# Patient Record
Sex: Female | Born: 1955 | Race: White | Hispanic: No | Marital: Married | Smoking: Never smoker
Health system: Southern US, Community
[De-identification: ages and names within clinical notes are randomized; demographics above are authoritative.]

## PROBLEM LIST (undated history)

## (undated) DIAGNOSIS — E785 Hyperlipidemia, unspecified: Secondary | ICD-10-CM

## (undated) DIAGNOSIS — Z87448 Personal history of other diseases of urinary system: Secondary | ICD-10-CM

## (undated) DIAGNOSIS — M810 Age-related osteoporosis without current pathological fracture: Secondary | ICD-10-CM

## (undated) DIAGNOSIS — R0789 Other chest pain: Secondary | ICD-10-CM

## (undated) DIAGNOSIS — I499 Cardiac arrhythmia, unspecified: Secondary | ICD-10-CM

## (undated) DIAGNOSIS — I2510 Atherosclerotic heart disease of native coronary artery without angina pectoris: Secondary | ICD-10-CM

## (undated) DIAGNOSIS — Z8489 Family history of other specified conditions: Secondary | ICD-10-CM

## (undated) DIAGNOSIS — C50919 Malignant neoplasm of unspecified site of unspecified female breast: Secondary | ICD-10-CM

## (undated) DIAGNOSIS — E041 Nontoxic single thyroid nodule: Secondary | ICD-10-CM

## (undated) DIAGNOSIS — D649 Anemia, unspecified: Secondary | ICD-10-CM

## (undated) DIAGNOSIS — I5189 Other ill-defined heart diseases: Secondary | ICD-10-CM

## (undated) DIAGNOSIS — N189 Chronic kidney disease, unspecified: Secondary | ICD-10-CM

## (undated) DIAGNOSIS — T883XXA Malignant hyperthermia due to anesthesia, initial encounter: Secondary | ICD-10-CM

## (undated) DIAGNOSIS — C801 Malignant (primary) neoplasm, unspecified: Secondary | ICD-10-CM

## (undated) DIAGNOSIS — R918 Other nonspecific abnormal finding of lung field: Secondary | ICD-10-CM

## (undated) DIAGNOSIS — Z85828 Personal history of other malignant neoplasm of skin: Secondary | ICD-10-CM

## (undated) DIAGNOSIS — Z87442 Personal history of urinary calculi: Secondary | ICD-10-CM

## (undated) DIAGNOSIS — M1990 Unspecified osteoarthritis, unspecified site: Secondary | ICD-10-CM

## (undated) DIAGNOSIS — E119 Type 2 diabetes mellitus without complications: Secondary | ICD-10-CM

## (undated) HISTORY — PX: COLONOSCOPY: SHX174

## (undated) HISTORY — DX: Personal history of other diseases of urinary system: Z87.448

## (undated) HISTORY — DX: Malignant neoplasm of unspecified site of unspecified female breast: C50.919

## (undated) HISTORY — DX: Personal history of other malignant neoplasm of skin: Z85.828

## (undated) HISTORY — DX: Other chest pain: R07.89

## (undated) HISTORY — DX: Other nonspecific abnormal finding of lung field: R91.8

## (undated) HISTORY — PX: TONSILLECTOMY: SUR1361

## (undated) HISTORY — DX: Other ill-defined heart diseases: I51.89

## (undated) HISTORY — DX: Atherosclerotic heart disease of native coronary artery without angina pectoris: I25.10

---

## 1998-04-04 ENCOUNTER — Ambulatory Visit (HOSPITAL_BASED_OUTPATIENT_CLINIC_OR_DEPARTMENT_OTHER): Admission: RE | Admit: 1998-04-04 | Discharge: 1998-04-04 | Payer: Self-pay | Admitting: Orthopedic Surgery

## 1999-03-07 ENCOUNTER — Emergency Department (HOSPITAL_COMMUNITY): Admission: EM | Admit: 1999-03-07 | Discharge: 1999-03-07 | Payer: Self-pay | Admitting: Emergency Medicine

## 1999-03-07 ENCOUNTER — Encounter: Payer: Self-pay | Admitting: Emergency Medicine

## 1999-04-30 ENCOUNTER — Encounter (INDEPENDENT_AMBULATORY_CARE_PROVIDER_SITE_OTHER): Payer: Self-pay | Admitting: Specialist

## 1999-04-30 ENCOUNTER — Ambulatory Visit (HOSPITAL_BASED_OUTPATIENT_CLINIC_OR_DEPARTMENT_OTHER): Admission: RE | Admit: 1999-04-30 | Discharge: 1999-04-30 | Payer: Self-pay | Admitting: Orthopedic Surgery

## 2001-02-08 ENCOUNTER — Other Ambulatory Visit: Admission: RE | Admit: 2001-02-08 | Discharge: 2001-02-08 | Payer: Self-pay | Admitting: Obstetrics & Gynecology

## 2001-11-14 ENCOUNTER — Encounter: Admission: RE | Admit: 2001-11-14 | Discharge: 2002-02-12 | Payer: Self-pay | Admitting: Internal Medicine

## 2002-04-04 ENCOUNTER — Other Ambulatory Visit: Admission: RE | Admit: 2002-04-04 | Discharge: 2002-04-04 | Payer: Self-pay | Admitting: Obstetrics & Gynecology

## 2002-04-09 ENCOUNTER — Encounter: Admission: RE | Admit: 2002-04-09 | Discharge: 2002-04-09 | Payer: Self-pay | Admitting: Gastroenterology

## 2002-04-09 ENCOUNTER — Encounter: Payer: Self-pay | Admitting: Obstetrics & Gynecology

## 2002-09-20 ENCOUNTER — Encounter: Payer: Self-pay | Admitting: Obstetrics & Gynecology

## 2002-09-20 ENCOUNTER — Encounter: Admission: RE | Admit: 2002-09-20 | Discharge: 2002-09-20 | Payer: Self-pay | Admitting: Obstetrics & Gynecology

## 2003-04-08 ENCOUNTER — Other Ambulatory Visit: Admission: RE | Admit: 2003-04-08 | Discharge: 2003-04-08 | Payer: Self-pay | Admitting: Obstetrics & Gynecology

## 2003-06-11 ENCOUNTER — Encounter: Admission: RE | Admit: 2003-06-11 | Discharge: 2003-06-11 | Payer: Self-pay | Admitting: Obstetrics & Gynecology

## 2003-06-17 ENCOUNTER — Encounter: Admission: RE | Admit: 2003-06-17 | Discharge: 2003-06-17 | Payer: Self-pay | Admitting: Obstetrics & Gynecology

## 2004-03-29 HISTORY — PX: CHOLECYSTECTOMY: SHX55

## 2004-03-29 HISTORY — PX: UMBILICAL HERNIA REPAIR: SHX2598

## 2004-05-18 ENCOUNTER — Other Ambulatory Visit: Admission: RE | Admit: 2004-05-18 | Discharge: 2004-05-18 | Payer: Self-pay | Admitting: Obstetrics & Gynecology

## 2004-06-11 ENCOUNTER — Encounter: Admission: RE | Admit: 2004-06-11 | Discharge: 2004-06-11 | Payer: Self-pay | Admitting: Obstetrics & Gynecology

## 2004-06-19 ENCOUNTER — Ambulatory Visit (HOSPITAL_COMMUNITY): Admission: RE | Admit: 2004-06-19 | Discharge: 2004-06-20 | Payer: Self-pay | Admitting: General Surgery

## 2004-06-19 ENCOUNTER — Encounter (INDEPENDENT_AMBULATORY_CARE_PROVIDER_SITE_OTHER): Payer: Self-pay | Admitting: *Deleted

## 2004-07-03 ENCOUNTER — Encounter: Admission: RE | Admit: 2004-07-03 | Discharge: 2004-07-03 | Payer: Self-pay | Admitting: Obstetrics & Gynecology

## 2004-07-27 ENCOUNTER — Encounter: Admission: RE | Admit: 2004-07-27 | Discharge: 2004-07-27 | Payer: Self-pay | Admitting: Family Medicine

## 2004-08-05 ENCOUNTER — Encounter: Admission: RE | Admit: 2004-08-05 | Discharge: 2004-08-05 | Payer: Self-pay | Admitting: Obstetrics & Gynecology

## 2005-02-03 ENCOUNTER — Encounter: Admission: RE | Admit: 2005-02-03 | Discharge: 2005-02-03 | Payer: Self-pay | Admitting: Obstetrics & Gynecology

## 2005-05-27 ENCOUNTER — Other Ambulatory Visit: Admission: RE | Admit: 2005-05-27 | Discharge: 2005-05-27 | Payer: Self-pay | Admitting: Obstetrics & Gynecology

## 2005-06-14 ENCOUNTER — Encounter: Admission: RE | Admit: 2005-06-14 | Discharge: 2005-06-14 | Payer: Self-pay | Admitting: Obstetrics & Gynecology

## 2006-06-20 ENCOUNTER — Encounter: Admission: RE | Admit: 2006-06-20 | Discharge: 2006-06-20 | Payer: Self-pay | Admitting: Obstetrics & Gynecology

## 2007-06-21 ENCOUNTER — Encounter: Admission: RE | Admit: 2007-06-21 | Discharge: 2007-06-21 | Payer: Self-pay | Admitting: Obstetrics & Gynecology

## 2008-06-21 ENCOUNTER — Encounter: Admission: RE | Admit: 2008-06-21 | Discharge: 2008-06-21 | Payer: Self-pay | Admitting: Obstetrics & Gynecology

## 2009-04-02 ENCOUNTER — Telehealth (INDEPENDENT_AMBULATORY_CARE_PROVIDER_SITE_OTHER): Payer: Self-pay | Admitting: *Deleted

## 2009-04-03 ENCOUNTER — Ambulatory Visit: Payer: Self-pay | Admitting: Internal Medicine

## 2009-04-03 ENCOUNTER — Encounter (HOSPITAL_COMMUNITY): Admission: RE | Admit: 2009-04-03 | Discharge: 2009-06-02 | Payer: Self-pay | Admitting: Internal Medicine

## 2009-04-03 ENCOUNTER — Ambulatory Visit: Payer: Self-pay

## 2009-04-03 ENCOUNTER — Encounter: Payer: Self-pay | Admitting: Cardiovascular Disease

## 2009-06-23 ENCOUNTER — Encounter: Admission: RE | Admit: 2009-06-23 | Discharge: 2009-06-23 | Payer: Self-pay | Admitting: Obstetrics & Gynecology

## 2010-03-29 HISTORY — PX: TRIGGER FINGER RELEASE: SHX641

## 2010-04-28 NOTE — Assessment & Plan Note (Signed)
Summary: Cardiology Nuclear Study  Nuclear Med Background Indications for Stress Test: Evaluation for Ischemia    History Comments: NO DOCUMENTED CAD  Symptoms: Chest Pain, Diaphoresis, Fatigue, Near Syncope, Palpitations, SOB  Symptoms Comments: CP>neck,(L) arm/shoulder and back; c/o near syncopal episode with palpitations.  Last episode of ZO:XWRU am, now, 4/10.   Nuclear Pre-Procedure Cardiac Risk Factors: Family History - CAD, Hypertension, IDDM Type 2, Lipids Caffeine/Decaff Intake: None NPO After: 8:00 PM Lungs: Clear IV 0.9% NS with Angio Cath: 22g     IV Site: (R) Hand IV Started by: Irean Hong RN Chest Size (in) 36     Cup Size D     Height (in): 66 Weight (lb): 177 BMI: 28.67 Tech Comments: No BP meds. taken today.  Full dose Levemir insulin last night, FBS today at 7:00 am was 244. (She ate a piece of pound cake before bed).  Nuclear Med Study 1 or 2 day study:  1 day     Stress Test Type:  Stress Reading MD:  Dietrich Pates, MD     Referring MD:  Guerry Bruin, MD Resting Radionuclide:  Technetium 56m Tetrofosmin     Resting Radionuclide Dose:  11.0 mCi  Stress Radionuclide:  Technetium 13m Tetrofosmin     Stress Radionuclide Dose:  33.0 mCi   Stress Protocol Exercise Time (min):  7:00 min     Max HR:  169 bpm     Predicted Max HR:  166 bpm  Max Systolic BP: 172 mm Hg     Percent Max HR:  101.81 %     METS: 8.5 Rate Pressure Product:  04540    Stress Test Technologist:  Rea [employer] CMA-N     Nuclear Technologist:  Burna Mortimer Deal RT-N  Rest Procedure  Myocardial perfusion imaging was performed at rest 45 minutes following the intravenous administration of Myoview Technetium 54m Tetrofosmin.  Stress Procedure  The patient exercised for seven minutes.  The patient stopped due to fatigue.  She c/o chest pain, "ache", at baseline, 4/10.  With exercise this increased to 7/10.  There were no diagnostic ST-T wave changes, only occasional PVC's with rare couplets.  She  did have a mild hypertenisve response to exercise, 168/100.   Myoview was injected at peak exercise and myocardial perfusion imaging was performed after a brief delay.  QPS Raw Data Images:  Soft tissue (diaphragm, breast) surround heart.  Stress images were motion corrected. Stress Images:  There is normal uptake in all areas. Rest Images:  Normal homogeneous uptake in all areas of the myocardium. Subtraction (SDS):  No evidence of ischemia. Transient Ischemic Dilatation:  .91  (Normal <1.22)  Lung/Heart Ratio:  .38  (Normal <0.45)  Quantitative Gated Spect Images QGS EDV:  64 ml QGS ESV:  18 ml QGS EF:  72 %   Overall Impression  Exercise Capacity: Good exercise capacity. BP Response: Normal blood pressure response. Clinical Symptoms: Chest pain, easing in recovery but not gone. ECG Impression: 1 to 2 mm flat ST depression in V3, V4 at near peak excercise.Normalizing by 17 sec recovery  Electrically positive but specificity limited. Overall Impression: Normal perfusion.

## 2010-04-28 NOTE — Progress Notes (Signed)
Summary: Nuclear Pre-Procedure  Phone Note Outgoing Call   Call placed by: Milana Na, EMT-P,  April 02, 2009 1:50 PM Summary of Call: Reviewed information on Myoview Information Sheet (see scanned document for further details).  Spoke with patient's son.     Nuclear Med Background Indications for Stress Test: Evaluation for Ischemia     Symptoms: Chest Pain  Symptoms Comments: Radiation to his neck and shoulders   Nuclear Pre-Procedure Cardiac Risk Factors: Family History - CAD, Hypertension, IDDM Type 2, Lipids  Nuclear Med Study Referring MD:  Guerry Bruin

## 2010-06-18 ENCOUNTER — Other Ambulatory Visit: Payer: Self-pay | Admitting: Obstetrics & Gynecology

## 2010-06-18 DIAGNOSIS — Z1231 Encounter for screening mammogram for malignant neoplasm of breast: Secondary | ICD-10-CM

## 2010-07-07 ENCOUNTER — Ambulatory Visit
Admission: RE | Admit: 2010-07-07 | Discharge: 2010-07-07 | Disposition: A | Discharge status: Home / Self Care | Payer: 59 | Source: Ambulatory Visit | Attending: Obstetrics & Gynecology | Admitting: Obstetrics & Gynecology

## 2010-07-07 DIAGNOSIS — Z1231 Encounter for screening mammogram for malignant neoplasm of breast: Secondary | ICD-10-CM

## 2010-08-14 NOTE — Op Note (Signed)
[facility]  Patient:    [name]                       MRN: [id number] Proc. Date: 04/30/99 Adm. Date:  [id number] Attending:  Ronne Binning Dictator:   Nicki Reaper, M.D. CC:         Two copies to the office                           Operative Report  PREOPERATIVE DIAGNOSIS:  Stenosing tenosynovitis, left middle, left little finger, with cyst left middle finger.  POSTOPERATIVE DIAGNOSIS:  Stenosing tenosynovitis, left middle, left little finger, with cyst left middle finger.  PROCEDURE:  Release A1 pulley, left middle and little fingers.  Excision cyst, eft middle finger.  SURGEON:  Dr. Merlyn Lot.  ASSISTANT:  RN.  ANESTHESIA:  Forearm based IV regional.  ANESTHESIOLOGIST:  Dr. Krista Blue.  HISTORY OF PRESENT ILLNESS:  The patient is a 55 year old female with a history of a stenosing tenosynovitis and cyst on her middle finger, stenosing tenosynovitis of little finger, which has not responded to conservative treatment.  DESCRIPTION OF PROCEDURE:  The patient was brought to the operating room where  forearm based IV regional anesthetic was carried out without difficulty.  She was prepped and draped using Betadine scrubbing solution with the left arm free. An oblique incision was made over the A1 pulley of the middle finger, carried down  through subcutaneous tissues, cyst was encountered, this was excised.  The A1 pulley was released.  The dissection carried distally.  A second cyst was present at the junction of the A1-A2 pulley.  This was excised and sent to pathology. he wound was irrigated.  Skin closed with interrupted 5-0 Nylon sutures.  A separate incision was then made over the little finger A1 pulley area, carried down through the subcutaneous tissues.  Bleeders again electrocauterized.  The dissection carried down, protecting neurovascular structures, as was done on the middle finger.  The A1 pulley was  identified.  This was released on the radial aspect. No further lesions were identified.  The finger was placed through a full range of  motion, no triggering was encountered, as was also the case on the middle finger. The wound was irrigated.  Skin closed with interrupted 5-0 Nylon sutures.  A sterile compressive dressing and splint was applied.  The patient tolerated the  procedure well, and was taken to the recovery room for observation in satisfactory condition.  FOLLOWUP:  She is discharged home to return to the [facility] [place] in one week.  DISCHARGE MEDICATIONS: 1. Vicodin. 2. Keflex. DD:  04/30/99 TD:  04/30/99 Job: [id number]

## 2010-08-14 NOTE — Op Note (Signed)
NAMESUSETTE, SEMINARA               ACCOUNT NO.:  [id number]   MEDICAL RECORD NO.:  [id number]          PATIENT TYPE:  OIB   LOCATION:  2899                         FACILITY:  [facility]   PHYSICIAN:  Gabrielle Dare. Janee Morn, M.D.DATE OF BIRTH:  [birth year]   DATE OF PROCEDURE:  06/19/2004  DATE OF DISCHARGE:                                 OPERATIVE REPORT   PREOPERATIVE DIAGNOSES:  1.  Symptomatic cholelithiasis.  2.  Umbilical hernia.   POSTOPERATIVE DIAGNOSES:  1.  Symptomatic cholelithiasis.  2.  Umbilical hernia.   PROCEDURES:  1.  Laparoscopic cholecystectomy.  2.  Repair of umbilical hernia.   SURGEON:  Gabrielle Dare. Janee Morn, M.D.   ASSISTANT:  Leonie Man, M.D.   ANESTHESIA:  General.   HISTORY OF PRESENT ILLNESS:  Patient is a 55 year old white female who I  evaluated in the office for biliary colic.  She was noted on ultrasound to  have small gallstones and cholesterolosis of the gallbladder wall.  She also  has symptomatic umbilical hernia which we plan to repair without mesh at the  same time due to the possible contamination of the mesh.  She understands  that there is a slightly high recurrence rate when umbilical hernias are  repaired without mesh and she presents for elective surgery.   DESCRIPTION OF PROCEDURE:  Informed consent was obtained.  The patient was  identified.  She received intravenous antibiotics.  She was brought to the  operating room.  General anesthesia was administered.  Her abdomen was  prepped and draped in a sterile fashion.  An infraumbilical incision was  made.  Subcutaneous tissues were dissected down revealing her umbilical  hernia.  The umbilical hernia sac was circumferentially dissected and then  carefully removed from the umbilical skin with sharp dissection.  The hernia  sac had some preperitoneal fat but no significant omental adhesions.  Hernia  sac was excised.  A 0 Vicryl pursestring suture was placed around the  opening and the  Hasson trocar was inserted into the abdomen.  The abdomen  was insufflated with carbon dioxide in standard fashion.  Under direct  vision, an 11 mm epigastric and two 5 mm lateral ports were placed.  Marcaine 0.25% with epinephrine was used at all port sites.  The subsequent  laparoscopic exploration revealed some filmy adhesions up to the anterior  abdominal wall and the right upper quadrant.  These were taken down  carefully with Bovie cautery and scissors easily revealing the gallbladder.  The dome of the gallbladder was retracted superomedially.  The infundibulum  was retracted inferolaterally.  There was a good bit of mild inflammation  and fibrosis of the peritoneum.  Dissection was begun laterally and  progressed medially freeing this thickened peritoneum and easily identifying  the cystic duct and anterior and posterior branch of the cystic artery were  also nicely identified.  The common bile duct was easily seen and the  dissection was kept away from that.  At this time, a large window was  created between the infundibulum of the gallbladder and cystic duct and the  liver.  Once  this was created with excellent visualization, further  dissection was accomplished on the anterior branch of the cystic artery.  The anterior branch of the cystic artery was clipped twice proximally and  once distally and subsequently a clip was placed on the infundibular cystic  duct junction.  Weston Brass was made in the cystic duct at that point, though it  was quite small and fibrotic.  A Reddick cholangiogram catheter was inserted  into the abdomen, but despite multiple attempts, we were not able to fit the  catheter into the very stenotic and fibrosed cystic duct.  There was  excellent anatomical visualization.  The cholangiogram was abandoned.  The  three clips were placed proximally on the cystic duct and it was divided.  The previously clipped anterior branch of the cystic artery was divided.  Further  dissection easily delineated the posterior branch of the cystic  artery which was clipped twice proximally, once distally and divided the  gallbladder which was taken off the liver bed with the Bovie cautery.  Several areas of the liver were cauterized.  Good excellent hemostasis.  The  gallbladder was placed in the EndoCatch bag and removed from the abdomen via  the infraumbilical port site.  The abdomen was copiously irrigated with two  liters of saline.  Liver bed was rechecked and hemostasis was assured.  Subsequently the ports were removed under direct vision.  Hasson trocar was  removed from the abdomen.  The umbilical hernia was subsequently repaired  with series of interrupted 0 Ethibond sutures achieving excellent closure of  the fascia.  The patient's umbilical skin was tacked back down to the  underlying fascia with 3-0 Vicryl suture in interrupted fashion and all four  wounds were copiously irrigated.  Some additional local anesthetic was  injected and the skin of each was closed with a running 4-0 Vicryl  subcuticular stitch.  Sponge, needle and instrument counts were correct.  Benzoin and Steri-Strips were applied to all wounds.  A small cotton ball  was also placed in the umbilicus.  Sterile dressings were applied to all and  the patient was taken to the recovery room in stable condition after  tolerating the procedure without any apparent complications.      BET/MEDQ  D:  06/19/2004  T:  06/19/2004  Job:  [id number]

## 2010-08-14 NOTE — Discharge Summary (Signed)
[name], [name]               ACCOUNT NO.:  [id number]   MEDICAL RECORD NO.:  [id number]          PATIENT TYPE:  OIB   LOCATION:  5735                         FACILITY:  [facility]   PHYSICIAN:  Gabrielle Dare. Janee Morn, M.D.DATE OF BIRTH:  09/03/1955   DATE OF ADMISSION:  06/19/2004  DATE OF DISCHARGE:  06/20/2004                                 DISCHARGE SUMMARY   DISCHARGE DIAGNOSES:  1.  Symptomatic cholelithiasis.  2.  Umbilical hernia.  3.  Status post laparoscopic cholecystectomy and repair of umbilical hernia.   HISTORY OF PRESENT ILLNESS:  The patient is a 55 year old female who I  evaluated in the office for biliary colic and a symptomatic umbilical  hernia.  She presented for elective cholecystectomy and repair of her  umbilical hernia.   HOSPITAL COURSE:  The patient underwent an uncomplicated laparoscopic  cholecystectomy and umbilical hernia repair.  Postoperatively, she remained  afebrile and hemodynamically stable.  She had one episode of emesis in the  evening postoperatively, but her nausea has resolved today, and she is  discharged home in stable condition on postoperative day #1.   DISCHARGE DIET:  Low-fat.   DISCHARGE ACTIVITY:  No lifting.   DISCHARGE MEDICATIONS:  1.  Percocet 5/325 1-2 every six hours as needed for pain.  2.  The patient is also to resume her usual home medications which include:      1.  Actos.      2.  Altace.      3.  Lantus insulin.      4.  Sliding scale with Novolog.   FOLLOW UP:  With myself in three weeks.      BET/MEDQ  D:  06/20/2004  T:  06/21/2004  Job:  [id number]

## 2011-06-01 ENCOUNTER — Other Ambulatory Visit: Payer: Self-pay | Admitting: Obstetrics & Gynecology

## 2011-06-01 DIAGNOSIS — Z1231 Encounter for screening mammogram for malignant neoplasm of breast: Secondary | ICD-10-CM

## 2011-07-09 ENCOUNTER — Ambulatory Visit
Admission: RE | Admit: 2011-07-09 | Discharge: 2011-07-09 | Disposition: A | Discharge status: Home / Self Care | Payer: 59 | Source: Ambulatory Visit | Attending: Obstetrics & Gynecology | Admitting: Obstetrics & Gynecology

## 2011-07-09 DIAGNOSIS — Z1231 Encounter for screening mammogram for malignant neoplasm of breast: Secondary | ICD-10-CM

## 2012-06-01 ENCOUNTER — Other Ambulatory Visit: Payer: Self-pay

## 2012-06-01 DIAGNOSIS — Z803 Family history of malignant neoplasm of breast: Secondary | ICD-10-CM

## 2012-06-01 DIAGNOSIS — Z1231 Encounter for screening mammogram for malignant neoplasm of breast: Secondary | ICD-10-CM

## 2012-07-10 ENCOUNTER — Ambulatory Visit
Admission: RE | Admit: 2012-07-10 | Discharge: 2012-07-10 | Disposition: A | Discharge status: Home / Self Care | Payer: 59 | Source: Ambulatory Visit

## 2012-07-10 DIAGNOSIS — Z1231 Encounter for screening mammogram for malignant neoplasm of breast: Secondary | ICD-10-CM

## 2012-07-10 DIAGNOSIS — Z803 Family history of malignant neoplasm of breast: Secondary | ICD-10-CM

## 2013-06-18 ENCOUNTER — Other Ambulatory Visit: Payer: Self-pay

## 2013-06-18 DIAGNOSIS — Z803 Family history of malignant neoplasm of breast: Secondary | ICD-10-CM

## 2013-06-18 DIAGNOSIS — Z1231 Encounter for screening mammogram for malignant neoplasm of breast: Secondary | ICD-10-CM

## 2013-07-11 ENCOUNTER — Ambulatory Visit
Admission: RE | Admit: 2013-07-11 | Discharge: 2013-07-11 | Disposition: A | Discharge status: Home / Self Care | Payer: 59 | Source: Ambulatory Visit

## 2013-07-11 DIAGNOSIS — Z803 Family history of malignant neoplasm of breast: Secondary | ICD-10-CM

## 2013-07-11 DIAGNOSIS — Z1231 Encounter for screening mammogram for malignant neoplasm of breast: Secondary | ICD-10-CM

## 2013-07-12 ENCOUNTER — Other Ambulatory Visit: Payer: Self-pay | Admitting: Obstetrics & Gynecology

## 2013-07-12 DIAGNOSIS — R928 Other abnormal and inconclusive findings on diagnostic imaging of breast: Secondary | ICD-10-CM

## 2013-07-25 ENCOUNTER — Ambulatory Visit
Admission: RE | Admit: 2013-07-25 | Discharge: 2013-07-25 | Disposition: A | Discharge status: Home / Self Care | Payer: 59 | Source: Ambulatory Visit | Attending: Obstetrics & Gynecology | Admitting: Obstetrics & Gynecology

## 2013-07-25 DIAGNOSIS — R928 Other abnormal and inconclusive findings on diagnostic imaging of breast: Secondary | ICD-10-CM

## 2014-02-06 ENCOUNTER — Ambulatory Visit (INDEPENDENT_AMBULATORY_CARE_PROVIDER_SITE_OTHER): Payer: Self-pay | Admitting: General Surgery

## 2014-03-11 ENCOUNTER — Encounter (HOSPITAL_BASED_OUTPATIENT_CLINIC_OR_DEPARTMENT_OTHER): Payer: Self-pay | Admitting: *Deleted

## 2014-03-11 NOTE — Progress Notes (Signed)
To come in fr ekg-bmet Pt sister had malig, hyperthermia once-she has never had it-never tested

## 2014-03-13 ENCOUNTER — Encounter (HOSPITAL_BASED_OUTPATIENT_CLINIC_OR_DEPARTMENT_OTHER)
Admission: RE | Admit: 2014-03-13 | Discharge: 2014-03-13 | Disposition: A | Discharge status: Home / Self Care | Payer: 59 | Source: Ambulatory Visit | Attending: General Surgery | Admitting: General Surgery

## 2014-03-13 DIAGNOSIS — Z833 Family history of diabetes mellitus: Secondary | ICD-10-CM | POA: Diagnosis not present

## 2014-03-13 DIAGNOSIS — M6208 Separation of muscle (nontraumatic), other site: Secondary | ICD-10-CM | POA: Diagnosis not present

## 2014-03-13 DIAGNOSIS — Z794 Long term (current) use of insulin: Secondary | ICD-10-CM | POA: Diagnosis not present

## 2014-03-13 DIAGNOSIS — Z86010 Personal history of colonic polyps: Secondary | ICD-10-CM | POA: Diagnosis not present

## 2014-03-13 DIAGNOSIS — Z9049 Acquired absence of other specified parts of digestive tract: Secondary | ICD-10-CM | POA: Diagnosis not present

## 2014-03-13 DIAGNOSIS — I493 Ventricular premature depolarization: Secondary | ICD-10-CM | POA: Diagnosis not present

## 2014-03-13 DIAGNOSIS — D171 Benign lipomatous neoplasm of skin and subcutaneous tissue of trunk: Secondary | ICD-10-CM | POA: Diagnosis present

## 2014-03-13 DIAGNOSIS — E119 Type 2 diabetes mellitus without complications: Secondary | ICD-10-CM | POA: Diagnosis not present

## 2014-03-13 DIAGNOSIS — Z8249 Family history of ischemic heart disease and other diseases of the circulatory system: Secondary | ICD-10-CM | POA: Diagnosis not present

## 2014-03-13 DIAGNOSIS — Z809 Family history of malignant neoplasm, unspecified: Secondary | ICD-10-CM | POA: Diagnosis not present

## 2014-03-13 DIAGNOSIS — Z8349 Family history of other endocrine, nutritional and metabolic diseases: Secondary | ICD-10-CM | POA: Diagnosis not present

## 2014-03-13 LAB — BASIC METABOLIC PANEL
ANION GAP: 14 (ref 5–15)
BUN: 9 mg/dL (ref 6–23)
CALCIUM: 10.2 mg/dL (ref 8.4–10.5)
CO2: 27 mEq/L (ref 19–32)
CREATININE: 0.41 mg/dL — AB (ref 0.50–1.10)
Chloride: 93 mEq/L — ABNORMAL LOW (ref 96–112)
GFR calc Af Amer: >90 mL/min (ref >90)
GFR calc non Af Amer: >90 mL/min (ref >90)
Glucose, Bld: 172 mg/dL — ABNORMAL HIGH (ref 70–99)
Potassium: 4.2 mEq/L (ref 3.7–5.3)
Sodium: 134 mEq/L — ABNORMAL LOW (ref 137–147)

## 2014-03-14 NOTE — H&P (Signed)
History of Present Illness [name] E. Grandville Silos MD; 02/06/2014 11:26 AM) Patient words: left abd wall mass.  The patient is a 58 year old female who presents with a soft tissue mass. She has a several year history of left lower quadrant abdominal wall subcutaneous mass. She is well known to me status post previous cholecystectomy and umbilical hernia repair. This mass has been gradually enlarging. Dr. Osborne [name] asked me to see her in consultation to consider removal. She has not had any overlying skin changes. It is intermittently painful.   Other Problems Patricia Pierini, LPN; 48/18/5631 49:70 AM) Diabetes Mellitus Umbilical Hernia Repair  Past Surgical History Patricia Pierini, LPN; 26/37/8588 50:27 AM) Cesarean Section - 1 Colon Polyp Removal - Colonoscopy Gallbladder Surgery - Laparoscopic Tonsillectomy  Diagnostic Studies History Patricia Pierini, LPN; 74/02/8785 76:72 AM) Colonoscopy within last year Mammogram within last year Pap Smear 1-5 years ago  Allergies Patricia Pierini, LPN; 09/47/0962 83:66 AM) No Known Drug Allergies11/01/2014  Medication History Patricia Pierini, LPN; 29/47/6546 50:35 AM) Levemir FlexTouch (Subcutaneous) Specific dose unknown - Active. NovoLOG (Subcutaneous) Specific dose unknown - Active. MetFORMIN HCl (Oral) Specific dose unknown - Active. Simvastatin (Oral) Specific dose unknown - Active. Ramipril (Oral) Specific dose unknown - Active. Alendronate Sodium (Oral) Specific dose unknown - Active. OneTouch Ultra Blue (In Vitro) Specific dose unknown - Active. Vitamin D (400UNIT Capsule, Oral) Active. (600 mg daily)  Social History [name] [place], LPN; 46/56/8127 51:70 AM) Caffeine use Carbonated beverages, Coffee, Tea. No alcohol use No drug use Tobacco use Never smoker.  Family History Patricia Pierini, LPN; 01/74/9449 67:59 AM) Breast Cancer Sister. Cancer Mother. Diabetes Mellitus Father. Heart Disease Mother. Heart  disease in female family member before age 35 Migraine Headache Mother. Respiratory Condition Father. Thyroid problems Daughter, Mother.  Pregnancy / Birth History Patricia Pierini, LPN; 16/38/4665 99:35 AM) Age at menarche 17 years. Age of menopause 51-55 Contraceptive History Intrauterine device, Oral contraceptives. Gravida 2 Irregular periods Maternal age 22-30 Para 2  Review of Systems [name] Smithey LPN; 70/17/7939 03:00 AM) General Not Present- Appetite Loss, Chills, Fatigue, Fever, Night Sweats, Weight Gain and Weight Loss. Skin Not Present- Change in Wart/Mole, Dryness, Hives, Jaundice, New Lesions, Non-Healing Wounds, Rash and Ulcer. HEENT Present- Wears glasses/contact lenses. Not Present- Earache, Hearing Loss, Hoarseness, Nose Bleed, Oral Ulcers, Ringing in the Ears, Seasonal Allergies, Sinus Pain, Sore Throat, Visual Disturbances and Yellow Eyes. Respiratory Not Present- Bloody sputum, Chronic Cough, Difficulty Breathing, Snoring and Wheezing. Breast Not Present- Breast Mass, Breast Pain, Nipple Discharge and Skin Changes. Cardiovascular Not Present- Chest Pain, Difficulty Breathing Lying Down, Leg Cramps, Palpitations, Rapid Heart Rate, Shortness of Breath and Swelling of Extremities. Gastrointestinal Not Present- Abdominal Pain, Bloating, Bloody Stool, Change in Bowel Habits, Chronic diarrhea, Constipation, Difficulty Swallowing, Excessive gas, Gets full quickly at meals, Hemorrhoids, Indigestion, Nausea, Rectal Pain and Vomiting. Female Genitourinary Not Present- Frequency, Nocturia, Painful Urination, Pelvic Pain and Urgency. Musculoskeletal Not Present- Back Pain, Joint Pain, Joint Stiffness, Muscle Pain, Muscle Weakness and Swelling of Extremities. Neurological Not Present- Decreased Memory, Fainting, Headaches, Numbness, Seizures, Tingling, Tremor, Trouble walking and Weakness. Psychiatric Not Present- Anxiety, Bipolar, Change in Sleep Pattern, Depression,  Fearful and Frequent crying. Endocrine Not Present- Cold Intolerance, Excessive Hunger, Hair Changes, Heat Intolerance, Hot flashes and New Diabetes. Hematology Not Present- Easy Bruising, Excessive bleeding, Gland problems, HIV and Persistent Infections.   Vitals [name] Smithey LPN; 92/33/0076 22:63 AM) 02/06/2014 11:07 AM Weight: 174 lb Height: 66in Body Surface Area: 1.92 m Body Mass Index: 28.08  kg/m Temp.: 97.33F(Oral)  Pulse: 80 (Regular)  Resp.: 18 (Unlabored)  BP: 124/82 (Sitting, Left Arm, Standard)    Physical Exam ([name] E. Grandville Silos MD; 02/06/2014 11:27 AM) General Mental Status-Alert. General Appearance-Consistent with stated age. Hydration-Well hydrated. Voice-Normal.  Head and Neck Head-normocephalic, atraumatic with no lesions or palpable masses.  Eye Eyeball - Bilateral-Extraocular movements intact. Sclera/Conjunctiva - Bilateral-No scleral icterus.  Chest and Lung Exam Chest and lung exam reveals -quiet, even and easy respiratory effort with no use of accessory muscles and on auscultation, normal breath sounds, no adventitious sounds and normal vocal resonance. Inspection Chest Wall - Normal. Back - normal.  Cardiovascular Cardiovascular examination reveals -on palpation PMI is normal in location and amplitude, no palpable S3 or S4. Normal cardiac borders., normal heart sounds, regular rate and rhythm with no murmurs, carotid auscultation reveals no bruits and normal pedal pulses bilaterally.  Abdomen Note: Soft, nontender, 5 x 3 cm soft tissue mass left lower quadrant without significant tenderness, rectus diastases is present   Musculoskeletal Normal Exam - Left-Upper Extremity Strength Normal and Lower Extremity Strength Normal. Normal Exam - Right-Upper Extremity Strength Normal, Lower Extremity Weakness.    Assessment & Plan [name] E. 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 Procedure, risks, benefits were discussed and she is agreeable. We will schedule in the near future.

## 2014-03-15 ENCOUNTER — Ambulatory Visit (HOSPITAL_BASED_OUTPATIENT_CLINIC_OR_DEPARTMENT_OTHER)
Admission: RE | Admit: 2014-03-15 | Discharge: 2014-03-15 | Disposition: A | Discharge status: Home / Self Care | Payer: 59 | Source: Ambulatory Visit | Attending: General Surgery | Admitting: General Surgery

## 2014-03-15 ENCOUNTER — Encounter (HOSPITAL_BASED_OUTPATIENT_CLINIC_OR_DEPARTMENT_OTHER)
Admission: RE | Disposition: A | Discharge status: Home / Self Care | Payer: Self-pay | Source: Ambulatory Visit | Attending: General Surgery

## 2014-03-15 ENCOUNTER — Ambulatory Visit (HOSPITAL_BASED_OUTPATIENT_CLINIC_OR_DEPARTMENT_OTHER): Payer: 59 | Admitting: Anesthesiology

## 2014-03-15 ENCOUNTER — Encounter (HOSPITAL_BASED_OUTPATIENT_CLINIC_OR_DEPARTMENT_OTHER): Payer: Self-pay | Admitting: *Deleted

## 2014-03-15 DIAGNOSIS — Z8249 Family history of ischemic heart disease and other diseases of the circulatory system: Secondary | ICD-10-CM | POA: Insufficient documentation

## 2014-03-15 DIAGNOSIS — I493 Ventricular premature depolarization: Secondary | ICD-10-CM | POA: Insufficient documentation

## 2014-03-15 DIAGNOSIS — Z809 Family history of malignant neoplasm, unspecified: Secondary | ICD-10-CM | POA: Insufficient documentation

## 2014-03-15 DIAGNOSIS — Z86010 Personal history of colonic polyps: Secondary | ICD-10-CM | POA: Insufficient documentation

## 2014-03-15 DIAGNOSIS — Z794 Long term (current) use of insulin: Secondary | ICD-10-CM | POA: Insufficient documentation

## 2014-03-15 DIAGNOSIS — Z833 Family history of diabetes mellitus: Secondary | ICD-10-CM | POA: Insufficient documentation

## 2014-03-15 DIAGNOSIS — Z9049 Acquired absence of other specified parts of digestive tract: Secondary | ICD-10-CM | POA: Insufficient documentation

## 2014-03-15 DIAGNOSIS — D171 Benign lipomatous neoplasm of skin and subcutaneous tissue of trunk: Secondary | ICD-10-CM | POA: Insufficient documentation

## 2014-03-15 DIAGNOSIS — M6208 Separation of muscle (nontraumatic), other site: Secondary | ICD-10-CM | POA: Insufficient documentation

## 2014-03-15 DIAGNOSIS — Z8349 Family history of other endocrine, nutritional and metabolic diseases: Secondary | ICD-10-CM | POA: Insufficient documentation

## 2014-03-15 DIAGNOSIS — E119 Type 2 diabetes mellitus without complications: Secondary | ICD-10-CM | POA: Insufficient documentation

## 2014-03-15 HISTORY — DX: Age-related osteoporosis without current pathological fracture: M81.0

## 2014-03-15 HISTORY — DX: Type 2 diabetes mellitus without complications: E11.9

## 2014-03-15 HISTORY — DX: Hyperlipidemia, unspecified: E78.5

## 2014-03-15 HISTORY — PX: LIPOMA EXCISION: SHX5283

## 2014-03-15 HISTORY — DX: Chronic kidney disease, unspecified: N18.9

## 2014-03-15 HISTORY — DX: Unspecified osteoarthritis, unspecified site: M19.90

## 2014-03-15 HISTORY — DX: Anemia, unspecified: D64.9

## 2014-03-15 HISTORY — DX: Family history of other specified conditions: Z84.89

## 2014-03-15 LAB — POCT HEMOGLOBIN-HEMACUE: Hemoglobin: 12.5 g/dL (ref 12.0–15.0)

## 2014-03-15 LAB — GLUCOSE, CAPILLARY
GLUCOSE-CAPILLARY: 181 mg/dL — AB (ref 70–99)
Glucose-Capillary: 159 mg/dL — ABNORMAL HIGH (ref 70–99)

## 2014-03-15 SURGERY — EXCISION LIPOMA
Anesthesia: General | Site: Abdomen | Laterality: Left

## 2014-03-15 MED ORDER — CHLORHEXIDINE GLUCONATE 4 % EX LIQD: 1.0000 "application " | Freq: Once | CUTANEOUS | Status: DC

## 2014-03-15 MED ORDER — CEFAZOLIN SODIUM-DEXTROSE 2-3 GM-% IV SOLR
2.0000 g | INTRAVENOUS | Status: AC
  Administered 2014-03-15: 2 g via INTRAVENOUS

## 2014-03-15 MED ORDER — LIDOCAINE HCL (CARDIAC) 20 MG/ML IV SOLN
INTRAVENOUS | Status: DC | PRN
  Administered 2014-03-15: 60 mg via INTRAVENOUS

## 2014-03-15 MED ORDER — BUPIVACAINE HCL (PF) 0.25 % IJ SOLN
INTRAMUSCULAR | Status: AC
  Filled 2014-03-15: qty 30

## 2014-03-15 MED ORDER — BUPIVACAINE-EPINEPHRINE 0.5% -1:200000 IJ SOLN
INTRAMUSCULAR | Status: DC | PRN
  Administered 2014-03-15: 20 mL

## 2014-03-15 MED ORDER — PROPOFOL 10 MG/ML IV BOLUS
INTRAVENOUS | Status: AC
  Filled 2014-03-15: qty 20

## 2014-03-15 MED ORDER — LIDOCAINE-EPINEPHRINE (PF) 1 %-1:200000 IJ SOLN
INTRAMUSCULAR | Status: AC
  Filled 2014-03-15: qty 10

## 2014-03-15 MED ORDER — LACTATED RINGERS IV SOLN
INTRAVENOUS | Status: DC
  Administered 2014-03-15: 07:00:00 via INTRAVENOUS

## 2014-03-15 MED ORDER — DEXAMETHASONE SODIUM PHOSPHATE 4 MG/ML IJ SOLN
INTRAMUSCULAR | Status: DC | PRN
  Administered 2014-03-15: 4 mg via INTRAVENOUS

## 2014-03-15 MED ORDER — OXYCODONE HCL 5 MG PO TABS: 5.0000 mg | ORAL_TABLET | Freq: Four times a day (QID) | ORAL | Status: DC | PRN

## 2014-03-15 MED ORDER — FENTANYL CITRATE 0.05 MG/ML IJ SOLN
INTRAMUSCULAR | Status: AC
  Filled 2014-03-15: qty 4

## 2014-03-15 MED ORDER — ONDANSETRON HCL 4 MG/2ML IJ SOLN
INTRAMUSCULAR | Status: DC | PRN
  Administered 2014-03-15: 4 mg via INTRAVENOUS

## 2014-03-15 MED ORDER — MIDAZOLAM HCL 5 MG/5ML IJ SOLN
INTRAMUSCULAR | Status: DC | PRN
  Administered 2014-03-15: 2 mg via INTRAVENOUS

## 2014-03-15 MED ORDER — HYDROMORPHONE HCL 1 MG/ML IJ SOLN: 0.2500 mg | INTRAMUSCULAR | Status: DC | PRN

## 2014-03-15 MED ORDER — MIDAZOLAM HCL 2 MG/2ML IJ SOLN: 1.0000 mg | INTRAMUSCULAR | Status: DC | PRN

## 2014-03-15 MED ORDER — FENTANYL CITRATE 0.05 MG/ML IJ SOLN: 50.0000 ug | INTRAMUSCULAR | Status: DC | PRN

## 2014-03-15 MED ORDER — MIDAZOLAM HCL 2 MG/2ML IJ SOLN
INTRAMUSCULAR | Status: AC
  Filled 2014-03-15: qty 2

## 2014-03-15 MED ORDER — CEFAZOLIN SODIUM-DEXTROSE 2-3 GM-% IV SOLR
INTRAVENOUS | Status: AC
  Filled 2014-03-15: qty 50

## 2014-03-15 MED ORDER — PROMETHAZINE HCL 25 MG/ML IJ SOLN: 6.2500 mg | INTRAMUSCULAR | Status: DC | PRN

## 2014-03-15 MED ORDER — PROPOFOL INFUSION 10 MG/ML OPTIME
INTRAVENOUS | Status: DC | PRN
  Administered 2014-03-15: 150 ug/kg/min via INTRAVENOUS

## 2014-03-15 MED ORDER — PROPOFOL 10 MG/ML IV BOLUS
INTRAVENOUS | Status: DC | PRN
  Administered 2014-03-15: 150 mg via INTRAVENOUS

## 2014-03-15 SURGICAL SUPPLY — 58 items
APL SKNCLS STERI-STRIP NONHPOA (GAUZE/BANDAGES/DRESSINGS)
BENZOIN TINCTURE PRP APPL 2/3 (GAUZE/BANDAGES/DRESSINGS) IMPLANT
BLADE CLIPPER SURG (BLADE) IMPLANT
BLADE HEX COATED 2.75 (ELECTRODE) ×2 IMPLANT
BLADE SURG 15 STRL LF DISP TIS (BLADE) ×1 IMPLANT
BLADE SURG 15 STRL SS (BLADE) ×2
CANISTER SUCT 1200ML W/VALVE (MISCELLANEOUS) IMPLANT
CHLORAPREP W/TINT 26ML (MISCELLANEOUS) ×2 IMPLANT
COVER BACK TABLE 60X90IN (DRAPES) ×2 IMPLANT
COVER MAYO STAND STRL (DRAPES) ×2 IMPLANT
DECANTER SPIKE VIAL GLASS SM (MISCELLANEOUS) ×2 IMPLANT
DRAPE PED LAPAROTOMY (DRAPES) ×2 IMPLANT
DRAPE U-SHAPE 76X120 STRL (DRAPES) IMPLANT
DRAPE UTILITY XL STRL (DRAPES) ×2 IMPLANT
ELECT REM PT RETURN 9FT ADLT (ELECTROSURGICAL) ×2
ELECTRODE REM PT RTRN 9FT ADLT (ELECTROSURGICAL) ×1 IMPLANT
GAUZE SPONGE 4X4 12PLY STRL (GAUZE/BANDAGES/DRESSINGS) IMPLANT
GLOVE BIO SURGEON STRL SZ 6.5 (GLOVE) ×1 IMPLANT
GLOVE BIO SURGEON STRL SZ8 (GLOVE) ×2 IMPLANT
GLOVE BIOGEL PI IND STRL 7.0 (GLOVE) IMPLANT
GLOVE BIOGEL PI IND STRL 8 (GLOVE) ×1 IMPLANT
GLOVE BIOGEL PI INDICATOR 7.0 (GLOVE) ×1
GLOVE BIOGEL PI INDICATOR 8 (GLOVE) ×1
GOWN STRL REUS W/ TWL LRG LVL3 (GOWN DISPOSABLE) ×1 IMPLANT
GOWN STRL REUS W/ TWL XL LVL3 (GOWN DISPOSABLE) ×1 IMPLANT
GOWN STRL REUS W/TWL LRG LVL3 (GOWN DISPOSABLE) ×2
GOWN STRL REUS W/TWL XL LVL3 (GOWN DISPOSABLE) ×2
LIQUID BAND (GAUZE/BANDAGES/DRESSINGS) IMPLANT
NDL HYPO 25X1 1.5 SAFETY (NEEDLE) ×1 IMPLANT
NDL PRECISIONGLIDE 27X1.5 (NEEDLE) IMPLANT
NEEDLE HYPO 25X1 1.5 SAFETY (NEEDLE) ×2 IMPLANT
NEEDLE PRECISIONGLIDE 27X1.5 (NEEDLE) IMPLANT
NS IRRIG 1000ML POUR BTL (IV SOLUTION) IMPLANT
PACK BASIN DAY SURGERY FS (CUSTOM PROCEDURE TRAY) ×2 IMPLANT
PENCIL BUTTON HOLSTER BLD 10FT (ELECTRODE) ×2 IMPLANT
SHEET MEDIUM DRAPE 40X70 STRL (DRAPES) IMPLANT
SLEEVE SCD COMPRESS KNEE MED (MISCELLANEOUS) IMPLANT
SPONGE GAUZE 4X4 12PLY STER LF (GAUZE/BANDAGES/DRESSINGS) ×2 IMPLANT
SPONGE LAP 4X18 X RAY DECT (DISPOSABLE) IMPLANT
STRIP CLOSURE SKIN 1/2X4 (GAUZE/BANDAGES/DRESSINGS) IMPLANT
SUT ETHILON 3 0 PS 1 (SUTURE) IMPLANT
SUT ETHILON 5 0 PS 2 18 (SUTURE) IMPLANT
SUT MON AB 3-0 SH 27 (SUTURE)
SUT MON AB 3-0 SH27 (SUTURE) IMPLANT
SUT MON AB 4-0 PC3 18 (SUTURE) IMPLANT
SUT SILK 2 0 FS (SUTURE) IMPLANT
SUT VIC AB 3-0 PS1 18 (SUTURE)
SUT VIC AB 3-0 PS1 18XBRD (SUTURE) IMPLANT
SUT VIC AB 3-0 SH 27 (SUTURE)
SUT VIC AB 3-0 SH 27X BRD (SUTURE) IMPLANT
SUT VIC AB 4-0 SH 18 (SUTURE) IMPLANT
SUT VIC AB 5-0 PS2 18 (SUTURE) IMPLANT
SYR BULB 3OZ (MISCELLANEOUS) IMPLANT
SYR CONTROL 10ML LL (SYRINGE) ×2 IMPLANT
TOWEL OR 17X24 6PK STRL BLUE (TOWEL DISPOSABLE) ×4 IMPLANT
TOWEL OR NON WOVEN STRL DISP B (DISPOSABLE) ×2 IMPLANT
TUBE CONNECTING 20X1/4 (TUBING) IMPLANT
YANKAUER SUCT BULB TIP NO VENT (SUCTIONS) IMPLANT

## 2014-03-15 NOTE — Transfer of Care (Signed)
Immediate Anesthesia Transfer of Care Note  Patient: [name]  Procedure(s) Performed: Procedure(s): EXCISION LIPOMA LEFT LOWER QUARDRANT ABDOMINAL WALL (Left)  Patient Location: PACU  Anesthesia Type:General  Level of Consciousness: sedated  Airway & Oxygen Therapy: Patient Spontanous Breathing and Patient connected to face mask oxygen  Post-op Assessment: Report given to PACU RN and Post -op Vital signs reviewed and stable  Post vital signs: Reviewed and stable  Complications: No apparent anesthesia complications

## 2014-03-15 NOTE — Anesthesia Preprocedure Evaluation (Addendum)
Anesthesia Evaluation  Patient identified by MRN, date of birth, ID band Patient awake    Reviewed: Allergy & Precautions, H&P , NPO status , Patient's Chart, lab work & pertinent test results  History of Anesthesia Complications (+) Family history of anesthesia reactionNegative for: history of anesthetic complications  Airway Mallampati: I   Neck ROM: Full    Dental  (+) Teeth Intact   Pulmonary neg pulmonary ROS,  breath sounds clear to auscultation        Cardiovascular negative cardio ROS  Rhythm:Regular Rate:Normal     Neuro/Psych negative neurological ROS  negative psych ROS   GI/Hepatic   Endo/Other  diabetes  Renal/GU      Musculoskeletal   Abdominal   Peds  Hematology   Anesthesia Other Findings   Reproductive/Obstetrics                           Anesthesia Physical Anesthesia Plan  ASA: II  Anesthesia Plan: General   Post-op Pain Management:    Induction: Intravenous  Airway Management Planned: LMA  Additional Equipment:   Intra-op Plan:   Post-operative Plan: Extubation in OR  Informed Consent: I have reviewed the patients History and Physical, chart, labs and discussed the procedure including the risks, benefits and alternatives for the proposed anesthesia with the patient or authorized representative who has indicated his/her understanding and acceptance.   Dental advisory given  Plan Discussed with: CRNA and Surgeon  Anesthesia Plan Comments:         Anesthesia Quick Evaluation

## 2014-03-15 NOTE — Interval H&P Note (Signed)
History and Physical Interval Note:  03/15/2014 7:17 AM  [name]  has presented today for surgery, with the diagnosis of lipoma left lower quadrant abdominal wall  The various methods of treatment have been discussed with the patient and family. After consideration of risks, benefits and other options for treatment, the patient has consented to  Procedure(s): [place] (Left) as a surgical intervention .  The patient's history has been reviewed, patient re-examined, site marked,no change in status, stable for surgery.  I have reviewed the patient's chart and labs.  Questions were answered to the patient's satisfaction.     Johniya Durfee E

## 2014-03-15 NOTE — Discharge Instructions (Signed)
°  Post Anesthesia Home Care Instructions  Activity: Get plenty of rest for the remainder of the day. A responsible adult should stay with you for 24 hours following the procedure.  For the next 24 hours, DO NOT: -Drive a car -Paediatric nurse -Drink alcoholic beverages -Take any medication unless instructed by your physician -Make any legal decisions or sign important papers.  Meals: Start with liquid foods such as gelatin or soup. Progress to regular foods as tolerated. Avoid greasy, spicy, heavy foods. If nausea and/or vomiting occur, drink only clear liquids until the nausea and/or vomiting subsides. Call your physician if vomiting continues.  Special Instructions/Symptoms: Your throat may feel dry or sore from the anesthesia or the breathing tube placed in your throat during surgery. If this causes discomfort, gargle with warm salt water. The discomfort should disappear within 24 hours.   Call your surgeon if you experience:   1.  Fever over 101.0. 2.  Inability to urinate. 3.  Nausea and/or vomiting. 4.  Extreme swelling or bruising at the surgical site. 5.  Continued bleeding from the incision. 6.  Increased pain, redness or drainage from the incision. 7.  Problems related to your pain medication. 8. Any change in color, movement and/or sensation 9. Any problems and/or concerns

## 2014-03-15 NOTE — Op Note (Signed)
03/15/2014  8:03 AM  PATIENT:  [name]  58 y.o. female  PRE-OPERATIVE DIAGNOSIS:  lipoma left lower quadrant abdominal wall  POST-OPERATIVE DIAGNOSIS:  lipoma left lower quadrant abdominal wall  PROCEDURE:  Procedure(s): EXCISION LIPOMA LEFT LOWER QUARDRANT ABDOMINAL WALL 5CM WITH LAYERED CLOSURE  SURGEON:  Surgeon(s): Georganna Skeans, MD  ASSISTANTS: none   ANESTHESIA:   local and general  EBL:  Total I/O In: 800 [I.V.:800] Out: -   BLOOD ADMINISTERED:none  DRAINS: none   SPECIMEN:  Excision  DISPOSITION OF SPECIMEN:  PATHOLOGY  COUNTS:  YES  DICTATION: .Dragon DictationWanda presents for excision of lipoma left lower quadrant abdominal wall. She was identified in the preop holding area. Informed consent was obtained. She received intravenous antibiotics. She was brought to the operating room and general anesthesia was administered by the anesthesia staff. Her abdomen was prepped and draped in a sterile fashion. We did time out procedure. Incision was made over the palpable mass. Subcutaneous tissues were dissected down revealing a lobulated and partly encapsulated fatty mass. It appeared to be a lipoma. It was circumferentially dissected out using cautery for good hemostasis. It did extend down to her fascia which was intact without any hernias. Once the mass was excised, hemostasis was ensured. Wound was irrigated. Local anesthetic was injected. Incision was then closed in layers with deep tissues approximated with interrupted 3-0 Vicryls. Skin was closed with running 4-0 Monocryl subcuticular followed by Dermabond. All counts were correct. Patient tolerated the procedure well without apparent complication and was taken recovery in stable condition.  PATIENT DISPOSITION:  PACU - hemodynamically stable.   Delay start of Pharmacological VTE agent (>24hrs) due to surgical blood loss or risk of bleeding:  no  Georganna Skeans, MD, MPH, FACS Pager: [phone]   12/18/20158:03 AM

## 2014-03-15 NOTE — Anesthesia Procedure Notes (Signed)
Procedure Name: LMA Insertion Date/Time: 03/15/2014 7:32 AM Performed by: Maryella Shivers Pre-anesthesia Checklist: Patient identified, Emergency Drugs available, Suction available and Patient being monitored Patient Re-evaluated:Patient Re-evaluated prior to inductionOxygen Delivery Method: Circle System Utilized Preoxygenation: Pre-oxygenation with 100% oxygen Intubation Type: IV induction Ventilation: Mask ventilation without difficulty LMA: LMA inserted LMA Size: 4.0 Number of attempts: 1 Airway Equipment and Method: bite block Placement Confirmation: positive ETCO2 Tube secured with: Tape Dental Injury: Teeth and Oropharynx as per pre-operative assessment

## 2014-03-15 NOTE — Anesthesia Postprocedure Evaluation (Signed)
  Anesthesia Post-op Note  Patient: [name]  Procedure(s) Performed: Procedure(s): EXCISION LIPOMA LEFT LOWER QUARDRANT ABDOMINAL WALL (Left)  Patient Location: PACU  Anesthesia Type:General  Level of Consciousness: awake and alert   Airway and Oxygen Therapy: Patient Spontanous Breathing  Post-op Pain: mild  Post-op Assessment: Post-op Vital signs reviewed  Post-op Vital Signs: stable  Last Vitals:  Filed Vitals:   03/15/14 0830  BP: 119/64  Pulse: 80  Temp:   Resp: 20    Complications: No apparent anesthesia complications

## 2014-03-18 ENCOUNTER — Encounter (HOSPITAL_BASED_OUTPATIENT_CLINIC_OR_DEPARTMENT_OTHER): Payer: Self-pay | Admitting: General Surgery

## 2014-03-26 ENCOUNTER — Other Ambulatory Visit: Payer: Self-pay | Admitting: Hematology & Oncology

## 2014-06-28 ENCOUNTER — Other Ambulatory Visit: Payer: Self-pay

## 2014-06-28 DIAGNOSIS — Z1231 Encounter for screening mammogram for malignant neoplasm of breast: Secondary | ICD-10-CM

## 2014-07-17 ENCOUNTER — Ambulatory Visit: Payer: Self-pay

## 2014-07-23 ENCOUNTER — Ambulatory Visit
Admission: RE | Admit: 2014-07-23 | Discharge: 2014-07-23 | Disposition: A | Discharge status: Home / Self Care | Payer: 59 | Source: Ambulatory Visit

## 2014-07-23 DIAGNOSIS — Z1231 Encounter for screening mammogram for malignant neoplasm of breast: Secondary | ICD-10-CM

## 2015-06-18 ENCOUNTER — Other Ambulatory Visit: Payer: Self-pay

## 2015-06-18 DIAGNOSIS — Z1231 Encounter for screening mammogram for malignant neoplasm of breast: Secondary | ICD-10-CM

## 2015-07-25 ENCOUNTER — Ambulatory Visit
Admission: RE | Admit: 2015-07-25 | Discharge: 2015-07-25 | Disposition: A | Discharge status: Home / Self Care | Payer: Commercial Managed Care - HMO | Source: Ambulatory Visit

## 2015-07-25 DIAGNOSIS — Z1231 Encounter for screening mammogram for malignant neoplasm of breast: Secondary | ICD-10-CM

## 2015-09-09 ENCOUNTER — Ambulatory Visit (INDEPENDENT_AMBULATORY_CARE_PROVIDER_SITE_OTHER): Payer: Commercial Managed Care - HMO | Admitting: Podiatry

## 2015-09-09 ENCOUNTER — Encounter: Payer: Self-pay | Admitting: Podiatry

## 2015-09-09 ENCOUNTER — Ambulatory Visit (INDEPENDENT_AMBULATORY_CARE_PROVIDER_SITE_OTHER): Payer: Commercial Managed Care - HMO

## 2015-09-09 VITALS — BP 138/81 | HR 82 | Resp 18

## 2015-09-09 DIAGNOSIS — R52 Pain, unspecified: Secondary | ICD-10-CM | POA: Diagnosis not present

## 2015-09-09 DIAGNOSIS — M722 Plantar fascial fibromatosis: Secondary | ICD-10-CM | POA: Diagnosis not present

## 2015-09-09 NOTE — Progress Notes (Signed)
   Subjective:    Patient ID: [name], female    DOB: 01/17/1956, 60 y.o.   MRN: [id number]  HPI  60 year old female presents the office they for concerns of a knot in the arch of her left foot for which he first noticed around Mother's Day. She states that she gets some occasional discomfort with deep pressure to the area but no other tenderness on daily basis. She denies stepping on any foreign object. She is unsure of how this started. She's had no recent treatment for this. She does have a history of plantar fasciitis. No other complaints at this time.   Review of Systems  All other systems reviewed and are negative.      Objective:   Physical Exam General: AAO x3, NAD  Dermatological: Skin is warm, dry and supple bilateral. Nails x 10 are well manicured; remaining integument appears unremarkable at this time. There are no open sores, no preulcerative lesions, no rash or signs of infection present.  Vascular: Dorsalis Pedis artery and Posterior Tibial artery pedal pulses are 2/4 bilateral with immedate capillary fill time. There is no pain with calf compression, swelling, warmth, erythema.   Neruologic: Grossly intact via light touch bilateral. Vibratory intact via tuning fork bilateral. Protective threshold with Semmes Wienstein monofilament intact to all pedal sites bilateral.  Musculoskeletal: There is a firm nodule present the medial band of plantar fascia within the arch of the foot was consistently with a plantar fibroma. There is slight tenderness to palpation on this area. There is no overlying skin discoloration or erythema. There is no skin breakdown. There is no fluctuance or crepitus. No other lesions identified.  Gait: Unassisted, Nonantalgic.      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 No evidence of acute fracture. No underlying foreign body. -Discussed steroid injection, verapamil cream and offloading. We will go ahead and start with a verapamil cream. She is regarding the beach. If symptoms continue will consider steroid injection. A compound cream is ordered today. -Follow up in 4 weeks if symptoms continue or sooner if any issues are to arise.  Celesta Gentile, DPM

## 2015-09-09 NOTE — Patient Instructions (Signed)
The mass is most likely a plantar fibroma

## 2015-10-21 ENCOUNTER — Encounter: Payer: Self-pay | Admitting: Podiatry

## 2015-10-21 ENCOUNTER — Ambulatory Visit (INDEPENDENT_AMBULATORY_CARE_PROVIDER_SITE_OTHER): Payer: Commercial Managed Care - HMO | Admitting: Podiatry

## 2015-10-21 DIAGNOSIS — M722 Plantar fascial fibromatosis: Secondary | ICD-10-CM

## 2015-10-21 NOTE — Patient Instructions (Signed)

## 2015-10-22 NOTE — Progress Notes (Signed)
Subjective: 60 year old female presents the also for follow-up evaluation of left foot plantar fibroma. She's been using the prep no cream daily. She says about the same size but is here be softer. Denies any pain to the area denies any surrounding redness or drainage or any swelling. Denies any systemic complaints such as fevers, chills, nausea, vomiting. No acute changes since last appointment, and no other complaints at this time.   Objective: AAO x3, NAD DP/PT pulses palpable bilaterally, CRT less than 3 seconds There is a nodule present on the medial band plantar fasciitis foot on her left foot. The mass his injury somewhat softer compared to prior appointments. Areas not mobile. There is no edema, erythema, increased warmth. No tenderness to palpation to the area. No edema, erythema, increase in warmth to bilateral lower extremities.  No open lesions or pre-ulcerative lesions.  No pain with calf compression, swelling, warmth, erythema  Assessment: Left foot plantar fibroma  Plan: -All treatment options discussed with the patient including all alternatives, risks, complications.  Patient elects to proceed with steroid injection into the left foot lesion. Under sterile skin preparation, a total of 2 cc of kenalog 10, 0.5% Marcaine plain, and 2% lidocaine plain were infiltrated into the symptomatic area without complication. A band-aid was applied. Patient tolerated the injection well without complication. Post-injection care with discussed with the patient. Discussed with the patient to ice the area over the next couple of days to help prevent a steroid flare. This was done to help further decrease the size of the lesion.  -Continue verapamil cream -Stretching exercises -Follow up as scheduled or sooner if needed. -Patient encouraged to call the office with any questions, concerns, change in symptoms.   Celesta Gentile, DPM

## 2015-11-18 ENCOUNTER — Encounter: Payer: Self-pay | Admitting: Podiatry

## 2015-11-18 ENCOUNTER — Ambulatory Visit (INDEPENDENT_AMBULATORY_CARE_PROVIDER_SITE_OTHER): Payer: Commercial Managed Care - HMO | Admitting: Podiatry

## 2015-11-18 ENCOUNTER — Ambulatory Visit: Payer: Commercial Managed Care - HMO | Admitting: Podiatry

## 2015-11-18 DIAGNOSIS — M722 Plantar fascial fibromatosis: Secondary | ICD-10-CM | POA: Diagnosis not present

## 2015-11-23 NOTE — Progress Notes (Signed)
Subjective: 60 year old female presents the also for follow-up evaluation of left foot plantar fibroma. She says the area is smaller compared to previous and she's having no pain to the area but does continue to be there. Denies any systemic complaints such as fevers, chills, nausea, vomiting. No acute changes since last appointment, and no other complaints at this time.   Objective: AAO x3, NAD DP/PT pulses palpable bilaterally, CRT less than 3 seconds There is a nodule present on the medial band plantar fasciitis foot on her left foot. There is no overlying edema, erythema and there is no tenderness. The mass has improved in size however somewhat still palpable although greatly improved. Plantar fascia intact. No edema, erythema, increase in warmth to bilateral lower extremities.  No open lesions or pre-ulcerative lesions.  No pain with calf compression, swelling, warmth, erythema  Assessment: Left foot plantar fibroma; resolving   Plan: -All treatment options discussed with the patient including all alternatives, risks, complications.  -Patient elects to proceed with steroid injection into the left foot lesion. Under sterile skin preparation, a total of 2 cc of kenalog 10, 0.5% Marcaine plain, and 2% lidocaine plain were infiltrated into the symptomatic area without complication. A band-aid was applied. Patient tolerated the injection well without complication. Post-injection care with discussed with the patient. Discussed with the patient to ice the area over the next couple of days to help prevent a steroid flare. This was done to help further decrease the size of the lesion.  -Continue verapamil cream -Stretching exercises -Follow upif symptoms do not resolve the next 4-6 weeks or sooner if needed. -Patient encouraged to call the office with any questions, concerns, change in symptoms.   Celesta Gentile, DPM

## 2016-04-28 DIAGNOSIS — K648 Other hemorrhoids: Secondary | ICD-10-CM | POA: Diagnosis not present

## 2016-05-12 DIAGNOSIS — K648 Other hemorrhoids: Secondary | ICD-10-CM | POA: Diagnosis not present

## 2016-05-26 DIAGNOSIS — K648 Other hemorrhoids: Secondary | ICD-10-CM | POA: Diagnosis not present

## 2016-06-14 ENCOUNTER — Other Ambulatory Visit: Payer: Self-pay | Admitting: Obstetrics & Gynecology

## 2016-06-14 DIAGNOSIS — Z1231 Encounter for screening mammogram for malignant neoplasm of breast: Secondary | ICD-10-CM

## 2016-06-28 DIAGNOSIS — D122 Benign neoplasm of ascending colon: Secondary | ICD-10-CM | POA: Diagnosis not present

## 2016-06-28 DIAGNOSIS — Z86010 Personal history of colonic polyps: Secondary | ICD-10-CM | POA: Diagnosis not present

## 2016-06-28 DIAGNOSIS — K635 Polyp of colon: Secondary | ICD-10-CM | POA: Diagnosis not present

## 2016-06-28 DIAGNOSIS — Z1211 Encounter for screening for malignant neoplasm of colon: Secondary | ICD-10-CM | POA: Diagnosis not present

## 2016-06-28 DIAGNOSIS — D126 Benign neoplasm of colon, unspecified: Secondary | ICD-10-CM | POA: Diagnosis not present

## 2016-07-26 ENCOUNTER — Ambulatory Visit
Admission: RE | Admit: 2016-07-26 | Discharge: 2016-07-26 | Disposition: A | Discharge status: Home / Self Care | Payer: Commercial Managed Care - HMO | Source: Ambulatory Visit | Attending: Obstetrics & Gynecology | Admitting: Obstetrics & Gynecology

## 2016-07-26 DIAGNOSIS — Z1231 Encounter for screening mammogram for malignant neoplasm of breast: Secondary | ICD-10-CM | POA: Diagnosis not present

## 2016-08-02 DIAGNOSIS — E1129 Type 2 diabetes mellitus with other diabetic kidney complication: Secondary | ICD-10-CM | POA: Diagnosis not present

## 2016-08-02 DIAGNOSIS — Z794 Long term (current) use of insulin: Secondary | ICD-10-CM | POA: Diagnosis not present

## 2016-08-02 DIAGNOSIS — M859 Disorder of bone density and structure, unspecified: Secondary | ICD-10-CM | POA: Diagnosis not present

## 2016-08-02 DIAGNOSIS — Z23 Encounter for immunization: Secondary | ICD-10-CM | POA: Diagnosis not present

## 2016-10-28 DIAGNOSIS — L259 Unspecified contact dermatitis, unspecified cause: Secondary | ICD-10-CM | POA: Diagnosis not present

## 2016-12-22 DIAGNOSIS — H25813 Combined forms of age-related cataract, bilateral: Secondary | ICD-10-CM | POA: Diagnosis not present

## 2016-12-22 DIAGNOSIS — E089 Diabetes mellitus due to underlying condition without complications: Secondary | ICD-10-CM | POA: Diagnosis not present

## 2016-12-22 DIAGNOSIS — E119 Type 2 diabetes mellitus without complications: Secondary | ICD-10-CM | POA: Diagnosis not present

## 2017-01-11 DIAGNOSIS — Z23 Encounter for immunization: Secondary | ICD-10-CM | POA: Diagnosis not present

## 2017-01-14 DIAGNOSIS — M65311 Trigger thumb, right thumb: Secondary | ICD-10-CM | POA: Diagnosis not present

## 2017-01-14 DIAGNOSIS — M79644 Pain in right finger(s): Secondary | ICD-10-CM | POA: Diagnosis not present

## 2017-01-17 DIAGNOSIS — Z23 Encounter for immunization: Secondary | ICD-10-CM | POA: Diagnosis not present

## 2017-01-27 DIAGNOSIS — M859 Disorder of bone density and structure, unspecified: Secondary | ICD-10-CM | POA: Diagnosis not present

## 2017-01-28 DIAGNOSIS — Z0000 Encounter for general adult medical examination without abnormal findings: Secondary | ICD-10-CM | POA: Diagnosis not present

## 2017-01-28 DIAGNOSIS — R82998 Other abnormal findings in urine: Secondary | ICD-10-CM | POA: Diagnosis not present

## 2017-02-04 DIAGNOSIS — D126 Benign neoplasm of colon, unspecified: Secondary | ICD-10-CM | POA: Diagnosis not present

## 2017-02-04 DIAGNOSIS — M859 Disorder of bone density and structure, unspecified: Secondary | ICD-10-CM | POA: Diagnosis not present

## 2017-02-04 DIAGNOSIS — E1129 Type 2 diabetes mellitus with other diabetic kidney complication: Secondary | ICD-10-CM | POA: Diagnosis not present

## 2017-02-04 DIAGNOSIS — Z0000 Encounter for general adult medical examination without abnormal findings: Secondary | ICD-10-CM | POA: Diagnosis not present

## 2017-02-18 DIAGNOSIS — Z1212 Encounter for screening for malignant neoplasm of rectum: Secondary | ICD-10-CM | POA: Diagnosis not present

## 2017-05-12 DIAGNOSIS — N3020 Other chronic cystitis without hematuria: Secondary | ICD-10-CM | POA: Diagnosis not present

## 2017-05-18 DIAGNOSIS — M65311 Trigger thumb, right thumb: Secondary | ICD-10-CM | POA: Diagnosis not present

## 2017-06-17 ENCOUNTER — Other Ambulatory Visit: Payer: Self-pay | Admitting: Obstetrics & Gynecology

## 2017-06-17 DIAGNOSIS — Z1231 Encounter for screening mammogram for malignant neoplasm of breast: Secondary | ICD-10-CM

## 2017-06-29 ENCOUNTER — Other Ambulatory Visit: Payer: Self-pay | Admitting: Orthopedic Surgery

## 2017-06-29 DIAGNOSIS — M65311 Trigger thumb, right thumb: Secondary | ICD-10-CM | POA: Diagnosis not present

## 2017-07-06 ENCOUNTER — Encounter (HOSPITAL_BASED_OUTPATIENT_CLINIC_OR_DEPARTMENT_OTHER): Payer: Self-pay | Admitting: *Deleted

## 2017-07-06 ENCOUNTER — Other Ambulatory Visit: Payer: Self-pay

## 2017-07-07 ENCOUNTER — Encounter (HOSPITAL_BASED_OUTPATIENT_CLINIC_OR_DEPARTMENT_OTHER)
Admission: RE | Admit: 2017-07-07 | Discharge: 2017-07-07 | Disposition: A | Discharge status: Home / Self Care | Payer: 59 | Source: Ambulatory Visit | Attending: Orthopedic Surgery | Admitting: Orthopedic Surgery

## 2017-07-07 DIAGNOSIS — Z01812 Encounter for preprocedural laboratory examination: Secondary | ICD-10-CM | POA: Insufficient documentation

## 2017-07-07 DIAGNOSIS — E109 Type 1 diabetes mellitus without complications: Secondary | ICD-10-CM | POA: Diagnosis not present

## 2017-07-07 DIAGNOSIS — Z01818 Encounter for other preprocedural examination: Secondary | ICD-10-CM | POA: Diagnosis not present

## 2017-07-07 LAB — BASIC METABOLIC PANEL
Anion gap: 10 (ref 5–15)
BUN: 8 mg/dL (ref 6–20)
CO2: 27 mmol/L (ref 22–32)
CREATININE: 0.56 mg/dL (ref 0.44–1.00)
Calcium: 9.7 mg/dL (ref 8.9–10.3)
Chloride: 101 mmol/L (ref 101–111)
GFR calc Af Amer: >60 mL/min (ref >60)
Glucose, Bld: 154 mg/dL — ABNORMAL HIGH (ref 65–99)
POTASSIUM: 4.4 mmol/L (ref 3.5–5.1)
SODIUM: 138 mmol/L (ref 135–145)

## 2017-07-13 NOTE — Anesthesia Preprocedure Evaluation (Addendum)
Anesthesia Evaluation  Patient identified by MRN, date of birth, ID band Patient awake  General Assessment Comment:Sister w hx of malignant hyperthermia  Reviewed: Allergy & Precautions, NPO status , Patient's Chart, lab work & pertinent test results  History of Anesthesia Complications (+) MALIGNANT HYPERTHERMIA and Family history of anesthesia reaction  Airway Mallampati: II  TM Distance: >3 FB Neck ROM: Full    Dental no notable dental hx.    Pulmonary neg pulmonary ROS,    Pulmonary exam normal breath sounds clear to auscultation       Cardiovascular Exercise Tolerance: Good negative cardio ROS Normal cardiovascular exam Rhythm:Regular Rate:Normal     Neuro/Psych negative neurological ROS     GI/Hepatic negative GI ROS, Neg liver ROS,   Endo/Other  negative endocrine ROSdiabetes, Well Controlled, Type 2  Renal/GU negative Renal ROS     Musculoskeletal   Abdominal   Peds negative pediatric ROS (+)  Hematology negative hematology ROS (+)   Anesthesia Other Findings   Reproductive/Obstetrics                            Anesthesia Physical Anesthesia Plan  ASA: II  Anesthesia Plan: Regional and MAC   Post-op Pain Management:    Induction: Intravenous  PONV Risk Score and Plan: Treatment may vary due to age or medical condition and Ondansetron  Airway Management Planned: Mask, Natural Airway and Nasal Cannula  Additional Equipment:   Intra-op Plan:   Post-operative Plan:   Informed Consent: I have reviewed the patients History and Physical, chart, labs and discussed the procedure including the risks, benefits and alternatives for the proposed anesthesia with the patient or authorized representative who has indicated his/her understanding and acceptance.     Plan Discussed with: CRNA  Anesthesia Plan Comments:        Anesthesia Quick Evaluation

## 2017-07-14 ENCOUNTER — Encounter (HOSPITAL_BASED_OUTPATIENT_CLINIC_OR_DEPARTMENT_OTHER): Payer: Self-pay | Admitting: *Deleted

## 2017-07-14 ENCOUNTER — Ambulatory Visit (HOSPITAL_BASED_OUTPATIENT_CLINIC_OR_DEPARTMENT_OTHER): Payer: 59 | Admitting: Anesthesiology

## 2017-07-14 ENCOUNTER — Ambulatory Visit (HOSPITAL_BASED_OUTPATIENT_CLINIC_OR_DEPARTMENT_OTHER)
Admission: RE | Admit: 2017-07-14 | Discharge: 2017-07-14 | Disposition: A | Discharge status: Home / Self Care | Payer: 59 | Source: Ambulatory Visit | Attending: Orthopedic Surgery | Admitting: Orthopedic Surgery

## 2017-07-14 ENCOUNTER — Encounter (HOSPITAL_BASED_OUTPATIENT_CLINIC_OR_DEPARTMENT_OTHER)
Admission: RE | Disposition: A | Discharge status: Home / Self Care | Payer: Self-pay | Source: Ambulatory Visit | Attending: Orthopedic Surgery

## 2017-07-14 ENCOUNTER — Other Ambulatory Visit: Payer: Self-pay

## 2017-07-14 DIAGNOSIS — Z794 Long term (current) use of insulin: Secondary | ICD-10-CM | POA: Diagnosis not present

## 2017-07-14 DIAGNOSIS — N189 Chronic kidney disease, unspecified: Secondary | ICD-10-CM | POA: Diagnosis not present

## 2017-07-14 DIAGNOSIS — M65311 Trigger thumb, right thumb: Secondary | ICD-10-CM | POA: Diagnosis not present

## 2017-07-14 DIAGNOSIS — E785 Hyperlipidemia, unspecified: Secondary | ICD-10-CM | POA: Diagnosis not present

## 2017-07-14 DIAGNOSIS — Z85828 Personal history of other malignant neoplasm of skin: Secondary | ICD-10-CM | POA: Diagnosis not present

## 2017-07-14 DIAGNOSIS — M1990 Unspecified osteoarthritis, unspecified site: Secondary | ICD-10-CM | POA: Insufficient documentation

## 2017-07-14 DIAGNOSIS — E119 Type 2 diabetes mellitus without complications: Secondary | ICD-10-CM | POA: Diagnosis not present

## 2017-07-14 DIAGNOSIS — E1122 Type 2 diabetes mellitus with diabetic chronic kidney disease: Secondary | ICD-10-CM | POA: Insufficient documentation

## 2017-07-14 DIAGNOSIS — M810 Age-related osteoporosis without current pathological fracture: Secondary | ICD-10-CM | POA: Diagnosis not present

## 2017-07-14 DIAGNOSIS — Z79899 Other long term (current) drug therapy: Secondary | ICD-10-CM | POA: Diagnosis not present

## 2017-07-14 DIAGNOSIS — M722 Plantar fascial fibromatosis: Secondary | ICD-10-CM | POA: Diagnosis not present

## 2017-07-14 HISTORY — PX: TRIGGER FINGER RELEASE: SHX641

## 2017-07-14 HISTORY — DX: Malignant (primary) neoplasm, unspecified: C80.1

## 2017-07-14 HISTORY — DX: Malignant hyperthermia due to anesthesia, initial encounter: T88.3XXA

## 2017-07-14 LAB — GLUCOSE, CAPILLARY
GLUCOSE-CAPILLARY: 62 mg/dL — AB (ref 65–99)
GLUCOSE-CAPILLARY: 155 mg/dL — AB (ref 65–99)
GLUCOSE-CAPILLARY: 96 mg/dL (ref 65–99)

## 2017-07-14 SURGERY — RELEASE, A1 PULLEY, FOR TRIGGER FINGER
Anesthesia: Monitor Anesthesia Care | Site: Hand | Laterality: Right

## 2017-07-14 MED ORDER — ONDANSETRON HCL 4 MG/2ML IJ SOLN
INTRAMUSCULAR | Status: DC | PRN
  Administered 2017-07-14: 4 mg via INTRAVENOUS

## 2017-07-14 MED ORDER — DEXTROSE 50 % IV SOLN
INTRAVENOUS | Status: AC
  Filled 2017-07-14: qty 50

## 2017-07-14 MED ORDER — FENTANYL CITRATE (PF) 100 MCG/2ML IJ SOLN
50.0000 ug | INTRAMUSCULAR | Status: DC | PRN
  Administered 2017-07-14 (×2): 25 ug via INTRAVENOUS
  Administered 2017-07-14: 50 ug via INTRAVENOUS

## 2017-07-14 MED ORDER — PROPOFOL 500 MG/50ML IV EMUL
INTRAVENOUS | Status: DC | PRN
  Administered 2017-07-14: 50 ug/kg/min via INTRAVENOUS

## 2017-07-14 MED ORDER — FENTANYL CITRATE (PF) 100 MCG/2ML IJ SOLN
INTRAMUSCULAR | Status: AC
  Filled 2017-07-14: qty 2

## 2017-07-14 MED ORDER — CHLORHEXIDINE GLUCONATE 4 % EX LIQD: 60.0000 mL | Freq: Once | CUTANEOUS | Status: DC

## 2017-07-14 MED ORDER — ROPIVACAINE HCL 5 MG/ML IJ SOLN
INTRAMUSCULAR | Status: DC | PRN
  Administered 2017-07-14: 30 mL via PERINEURAL

## 2017-07-14 MED ORDER — CEFAZOLIN SODIUM-DEXTROSE 2-4 GM/100ML-% IV SOLN: 2.0000 g | INTRAVENOUS | Status: DC

## 2017-07-14 MED ORDER — ONDANSETRON HCL 4 MG/2ML IJ SOLN
INTRAMUSCULAR | Status: AC
  Filled 2017-07-14: qty 2

## 2017-07-14 MED ORDER — SCOPOLAMINE 1 MG/3DAYS TD PT72: 1.0000 | MEDICATED_PATCH | Freq: Once | TRANSDERMAL | Status: DC | PRN

## 2017-07-14 MED ORDER — TRAMADOL HCL 50 MG PO TABS: 50.0000 mg | ORAL_TABLET | Freq: Four times a day (QID) | ORAL | 0 refills | Status: DC | PRN

## 2017-07-14 MED ORDER — MIDAZOLAM HCL 2 MG/2ML IJ SOLN
1.0000 mg | INTRAMUSCULAR | Status: DC | PRN
  Administered 2017-07-14 (×2): 1 mg via INTRAVENOUS

## 2017-07-14 MED ORDER — MIDAZOLAM HCL 2 MG/2ML IJ SOLN
INTRAMUSCULAR | Status: AC
Start: 2017-07-14 — End: 2017-07-14
  Filled 2017-07-14: qty 2

## 2017-07-14 MED ORDER — BUPIVACAINE HCL (PF) 0.25 % IJ SOLN
INTRAMUSCULAR | Status: AC
Start: 2017-07-14 — End: 2017-07-14
  Filled 2017-07-14: qty 30

## 2017-07-14 MED ORDER — LACTATED RINGERS IV SOLN
INTRAVENOUS | Status: DC
  Administered 2017-07-14: 07:00:00 via INTRAVENOUS

## 2017-07-14 MED ORDER — MIDAZOLAM HCL 2 MG/2ML IJ SOLN
INTRAMUSCULAR | Status: AC
  Filled 2017-07-14: qty 2

## 2017-07-14 SURGICAL SUPPLY — 36 items
BANDAGE COBAN STERILE 2 (GAUZE/BANDAGES/DRESSINGS) ×2 IMPLANT
BLADE SURG 15 STRL LF DISP TIS (BLADE) ×1 IMPLANT
BLADE SURG 15 STRL SS (BLADE) ×2
BNDG CMPR 9X4 STRL LF SNTH (GAUZE/BANDAGES/DRESSINGS) ×1
BNDG ESMARK 4X9 LF (GAUZE/BANDAGES/DRESSINGS) ×1 IMPLANT
CHLORAPREP W/TINT 26ML (MISCELLANEOUS) ×2 IMPLANT
CORD BIPOLAR FORCEPS 12FT (ELECTRODE) IMPLANT
COVER BACK TABLE 60X90IN (DRAPES) ×2 IMPLANT
COVER MAYO STAND STRL (DRAPES) ×2 IMPLANT
CUFF TOURNIQUET SINGLE 18IN (TOURNIQUET CUFF) ×1 IMPLANT
DECANTER SPIKE VIAL GLASS SM (MISCELLANEOUS) IMPLANT
DRAPE EXTREMITY T 121X128X90 (DRAPE) ×2 IMPLANT
DRAPE SURG 17X23 STRL (DRAPES) ×2 IMPLANT
GAUZE SPONGE 4X4 12PLY STRL (GAUZE/BANDAGES/DRESSINGS) ×2 IMPLANT
GAUZE XEROFORM 1X8 LF (GAUZE/BANDAGES/DRESSINGS) ×2 IMPLANT
GLOVE BIO SURGEON STRL SZ7 (GLOVE) ×1 IMPLANT
GLOVE BIOGEL PI IND STRL 7.0 (GLOVE) IMPLANT
GLOVE BIOGEL PI IND STRL 7.5 (GLOVE) IMPLANT
GLOVE BIOGEL PI IND STRL 8.5 (GLOVE) ×1 IMPLANT
GLOVE BIOGEL PI INDICATOR 7.0 (GLOVE) ×1
GLOVE BIOGEL PI INDICATOR 7.5 (GLOVE) ×1
GLOVE BIOGEL PI INDICATOR 8.5 (GLOVE) ×1
GLOVE SURG ORTHO 8.0 STRL STRW (GLOVE) ×2 IMPLANT
GOWN STRL REUS W/ TWL LRG LVL3 (GOWN DISPOSABLE) ×1 IMPLANT
GOWN STRL REUS W/TWL LRG LVL3 (GOWN DISPOSABLE) ×2
GOWN STRL REUS W/TWL XL LVL3 (GOWN DISPOSABLE) ×2 IMPLANT
NDL PRECISIONGLIDE 27X1.5 (NEEDLE) ×1 IMPLANT
NEEDLE PRECISIONGLIDE 27X1.5 (NEEDLE) ×2 IMPLANT
NS IRRIG 1000ML POUR BTL (IV SOLUTION) ×2 IMPLANT
PACK BASIN DAY SURGERY FS (CUSTOM PROCEDURE TRAY) ×2 IMPLANT
STOCKINETTE 4X48 STRL (DRAPES) ×2 IMPLANT
SUT ETHILON 4 0 PS 2 18 (SUTURE) ×2 IMPLANT
SYR BULB 3OZ (MISCELLANEOUS) ×2 IMPLANT
SYR CONTROL 10ML LL (SYRINGE) ×2 IMPLANT
TOWEL OR 17X24 6PK STRL BLUE (TOWEL DISPOSABLE) ×4 IMPLANT
UNDERPAD 30X30 (UNDERPADS AND DIAPERS) ×2 IMPLANT

## 2017-07-14 NOTE — Addendum Note (Signed)
Addendum  created 07/14/17 1300 by Verita Lamb, CRNA   Intraprocedure Meds edited

## 2017-07-14 NOTE — H&P (Signed)
[name] is an 62 y.o. female.   Chief Complaint: catching right thumb HPI: Jehan is a 62 year old right-hand-dominant former patient who  comes in complaining of catching of her right thumb this been going on for the past 2-3 weeks. She has no history of injury. She has tried ibuprofen which has given her some relief she has an aching pain which becomes sharp in nature if it catches. This has a VAS score of 8-9/10 when it catches. Otherwise she is only mild discomfort.She has had 2 injections to this. She has no history of injury. She has tried nonsteroidal anti-inflammatories without any significant relief. Veins of an aching pain which becomes sharp in nature when it catches. Takes it is significantly improved with the injections but continues to catch. He has had multiple for trigger fingers in the past.      She has a history of diabetes no history of thyroid problems arthritis gout. Family history is positive thyroid problems negative for diabetes arthritis and gout.      Past Medical History:  Diagnosis Date  . Anemia   . Arthritis   . Cancer [facility])    history of skin  . Chronic kidney disease    renal insuff  . Diabetes mellitus without complication ([place])   . Family history of adverse reaction to anesthesia    sister-malignant hyperthermia  . Hyperlipemia   . Malignant hyperthermia    sister had malginant hyperthermia  . Osteoporosis     Past Surgical History:  Procedure Laterality Date  . CESAREAN SECTION    . CHOLECYSTECTOMY  2006   lap choli with umb hernia  . COLONOSCOPY    . LIPOMA EXCISION Left 03/15/2014   Procedure: EXCISION LIPOMA LEFT LOWER QUARDRANT ABDOMINAL WALL;  Surgeon: Georganna Skeans, MD;  Location: [place];  Service: General;  Laterality: Left;  . TONSILLECTOMY    . TRIGGER FINGER RELEASE  2012   lt small,middle,long  . UMBILICAL HERNIA REPAIR  2006   with lap choli    Family History  Problem Relation Age of Onset  .  Breast cancer Sister   . Breast cancer Sister   . Heart disease Mother   . Diabetes Father   . Heart disease Other    Social History:  reports that she has never smoked. She has never used smokeless tobacco. She reports that she does not drink alcohol or use drugs.  Allergies: No Known Allergies  No medications prior to admission.    No results found for this or any previous visit (from the past 48 hour(s)).  No results found.   Pertinent items are noted in HPI.  Height 5\' 6"  (1.676 m), weight 79.4 kg (175 lb).  General appearance: alert, cooperative and appears stated age Head: Normocephalic, without obvious abnormality Neck: no JVD Resp: clear to auscultation bilaterally Cardio: regular rate and rhythm, S1, S2 normal, no murmur, click, rub or gallop GI: soft, non-tender; bowel sounds normal; no masses,  no organomegaly Extremities: catching right thumb Pulses: 2+ and symmetric Skin: Skin color, texture, turgor normal. No rashes or lesions Neurologic: Grossly normal Incision/Wound: na  Assessment/Plan Assessment:  1. Trigger finger of right thumb    Plan: We have discussed possible surgical release with her. Pre-peri-postoperative course are discussed along with risk applications. She is aware there is no guarantee to the surgery the possibility of infection recurrence injury to arteries nerves tendons complete relief symptoms and dystrophy. She is scheduled for release A1 pulley  right thumb as an outpatient under regional anesthesia.      Takerra Lupinacci R 07/14/2017, 4:53 AM

## 2017-07-14 NOTE — Op Note (Signed)
NAMEJENIN, [name]               ACCOUNT NO.:  [id number]  MEDICAL RECORD NO.:  [id number]  LOCATION:                                 FACILITY:  PHYSICIAN:  Daryll Brod, M.D.            DATE OF BIRTH:  DATE OF PROCEDURE:  07/14/2017 DATE OF DISCHARGE:                              OPERATIVE REPORT   PREOPERATIVE NOTE:  Stenosing tenosynovitis, right thumb.  POSTOPERATIVE DIAGNOSIS:  Stenosing tenosynovitis, right thumb.  OPERATION:  Release A1 pulley, right thumb.  SURGEON:  Daryll Brod, M.D.  ASSISTANT:  None.  ANESTHESIA:  Axillary block with IV sedation.  PLACE OF SURGERY:  [facility].  INDICATIONS:  The patient is a 62 year old female with a history of triggering of her right thumb.  This has not responded to conservative treatment.  She has elected to undergo surgical release of the A1 pulley.  Pre, peri, and postoperative course have been discussed along with risks and complications.  She is aware that there is no guarantee to the surgery, the possibility of infection; recurrence of injury to arteries, nerves, tendons; incomplete relief of symptoms; and dystrophy. In preoperative area, the patient is seen, the extremity marked by both patient and surgeon.  Antibiotics given.  DESCRIPTION OF PROCEDURE:  The patient was brought to the operating room, where following an axillary block and IV sedation was then afforded.  She was prepped and draped in a supine position with the right arm free.  A 3-minute dry time was allowed.  Time-out taken, confirming the patient and procedure.  After adequate anesthesia was afforded, the limb was exsanguinated with an Esmarch bandage. Tourniquet placed high on the arm was inflated to 250 mmHg.  A transverse incision was made over the A1 pulley of the right thumb, carried down through subcutaneous tissue.  Retractors were placed protecting neurovascular bundles radially and ulnarly.  The A1 pulley was identified, found to  be markedly thick and this was released on its radial aspect.  The oblique pulley was left intact.  The proximal tenosynovial tissue was then separated with blunt dissection.  The thumb placed through a full range motion and no further triggering was noted. The wound was copiously irrigated with saline.  The skin was closed with interrupted 4-0 nylon sutures.  A sterile compressive dressing with fingers free was applied.  On deflation of the tourniquet, all fingers immediately pinked.  She was taken to the recovery room for observation in satisfactory condition. She will be discharged home to return to the [place] in 1 week, on [facility].          ______________________________ Daryll Brod, M.D.     GK/MEDQ  D:  07/14/2017  T:  07/14/2017  Job:  [id number]

## 2017-07-14 NOTE — Progress Notes (Signed)
Assisted Dr. Houser with right, ultrasound guided, supraclavicular block. Side rails up, monitors on throughout procedure. See vital signs in flow sheet. Tolerated Procedure well. 

## 2017-07-14 NOTE — Transfer of Care (Signed)
Immediate Anesthesia Transfer of Care Note  Patient: [name]  Procedure(s) Performed: RELEASE TRIGGER FINGER/A-1 PULLEY RIGHT THUMB (Right Hand)  Patient Location: PACU  Anesthesia Type:MAC  Level of Consciousness: awake, alert  and oriented  Airway & Oxygen Therapy: Patient Spontanous Breathing and Patient connected to face mask oxygen  Post-op Assessment: Report given to RN and Post -op Vital signs reviewed and stable  Post vital signs: Reviewed and stable  Last Vitals:  Vitals Value Taken Time  BP 146/75 07/14/2017  8:49 AM  Temp    Pulse 76 07/14/2017  8:50 AM  Resp 12 07/14/2017  8:50 AM  SpO2 95 % 07/14/2017  8:50 AM  Vitals shown include unvalidated device data.  Last Pain:  Vitals:   07/14/17 0718  TempSrc: Oral  PainSc: 0-No pain         Complications: No apparent anesthesia complications

## 2017-07-14 NOTE — Anesthesia Postprocedure Evaluation (Signed)
Anesthesia Post Note  Patient: [name]  Procedure(s) Performed: RELEASE TRIGGER FINGER/A-1 PULLEY RIGHT THUMB (Right Hand)     Patient location during evaluation: PACU Anesthesia Type: Regional Level of consciousness: awake and alert Pain management: pain level controlled Vital Signs Assessment: post-procedure vital signs reviewed and stable Respiratory status: spontaneous breathing, nonlabored ventilation, respiratory function stable and patient connected to nasal cannula oxygen Cardiovascular status: stable and blood pressure returned to baseline Postop Assessment: no apparent nausea or vomiting Anesthetic complications: no    Last Vitals:  Vitals:   07/14/17 0913 07/14/17 0930  BP: (!) 152/82 123/70  Pulse: 69 68  Resp: 19 18  Temp:  36.5 C  SpO2: 94% 98%    Last Pain:  Vitals:   07/14/17 0930  TempSrc:   PainSc: 0-No pain                 Barnet Glasgow

## 2017-07-14 NOTE — Anesthesia Procedure Notes (Signed)
Anesthesia Regional Block: Supraclavicular block   Pre-Anesthetic Checklist: ,, timeout performed, Correct Patient, Correct Site, Correct Laterality, Correct Procedure, Correct Position, site marked, Risks and benefits discussed,  Surgical consent,  Pre-op evaluation,  At surgeon's request and post-op pain management  Laterality: Right  Prep: chloraprep       Needles:  Injection technique: Single-shot  Needle Type: Echogenic Stimulator Needle     Needle Length: 5cm  Needle Gauge: 21     Additional Needles:   Procedures:,,,, ultrasound used (permanent image in chart),,,,  Narrative:  Start time: 07/14/2017 8:00 AM End time: 07/14/2017 8:09 AM Injection made incrementally with aspirations every 5 mL.  Performed by: Personally  Anesthesiologist: Barnet Glasgow, MD

## 2017-07-14 NOTE — Op Note (Signed)
Other Dictation: Dictation Number [id number]

## 2017-07-14 NOTE — Discharge Instructions (Addendum)
[facility] Instructions °Hand Surgery ° °Wound Care: °Keep your hand elevated above the level of your heart.  Do not allow it to dangle by your side.  Keep the dressing dry and do not remove it unless your doctor advises you to do so.  He will usually change it at the time of your post-op visit.  Moving your fingers is advised to stimulate circulation but will depend on the site of your surgery.  If you have a splint applied, your doctor will advise you regarding movement. ° °Activity: °Do not drive or operate machinery today.  Rest today and then you may return to your normal activity and work as indicated by your physician. ° °Diet:  °Drink liquids today or eat a light diet.  You may resume a regular diet tomorrow.   ° °General expectations: °Pain for two to three days. °Fingers may become slightly swollen. ° °Call your doctor if any of the following occur: °Severe pain not relieved by pain medication. °Elevated temperature. °Dressing soaked with blood. °Inability to move fingers. °White or bluish color to fingers. ° ° °Post Anesthesia Home Care Instructions ° °Activity: °Get plenty of rest for the remainder of the day. A responsible individual must stay with you for 24 hours following the procedure.  °For the next 24 hours, DO NOT: °-Drive a car °-Operate machinery °-Drink alcoholic beverages °-Take any medication unless instructed by your physician °-Make any legal decisions or sign important papers. ° °Meals: °Start with liquid foods such as gelatin or soup. Progress to regular foods as tolerated. Avoid greasy, spicy, heavy foods. If nausea and/or vomiting occur, drink only clear liquids until the nausea and/or vomiting subsides. Call your physician if vomiting continues. ° °Special Instructions/Symptoms: °Your throat may feel dry or sore from the anesthesia or the breathing tube placed in your throat during surgery. If this causes discomfort, gargle with warm salt water. The discomfort should disappear within  24 hours. ° °If you had a scopolamine patch placed behind your ear for the management of post- operative nausea and/or vomiting: ° °1. The medication in the patch is effective for 72 hours, after which it should be removed.  Wrap patch in a tissue and discard in the trash. Wash hands thoroughly with soap and water. °2. You may remove the patch earlier than 72 hours if you experience unpleasant side effects which may include dry mouth, dizziness or visual disturbances. °3. Avoid touching the patch. Wash your hands with soap and water after contact with the patch. °  ° °Post Anesthesia Home Care Instructions ° °Activity: °Get plenty of rest for the remainder of the day. A responsible individual must stay with you for 24 hours following the procedure.  °For the next 24 hours, DO NOT: °-Drive a car °-Operate machinery °-Drink alcoholic beverages °-Take any medication unless instructed by your physician °-Make any legal decisions or sign important papers. ° °Meals: °Start with liquid foods such as gelatin or soup. Progress to regular foods as tolerated. Avoid greasy, spicy, heavy foods. If nausea and/or vomiting occur, drink only clear liquids until the nausea and/or vomiting subsides. Call your physician if vomiting continues. ° °Special Instructions/Symptoms: °Your throat may feel dry or sore from the anesthesia or the breathing tube placed in your throat during surgery. If this causes discomfort, gargle with warm salt water. The discomfort should disappear within 24 hours. ° °If you had a scopolamine patch placed behind your ear for the management of post- operative nausea and/or vomiting: ° °  vomiting:  1. The medication in the patch is effective for 72 hours, after which it should be removed.  Wrap patch in a tissue and discard in the trash. Wash hands thoroughly with soap and water. 2. You may remove the patch earlier than 72 hours if you experience unpleasant side effects which may include dry mouth, dizziness or visual  disturbances. 3. Avoid touching the patch. Wash your hands with soap and water after contact with the patch.    Regional Anesthesia Blocks  1. Numbness or the inability to move the "blocked" extremity may last from 3-48 hours after placement. The length of time depends on the medication injected and your individual response to the medication. If the numbness is not going away after 48 hours, call your surgeon.  2. The extremity that is blocked will need to be protected until the numbness is gone and the  Strength has returned. Because you cannot feel it, you will need to take extra care to avoid injury. Because it may be weak, you may have difficulty moving it or using it. You may not know what position it is in without looking at it while the block is in effect.  3. For blocks in the legs and feet, returning to weight bearing and walking needs to be done carefully. You will need to wait until the numbness is entirely gone and the strength has returned. You should be able to move your leg and foot normally before you try and bear weight or walk. You will need someone to be with you when you first try to ensure you do not fall and possibly risk injury.  4. Bruising and tenderness at the needle site are common side effects and will resolve in a few days.  5. Persistent numbness or new problems with movement should be communicated to the surgeon or the [facility] ([phone])/ [facility] ([phone]).  

## 2017-07-14 NOTE — Brief Op Note (Signed)
07/14/2017  8:51 AM  PATIENT:  [name]  62 y.o. female  PRE-OPERATIVE DIAGNOSIS:  TRIGGER RIGHT THUMB  POST-OPERATIVE DIAGNOSIS:  TRIGGER RIGHT THUMB  PROCEDURE:  Procedure(s): RELEASE TRIGGER FINGER/A-1 PULLEY RIGHT THUMB (Right)  SURGEON:  Surgeon(s) and Role:    * Daryll Brod, MD - Primary  PHYSICIAN ASSISTANT:   ASSISTANTS: none   ANESTHESIA:   regional and IV sedation  EBL:  noneBLOOD ADMINISTERED:none  DRAINS: none   LOCAL MEDICATIONS USED:  NONE  SPECIMEN:  No Specimen  DISPOSITION OF SPECIMEN:  N/A  COUNTS:  YES  TOURNIQUET:   Total Tourniquet Time Documented: Upper Arm (Right) - 7 minutes Total: Upper Arm (Right) - 7 minutes   DICTATION: .Other Dictation: Dictation Number [id number]  PLAN OF CARE: Discharge to home after PACU  PATIENT DISPOSITION:  PACU - hemodynamically stable.

## 2017-07-18 ENCOUNTER — Encounter (HOSPITAL_BASED_OUTPATIENT_CLINIC_OR_DEPARTMENT_OTHER): Payer: Self-pay | Admitting: Orthopedic Surgery

## 2017-07-27 ENCOUNTER — Ambulatory Visit
Admission: RE | Admit: 2017-07-27 | Discharge: 2017-07-27 | Disposition: A | Discharge status: Home / Self Care | Payer: 59 | Source: Ambulatory Visit | Attending: Obstetrics & Gynecology | Admitting: Obstetrics & Gynecology

## 2017-07-27 DIAGNOSIS — Z1231 Encounter for screening mammogram for malignant neoplasm of breast: Secondary | ICD-10-CM | POA: Diagnosis not present

## 2017-07-28 DIAGNOSIS — Z01419 Encounter for gynecological examination (general) (routine) without abnormal findings: Secondary | ICD-10-CM | POA: Diagnosis not present

## 2017-07-28 DIAGNOSIS — Z124 Encounter for screening for malignant neoplasm of cervix: Secondary | ICD-10-CM | POA: Diagnosis not present

## 2017-08-09 DIAGNOSIS — Z794 Long term (current) use of insulin: Secondary | ICD-10-CM | POA: Diagnosis not present

## 2017-08-09 DIAGNOSIS — E1129 Type 2 diabetes mellitus with other diabetic kidney complication: Secondary | ICD-10-CM | POA: Diagnosis not present

## 2017-08-09 DIAGNOSIS — E162 Hypoglycemia, unspecified: Secondary | ICD-10-CM | POA: Diagnosis not present

## 2017-12-28 DIAGNOSIS — E109 Type 1 diabetes mellitus without complications: Secondary | ICD-10-CM | POA: Diagnosis not present

## 2017-12-28 DIAGNOSIS — H524 Presbyopia: Secondary | ICD-10-CM | POA: Diagnosis not present

## 2018-02-02 DIAGNOSIS — Z0000 Encounter for general adult medical examination without abnormal findings: Secondary | ICD-10-CM | POA: Diagnosis not present

## 2018-02-02 DIAGNOSIS — M859 Disorder of bone density and structure, unspecified: Secondary | ICD-10-CM | POA: Diagnosis not present

## 2018-02-02 DIAGNOSIS — R82998 Other abnormal findings in urine: Secondary | ICD-10-CM | POA: Diagnosis not present

## 2018-02-09 DIAGNOSIS — Z794 Long term (current) use of insulin: Secondary | ICD-10-CM | POA: Diagnosis not present

## 2018-02-09 DIAGNOSIS — E1129 Type 2 diabetes mellitus with other diabetic kidney complication: Secondary | ICD-10-CM | POA: Diagnosis not present

## 2018-02-09 DIAGNOSIS — E162 Hypoglycemia, unspecified: Secondary | ICD-10-CM | POA: Diagnosis not present

## 2018-02-09 DIAGNOSIS — Z0000 Encounter for general adult medical examination without abnormal findings: Secondary | ICD-10-CM | POA: Diagnosis not present

## 2018-02-09 DIAGNOSIS — Z1389 Encounter for screening for other disorder: Secondary | ICD-10-CM | POA: Diagnosis not present

## 2018-02-09 DIAGNOSIS — Z23 Encounter for immunization: Secondary | ICD-10-CM | POA: Diagnosis not present

## 2018-02-10 DIAGNOSIS — Z1212 Encounter for screening for malignant neoplasm of rectum: Secondary | ICD-10-CM | POA: Diagnosis not present

## 2018-02-13 DIAGNOSIS — D2261 Melanocytic nevi of right upper limb, including shoulder: Secondary | ICD-10-CM | POA: Diagnosis not present

## 2018-02-13 DIAGNOSIS — D225 Melanocytic nevi of trunk: Secondary | ICD-10-CM | POA: Diagnosis not present

## 2018-02-13 DIAGNOSIS — D485 Neoplasm of uncertain behavior of skin: Secondary | ICD-10-CM | POA: Diagnosis not present

## 2018-02-13 DIAGNOSIS — L821 Other seborrheic keratosis: Secondary | ICD-10-CM | POA: Diagnosis not present

## 2018-02-13 DIAGNOSIS — D2262 Melanocytic nevi of left upper limb, including shoulder: Secondary | ICD-10-CM | POA: Diagnosis not present

## 2018-04-06 DIAGNOSIS — L720 Epidermal cyst: Secondary | ICD-10-CM | POA: Diagnosis not present

## 2018-04-26 DIAGNOSIS — R8290 Unspecified abnormal findings in urine: Secondary | ICD-10-CM | POA: Diagnosis not present

## 2018-05-31 DIAGNOSIS — E1129 Type 2 diabetes mellitus with other diabetic kidney complication: Secondary | ICD-10-CM | POA: Diagnosis not present

## 2018-05-31 DIAGNOSIS — E7800 Pure hypercholesterolemia, unspecified: Secondary | ICD-10-CM | POA: Diagnosis not present

## 2018-05-31 DIAGNOSIS — M546 Pain in thoracic spine: Secondary | ICD-10-CM | POA: Diagnosis not present

## 2018-05-31 DIAGNOSIS — R079 Chest pain, unspecified: Secondary | ICD-10-CM | POA: Diagnosis not present

## 2018-05-31 DIAGNOSIS — I10 Essential (primary) hypertension: Secondary | ICD-10-CM | POA: Diagnosis not present

## 2018-05-31 DIAGNOSIS — R0609 Other forms of dyspnea: Secondary | ICD-10-CM | POA: Diagnosis not present

## 2018-06-06 DIAGNOSIS — R350 Frequency of micturition: Secondary | ICD-10-CM | POA: Diagnosis not present

## 2018-06-06 DIAGNOSIS — N390 Urinary tract infection, site not specified: Secondary | ICD-10-CM | POA: Diagnosis not present

## 2018-06-06 DIAGNOSIS — N3020 Other chronic cystitis without hematuria: Secondary | ICD-10-CM | POA: Diagnosis not present

## 2018-06-06 DIAGNOSIS — B9620 Unspecified Escherichia coli [E. coli] as the cause of diseases classified elsewhere: Secondary | ICD-10-CM | POA: Diagnosis not present

## 2018-06-14 ENCOUNTER — Ambulatory Visit: Payer: 59 | Admitting: Cardiology

## 2018-06-15 ENCOUNTER — Observation Stay (HOSPITAL_BASED_OUTPATIENT_CLINIC_OR_DEPARTMENT_OTHER)
Admit: 2018-06-15 | Discharge: 2018-06-15 | Disposition: A | Discharge status: Home / Self Care | Payer: 59 | Attending: Physician Assistant | Admitting: Physician Assistant

## 2018-06-15 ENCOUNTER — Observation Stay
Admission: EM | Admit: 2018-06-15 | Discharge: 2018-06-16 | Disposition: A | Discharge status: Home / Self Care | Payer: 59 | Attending: Internal Medicine | Admitting: Internal Medicine

## 2018-06-15 ENCOUNTER — Encounter: Payer: Self-pay | Admitting: Emergency Medicine

## 2018-06-15 ENCOUNTER — Emergency Department: Payer: 59

## 2018-06-15 ENCOUNTER — Other Ambulatory Visit: Payer: 59

## 2018-06-15 ENCOUNTER — Other Ambulatory Visit: Payer: Self-pay

## 2018-06-15 DIAGNOSIS — M1990 Unspecified osteoarthritis, unspecified site: Secondary | ICD-10-CM | POA: Diagnosis not present

## 2018-06-15 DIAGNOSIS — N189 Chronic kidney disease, unspecified: Secondary | ICD-10-CM | POA: Insufficient documentation

## 2018-06-15 DIAGNOSIS — E876 Hypokalemia: Secondary | ICD-10-CM | POA: Diagnosis not present

## 2018-06-15 DIAGNOSIS — M25511 Pain in right shoulder: Secondary | ICD-10-CM | POA: Diagnosis not present

## 2018-06-15 DIAGNOSIS — D649 Anemia, unspecified: Secondary | ICD-10-CM | POA: Diagnosis not present

## 2018-06-15 DIAGNOSIS — M810 Age-related osteoporosis without current pathological fracture: Secondary | ICD-10-CM | POA: Insufficient documentation

## 2018-06-15 DIAGNOSIS — E041 Nontoxic single thyroid nodule: Secondary | ICD-10-CM | POA: Diagnosis not present

## 2018-06-15 DIAGNOSIS — R911 Solitary pulmonary nodule: Secondary | ICD-10-CM | POA: Diagnosis not present

## 2018-06-15 DIAGNOSIS — I129 Hypertensive chronic kidney disease with stage 1 through stage 4 chronic kidney disease, or unspecified chronic kidney disease: Secondary | ICD-10-CM | POA: Insufficient documentation

## 2018-06-15 DIAGNOSIS — Z85828 Personal history of other malignant neoplasm of skin: Secondary | ICD-10-CM | POA: Insufficient documentation

## 2018-06-15 DIAGNOSIS — I340 Nonrheumatic mitral (valve) insufficiency: Secondary | ICD-10-CM | POA: Diagnosis not present

## 2018-06-15 DIAGNOSIS — Z7982 Long term (current) use of aspirin: Secondary | ICD-10-CM | POA: Diagnosis not present

## 2018-06-15 DIAGNOSIS — E785 Hyperlipidemia, unspecified: Secondary | ICD-10-CM | POA: Insufficient documentation

## 2018-06-15 DIAGNOSIS — I700 Atherosclerosis of aorta: Secondary | ICD-10-CM | POA: Insufficient documentation

## 2018-06-15 DIAGNOSIS — I25110 Atherosclerotic heart disease of native coronary artery with unstable angina pectoris: Principal | ICD-10-CM | POA: Insufficient documentation

## 2018-06-15 DIAGNOSIS — E1122 Type 2 diabetes mellitus with diabetic chronic kidney disease: Secondary | ICD-10-CM | POA: Diagnosis not present

## 2018-06-15 DIAGNOSIS — Z8249 Family history of ischemic heart disease and other diseases of the circulatory system: Secondary | ICD-10-CM | POA: Diagnosis not present

## 2018-06-15 DIAGNOSIS — I361 Nonrheumatic tricuspid (valve) insufficiency: Secondary | ICD-10-CM

## 2018-06-15 DIAGNOSIS — I200 Unstable angina: Secondary | ICD-10-CM | POA: Diagnosis present

## 2018-06-15 DIAGNOSIS — Z79899 Other long term (current) drug therapy: Secondary | ICD-10-CM | POA: Insufficient documentation

## 2018-06-15 DIAGNOSIS — Z794 Long term (current) use of insulin: Secondary | ICD-10-CM | POA: Diagnosis not present

## 2018-06-15 DIAGNOSIS — R000 Tachycardia, unspecified: Secondary | ICD-10-CM | POA: Diagnosis not present

## 2018-06-15 DIAGNOSIS — E119 Type 2 diabetes mellitus without complications: Secondary | ICD-10-CM | POA: Diagnosis not present

## 2018-06-15 DIAGNOSIS — R0789 Other chest pain: Secondary | ICD-10-CM | POA: Diagnosis not present

## 2018-06-15 DIAGNOSIS — R079 Chest pain, unspecified: Secondary | ICD-10-CM | POA: Diagnosis not present

## 2018-06-15 DIAGNOSIS — I2510 Atherosclerotic heart disease of native coronary artery without angina pectoris: Secondary | ICD-10-CM | POA: Diagnosis not present

## 2018-06-15 LAB — COMPREHENSIVE METABOLIC PANEL
ALT: 19 U/L (ref 0–44)
AST: 18 U/L (ref 15–41)
Albumin: 4.2 g/dL (ref 3.5–5.0)
Alkaline Phosphatase: 54 U/L (ref 38–126)
Anion gap: 10 (ref 5–15)
BUN: 10 mg/dL (ref 8–23)
CHLORIDE: 98 mmol/L (ref 98–111)
CO2: 27 mmol/L (ref 22–32)
Calcium: 8.8 mg/dL — ABNORMAL LOW (ref 8.9–10.3)
Creatinine, Ser: 0.52 mg/dL (ref 0.44–1.00)
Glucose, Bld: 127 mg/dL — ABNORMAL HIGH (ref 70–99)
POTASSIUM: 3.3 mmol/L — AB (ref 3.5–5.1)
Sodium: 135 mmol/L (ref 135–145)
TOTAL PROTEIN: 7.1 g/dL (ref 6.5–8.1)
Total Bilirubin: 0.5 mg/dL (ref 0.3–1.2)

## 2018-06-15 LAB — CBC WITH DIFFERENTIAL/PLATELET
Abs Immature Granulocytes: 0.06 10*3/uL (ref 0.00–0.07)
BASOS PCT: 1 %
Basophils Absolute: 0.1 10*3/uL (ref 0.0–0.1)
EOS ABS: 0.3 10*3/uL (ref 0.0–0.5)
Eosinophils Relative: 5 %
HCT: 43.4 % (ref 36.0–46.0)
Hemoglobin: 14.7 g/dL (ref 12.0–15.0)
Immature Granulocytes: 1 %
Lymphocytes Relative: 30 %
Lymphs Abs: 1.7 10*3/uL (ref 0.7–4.0)
MCH: 28.8 pg (ref 26.0–34.0)
MCHC: 33.9 g/dL (ref 30.0–36.0)
MCV: 84.9 fL (ref 80.0–100.0)
MONO ABS: 0.6 10*3/uL (ref 0.1–1.0)
MONOS PCT: 11 %
Neutro Abs: 3.1 10*3/uL (ref 1.7–7.7)
Neutrophils Relative %: 52 %
PLATELETS: 366 10*3/uL (ref 150–400)
RBC: 5.11 MIL/uL (ref 3.87–5.11)
RDW: 13.1 % (ref 11.5–15.5)
WBC: 5.9 10*3/uL (ref 4.0–10.5)
nRBC: 0 % (ref 0.0–0.2)

## 2018-06-15 LAB — GLUCOSE, CAPILLARY
Glucose-Capillary: 177 mg/dL — ABNORMAL HIGH (ref 70–99)
Glucose-Capillary: 261 mg/dL — ABNORMAL HIGH (ref 70–99)
Glucose-Capillary: 165 mg/dL — ABNORMAL HIGH (ref 70–99)
Glucose-Capillary: 265 mg/dL — ABNORMAL HIGH (ref 70–99)

## 2018-06-15 LAB — ECHOCARDIOGRAM COMPLETE
Height: 66 in
Weight: 2848 oz

## 2018-06-15 LAB — HEMOGLOBIN A1C
Hgb A1c MFr Bld: 6.7 % — ABNORMAL HIGH (ref 4.8–5.6)
Mean Plasma Glucose: 145.59 mg/dL

## 2018-06-15 LAB — TROPONIN I: Troponin I: <0.03 ng/mL (ref <0.03)

## 2018-06-15 LAB — LIPID PANEL
Cholesterol: 93 mg/dL (ref 0–200)
HDL: 34 mg/dL — ABNORMAL LOW (ref >40)
LDL Cholesterol: 47 mg/dL (ref 0–99)
Total CHOL/HDL Ratio: 2.7 RATIO
Triglycerides: 59 mg/dL (ref <150)
VLDL: 12 mg/dL (ref 0–40)

## 2018-06-15 LAB — MAGNESIUM: Magnesium: 2.4 mg/dL (ref 1.7–2.4)

## 2018-06-15 LAB — LIPASE, BLOOD: LIPASE: 27 U/L (ref 11–51)

## 2018-06-15 LAB — TSH: TSH: 2.49 u[IU]/mL (ref 0.350–4.500)

## 2018-06-15 MED ORDER — METOPROLOL TARTRATE 25 MG PO TABS: 25.0000 mg | ORAL_TABLET | Freq: Two times a day (BID) | ORAL | Status: DC

## 2018-06-15 MED ORDER — RAMIPRIL 5 MG PO CAPS
5.0000 mg | ORAL_CAPSULE | Freq: Every day | ORAL | Status: DC
  Administered 2018-06-15 – 2018-06-16 (×2): 5 mg via ORAL
  Filled 2018-06-15 (×2): qty 1

## 2018-06-15 MED ORDER — INSULIN ASPART 100 UNIT/ML ~~LOC~~ SOLN
0.0000 [IU] | Freq: Three times a day (TID) | SUBCUTANEOUS | Status: DC
  Administered 2018-06-15: 2 [IU] via SUBCUTANEOUS
  Administered 2018-06-15: 5 [IU] via SUBCUTANEOUS
  Administered 2018-06-16: 7 [IU] via SUBCUTANEOUS
  Filled 2018-06-15 (×3): qty 1

## 2018-06-15 MED ORDER — ACETAMINOPHEN 325 MG PO TABS: 650.0000 mg | ORAL_TABLET | ORAL | Status: DC | PRN

## 2018-06-15 MED ORDER — POTASSIUM CHLORIDE CRYS ER 20 MEQ PO TBCR
40.0000 meq | EXTENDED_RELEASE_TABLET | Freq: Once | ORAL | Status: AC
  Administered 2018-06-15: 40 meq via ORAL
  Filled 2018-06-15: qty 2

## 2018-06-15 MED ORDER — LIDOCAINE VISCOUS HCL 2 % MT SOLN
15.0000 mL | Freq: Once | OROMUCOSAL | Status: AC
  Administered 2018-06-15: 15 mL via ORAL
  Filled 2018-06-15: qty 15

## 2018-06-15 MED ORDER — METOPROLOL TARTRATE 50 MG PO TABS
50.0000 mg | ORAL_TABLET | Freq: Two times a day (BID) | ORAL | Status: DC
  Administered 2018-06-15 – 2018-06-16 (×2): 50 mg via ORAL
  Filled 2018-06-15 (×3): qty 1

## 2018-06-15 MED ORDER — INSULIN ASPART 100 UNIT/ML ~~LOC~~ SOLN
0.0000 [IU] | Freq: Every day | SUBCUTANEOUS | Status: DC
  Administered 2018-06-15: 3 [IU] via SUBCUTANEOUS
  Filled 2018-06-15: qty 1

## 2018-06-15 MED ORDER — ALUM & MAG HYDROXIDE-SIMETH 200-200-20 MG/5ML PO SUSP
30.0000 mL | Freq: Once | ORAL | Status: AC
  Administered 2018-06-15: 30 mL via ORAL
  Filled 2018-06-15: qty 30

## 2018-06-15 MED ORDER — ONDANSETRON HCL 4 MG/2ML IJ SOLN: 4.0000 mg | Freq: Four times a day (QID) | INTRAMUSCULAR | Status: DC | PRN

## 2018-06-15 MED ORDER — IOHEXOL 350 MG/ML SOLN
75.0000 mL | Freq: Once | INTRAVENOUS | Status: AC | PRN
  Administered 2018-06-15: 75 mL via INTRAVENOUS

## 2018-06-15 MED ORDER — SODIUM CHLORIDE 0.9 % IV BOLUS
500.0000 mL | Freq: Once | INTRAVENOUS | Status: AC
  Administered 2018-06-15: 500 mL via INTRAVENOUS

## 2018-06-15 MED ORDER — ASPIRIN 81 MG PO CHEW
81.0000 mg | CHEWABLE_TABLET | Freq: Every day | ORAL | Status: DC
  Administered 2018-06-16: 81 mg via ORAL
  Filled 2018-06-15: qty 1

## 2018-06-15 MED ORDER — POTASSIUM CHLORIDE CRYS ER 20 MEQ PO TBCR
30.0000 meq | EXTENDED_RELEASE_TABLET | Freq: Once | ORAL | Status: AC
  Administered 2018-06-15: 30 meq via ORAL
  Filled 2018-06-15: qty 2

## 2018-06-15 MED ORDER — ENOXAPARIN SODIUM 40 MG/0.4ML ~~LOC~~ SOLN
40.0000 mg | SUBCUTANEOUS | Status: DC
  Administered 2018-06-15 – 2018-06-16 (×2): 40 mg via SUBCUTANEOUS
  Filled 2018-06-15 (×2): qty 0.4

## 2018-06-15 MED ORDER — ALPRAZOLAM 0.25 MG PO TABS: 0.2500 mg | ORAL_TABLET | Freq: Two times a day (BID) | ORAL | Status: DC | PRN

## 2018-06-15 MED ORDER — ROSUVASTATIN CALCIUM 10 MG PO TABS
20.0000 mg | ORAL_TABLET | Freq: Every day | ORAL | Status: DC
  Administered 2018-06-15: 20 mg via ORAL
  Filled 2018-06-15: qty 2

## 2018-06-15 NOTE — ED Provider Notes (Signed)
[facility] Emergency Department Provider Note   ____________________________________________   First MD Initiated Contact with Patient 06/15/18 [phone]     (approximate)  I have reviewed the triage vital signs and the nursing notes.   HISTORY  Chief Complaint Chest Pain    HPI [name] is a 63 y.o. female who presents to the ED from home with a chief complaint of chest pain and shortness of breath.  Patient has had pain between her shoulder blades for almost 1 month.  She saw her PCP 2 weeks ago who noted abnormal EKG and started her on metoprolol and aspirin.  She was referred to cardiology.  Patient in fact had a cardiology appointment on March 18 which was canceled secondary to the coronavirus pandemic.  She presents to the ED for chest pain which started in the evening.  Describes left-sided chest tightness radiating into her left arm.  Symptoms associated with diaphoresis.  Denies recent fever, chills, abdominal pain, nausea, vomiting, diarrhea.  Denies recent travel or trauma.  Denies exposure to coronavirus.       Past Medical History:  Diagnosis Date  . Anemia   . Arthritis   . Cancer [facility])    history of skin  . Chronic kidney disease    renal insuff  . Diabetes mellitus without complication ([place])   . Family history of adverse reaction to anesthesia    sister-malignant hyperthermia  . Hyperlipemia   . Malignant hyperthermia    sister had malginant hyperthermia  . Osteoporosis     Patient Active Problem List   Diagnosis Date Noted  . Unstable angina ([place]) 06/15/2018  . Chest pain   . 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 CESAREAN SECTION    . CHOLECYSTECTOMY  2006   lap choli with umb hernia  . COLONOSCOPY    . LIPOMA EXCISION Left 03/15/2014   Procedure: EXCISION LIPOMA LEFT LOWER QUARDRANT ABDOMINAL WALL;  Surgeon: Georganna Skeans, MD;  Location: [facility];   Service: General;  Laterality: Left;  . TONSILLECTOMY    . TRIGGER FINGER RELEASE  2012   lt small,middle,long  . TRIGGER FINGER RELEASE Right 07/14/2017   Procedure: RELEASE TRIGGER FINGER/A-1 PULLEY RIGHT THUMB;  Surgeon: Daryll Brod, MD;  Location: [facility];  Service: Orthopedics;  Laterality: Right;  . UMBILICAL HERNIA REPAIR  2006   with lap choli    Prior to Admission medications   Medication Sig Start Date End Date Taking? Authorizing Provider  alendronate (FOSAMAX) 70 MG tablet Take 70 mg by mouth once a week. Take with a full glass of water on an empty stomach.   Yes [provider]  aspirin 81 MG chewable tablet Chew 81 mg by mouth daily.   Yes [provider]  calcium carbonate (CALCIUM 600) 600 MG TABS tablet Take 600 mg by mouth daily.    Yes [provider]  Insulin Glargine (BASAGLAR KWIKPEN) 100 UNIT/ML SOPN Inject 35 Units into the skin at bedtime. 11/26/16  Yes [provider]  insulin lispro (HUMALOG) 100 UNIT/ML injection Inject 0-15 Units into the skin 3 (three) times daily before meals. Sliding scale   Yes [provider]  metFORMIN (GLUCOPHAGE) 1000 MG tablet Take 1,000 mg by mouth 2 (two) times daily with a meal.   Yes [provider]  metoprolol succinate (TOPROL-XL) 25 MG 24 hr tablet Take 25 mg by mouth daily. 05/31/18  Yes [provider]  nitrofurantoin (MACRODANTIN) 100 MG capsule Take 100 mg by mouth 2 (two) times daily. 06/08/18  Yes [provider]  ramipril (ALTACE) 5 MG capsule Take 5 mg by mouth daily.    Yes [provider]  rosuvastatin (CRESTOR) 20 MG tablet Take 20 mg by mouth daily.   Yes [provider]    Allergies Patient has no known allergies.  Family History  Problem Relation Age of Onset  . Breast cancer Sister   . Breast cancer Sister   . Heart disease Mother   . Diabetes Father   . Heart disease Other     Social History Social  History   Tobacco Use  . Smoking status: Never Smoker  . Smokeless tobacco: Never Used  Substance Use Topics  . Alcohol use: No  . Drug use: No    Review of Systems  Constitutional: No fever/chills Eyes: No visual changes. ENT: No sore throat. Cardiovascular: Denies chest pain. Respiratory: Denies shortness of breath. Gastrointestinal: No abdominal pain.  No nausea, no vomiting.  No diarrhea.  No constipation. Genitourinary: Negative for dysuria. Musculoskeletal: Negative for back pain. Skin: Negative for rash. Neurological: Negative for headaches, focal weakness or numbness.   ____________________________________________   PHYSICAL EXAM:  VITAL SIGNS: ED Triage Vitals  Enc Vitals Group     BP 06/15/18 0532 (!) 152/82     Pulse Rate 06/15/18 0532 83     Resp 06/15/18 0532 16     Temp 06/15/18 0532 98 F (36.7 C)     Temp Source 06/15/18 0532 Oral     SpO2 06/15/18 0532 97 %     Weight 06/15/18 0521 178 lb (80.7 kg)     Height 06/15/18 0521 5\' 6"  (1.676 m)     Head Circumference --      Peak Flow --      Pain Score 06/15/18 0521 7     Pain Loc --      Pain Edu? --      Excl. in [place]? --     Constitutional: Alert and oriented. Well appearing and in no acute distress. Eyes: Conjunctivae are normal. PERRL. EOMI. Head: Atraumatic. Nose: No congestion/rhinnorhea. Mouth/Throat: Mucous membranes are moist.  Oropharynx non-erythematous. Neck: No stridor.   Cardiovascular: Normal rate, regular rhythm. Grossly normal heart sounds.  Good peripheral circulation. Respiratory: Normal respiratory effort.  No retractions. Lungs CTAB. Gastrointestinal: Soft and nontender. No distention. No abdominal bruits. No CVA tenderness. Musculoskeletal: No lower extremity tenderness nor edema.  No joint effusions. Neurologic:  Normal speech and language. No gross focal neurologic deficits are appreciated. No gait instability. Skin:  Skin is warm, dry and intact. No rash noted.  Psychiatric: Mood and affect are normal. Speech and behavior are normal.  ____________________________________________   LABS (all labs ordered are listed, but only abnormal results are displayed)  Labs Reviewed  COMPREHENSIVE METABOLIC PANEL - Abnormal; Notable for the following components:      Result Value   Potassium 3.3 (*)    Glucose, Bld 127 (*)    Calcium 8.8 (*)    All other components within normal limits  LIPID PANEL - Abnormal; Notable for the following components:   HDL 34 (*)    All other components within normal limits  HEMOGLOBIN A1C - Abnormal; Notable for the following components:   Hgb A1c MFr Bld 6.7 (*)    All other components within normal limits  GLUCOSE, CAPILLARY - Abnormal; Notable for the following  components:   Glucose-Capillary 177 (*)    All other components within normal limits  GLUCOSE, CAPILLARY - Abnormal; Notable for the following components:   Glucose-Capillary 261 (*)    All other components within normal limits  BASIC METABOLIC PANEL - Abnormal; Notable for the following components:   Sodium 133 (*)    Glucose, Bld 329 (*)    Creatinine, Ser 0.38 (*)    Calcium 8.7 (*)    All other components within normal limits  GLUCOSE, CAPILLARY - Abnormal; Notable for the following components:   Glucose-Capillary 165 (*)    All other components within normal limits  GLUCOSE, CAPILLARY - Abnormal; Notable for the following components:   Glucose-Capillary 265 (*)    All other components within normal limits  GLUCOSE, CAPILLARY - Abnormal; Notable for the following components:   Glucose-Capillary 343 (*)    All other components within normal limits  CBC WITH DIFFERENTIAL/PLATELET  LIPASE, BLOOD  TROPONIN I  HIV ANTIBODY (ROUTINE TESTING W REFLEX)  TSH  TROPONIN I  MAGNESIUM  CBC   ____________________________________________  EKG  ED ECG REPORT I, , J, the attending physician, personally viewed and interpreted this ECG.   Date:  06/15/2018  EKG Time: 0531  Rate: 85  Rhythm: normal EKG, normal sinus rhythm  Axis: Normal  Intervals:none  ST&T Change: Nonspecific  ____________________________________________  RADIOLOGY  ED MD interpretation:  Pending  Official radiology report(s): No results found.  ____________________________________________   PROCEDURES  Procedure(s) performed (including Critical Care):  Procedures   ____________________________________________   INITIAL IMPRESSION / ASSESSMENT AND PLAN / ED COURSE  As part of my medical decision making, I reviewed the following data within the [place] History obtained from family, Nursing notes reviewed and incorporated, Labs reviewed, EKG interpreted, Old chart reviewed, Radiograph reviewed, Discussed with admitting physician and Notes from prior ED visits     63 year old female with a one-month history of pain between her shoulder blades, unable to see cardiology secondary to the current coronavirus pandemic. Now with new chest pain and shortness of breath concerning for unstable angina. Differential diagnosis includes, but is not limited to, ACS, aortic dissection, pulmonary embolism, cardiac tamponade, pneumothorax, pneumonia, pericarditis, myocarditis, GI-related causes including esophagitis/gastritis, and musculoskeletal chest wall pain.    Patient reports chest x-ray by her PCP 2 weeks ago which was unremarkable.  In addition a cardiac work-up, will obtain CTA of her chest to evaluate for PE.  Given patient's difficulties getting in to see cardiology, anticipate hospitalization for unstable angina.   Clinical Course as of Jun 18 531  Thu Jun 15, 2018  0641 Updated patient and spouse of all lab results.  Currently awaiting results of CT scan.  Have discussed case with hospitalist Dr. Marcille Blanco who will evaluate patient in the emergency department for admission.   [JS]    Clinical Course User Index [JS] Paulette Blanch, MD      ____________________________________________   FINAL CLINICAL IMPRESSION(S) / ED DIAGNOSES  Final diagnoses:  Chest pain, unspecified type  Unstable angina [facility])     ED Discharge Orders         Ordered    Increase activity slowly     06/16/18 1337    Diet - low sodium heart healthy     06/16/18 1337           Note:  This document was prepared using Dragon voice recognition software and may include unintentional dictation errors.   Paulette Blanch,  MD 06/18/18 9914

## 2018-06-15 NOTE — Plan of Care (Signed)
  Problem: Clinical Measurements: Goal: Cardiovascular complication will be avoided Outcome: Progressing   Problem: Activity: Goal: Risk for activity intolerance will decrease Outcome: Progressing   Problem: Pain Managment: Goal: General experience of comfort will improve Outcome: Progressing   Problem: Activity: Goal: Ability to tolerate increased activity will improve Outcome: Progressing   Problem: Cardiac: Goal: Ability to achieve and maintain adequate cardiovascular perfusion will improve Outcome: Progressing

## 2018-06-15 NOTE — ED Notes (Signed)
Manuella Ghazi, MD admitting MD at bedside.

## 2018-06-15 NOTE — Consult Note (Addendum)
[name] Consultation:   Patient ID: JAKALA HERFORD MRN: [id number]; DOB: 1955/06/28  Admit date: 06/15/2018 Date of Consult: 06/15/2018  Primary Care Provider: Haywood Pao, MD Primary Cardiologist: [name]. Previously scheduled with [name]; [name] currently rounding Primary Electrophysiologist:  None    Patient Profile:   [name] is a 63 y.o. female with DM2, HLD, and documented history of anemia who is being seen today for the evaluation of chest pain at the request of [name].  History of Present Illness:   [name] is a 63 yo female with PMH as above. No current tobacco, drug, or alcohol use. Her mother reportedly may have had Afib, diagnosed at "a young age." Her son has a PPM at 22 yo.   Per patient, she saw her PCP, [name], approximately 1 month ago (04/2018) for c/o back pain, located between her shoulder blades. During this PCP appointment, she reportedly had an abnormal EKG and was thus started by her PCP on ASA and a BB. (On review of EMR, EKG unable to be accessed at this time.)  Her PCP then also referred her to [name] with an appointment scheduled for 3/18.  On Sunday, 3/15, she reportedly felt "a little off," due to low blood sugar with known DM2. She had a cookie and something to drink then proceeded to church. Unfortunately, while still at church, she experienced emesis x1. She denied any associated sx with the emesis at that time. No chest pain, rapid heart rate, diaphoresis, or palpitations reported at that time.  She did admit to SOB/DOE but denied any fever, chills, cough, or emesis. No reported sick contacts.   On 3/17, she received a call by [name] that she was still considered "acute chest pain" and would therefore still be seen as an outpatient at [name]. On 3/18, and an hour before her appointment, she reportedly received another call that stated, due to [place] developments, her appointmentwould  be cancelled. She was told to present to [name] ED if CP or further emesis and that she would receive a call back later that day. She stated she never got another call.   On 3/19, she presented to the [name] ED and after she felt sharp and left-sided chest pain, lasting only seconds and occurring at rest and while resting in bed. She also continued to have SOB / DOE. Her back pain (between her shoulder blades) had reportedly moved to her left shoulder blade only and "since starting the BB/ASA." No other reported associated sx, other than L arm numbness / tingling. No reported tachycardia, diaphoresis, or palpitations. At presentation, BP 152/82, HR 83. K 3.3. Troponin negative x1. EKG without acute changes and NSR, 85bpm, IVCD, and with nonspecific ST/T changes. CTA and second troponin ordered, as well as echo for 3/19 and 3/20 stress test.   No current CP reported but does still feel SOB, even at rest. She also reports continued L shoulder blade pain and L arm tingling/numbness. She also reports current gas.   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   Osteoporosis  Past Surgical History:  Procedure Laterality Date   CESAREAN SECTION     CHOLECYSTECTOMY  2006   lap choli with umb hernia   COLONOSCOPY     LIPOMA EXCISION Left 03/15/2014   Procedure: EXCISION LIPOMA LEFT LOWER [place] ABDOMINAL WALL;  Surgeon: Patricia Skeans, MD;  Location: [facility];  Service: General;  Laterality: Left;   TONSILLECTOMY     TRIGGER FINGER RELEASE  2012   lt small,middle,long   TRIGGER FINGER RELEASE Right 07/14/2017   Procedure: RELEASE TRIGGER FINGER/A-1 PULLEY RIGHT  THUMB;  Surgeon: Patricia Brod, MD;  Location: [facility];  Service: Orthopedics;  Laterality: Right;   UMBILICAL HERNIA REPAIR  2006   with lap choli     Home Medications:  Prior to Admission medications   Medication Sig Start Date End Date Taking? Authorizing Provider  alendronate (FOSAMAX) 70 MG tablet Take 70 mg by mouth once a week. Take with a full glass of water on an empty stomach.   Yes [provider]  aspirin 81 MG chewable tablet Chew 81 mg by mouth daily.   Yes [provider]  calcium carbonate (CALCIUM 600) 600 MG TABS tablet Take 600 mg by mouth daily.    Yes [provider]  Insulin Glargine (BASAGLAR KWIKPEN) 100 UNIT/ML SOPN Inject 35 Units into the skin at bedtime. 11/26/16  Yes [provider]  insulin lispro (HUMALOG) 100 UNIT/ML injection Inject 0-15 Units into the skin 3 (three) times daily before meals. Sliding scale   Yes [provider]  metFORMIN (GLUCOPHAGE) 1000 MG tablet Take 1,000 mg by mouth 2 (two) times daily with a meal.   Yes [provider]  metoprolol succinate (TOPROL-XL) 25 MG 24 hr tablet Take 25 mg by mouth daily. 05/31/18  Yes [provider]  nitrofurantoin (MACRODANTIN) 100 MG capsule Take 100 mg by mouth 2 (two) times daily. 06/08/18  Yes [provider]  ramipril (ALTACE) 5 MG capsule Take 5 mg by mouth daily.    Yes [provider]  rosuvastatin (CRESTOR) 20 MG tablet Take 20 mg by mouth daily.   Yes [provider]    Inpatient Medications: Scheduled Meds:  enoxaparin (LOVENOX) injection  40 mg Subcutaneous Q24H   metoprolol tartrate  25 mg Oral BID   Continuous Infusions:  PRN Meds: acetaminophen, ALPRAZolam, ondansetron (ZOFRAN) IV  Allergies:   No Known Allergies  Social History:   Social History   Socioeconomic History   Marital status: Married    Spouse name: Not on file   Number of children: Not on file   Years of  education: Not on file   Highest education level: Not on file  Occupational History   Not on file  Social Needs   Financial resource strain: Not on file   Food insecurity:    Worry: Not on file    Inability: Not on file   Transportation needs:    Medical: Not on file    Non-medical: Not on file  Tobacco Use   Smoking status: Never Smoker   Smokeless tobacco: Never Used  Substance and Sexual Activity   Alcohol use: No   Drug use: No   Sexual activity: Not on file  Lifestyle   Physical activity:    Days per week: Not on file    Minutes per session: Not on file   Stress: Not on file  Relationships   Social connections:    Talks on phone: Not on file  Gets together: Not on file    Attends religious service: Not on file    Active member of club or organization: Not on file    Attends meetings of clubs or organizations: Not on file    Relationship status: Not on file   Intimate partner violence:    Fear of current or ex partner: Not on file    Emotionally abused: Not on file    Physically abused: Not on file    Forced sexual activity: Not on file  Other Topics Concern   Not on file  Social History Narrative   Not on file    Family History:    Family History  Problem Relation Age of Onset   Breast cancer Sister    Breast cancer Sister    Heart disease Mother    Diabetes Father    Heart disease Other      ROS:  Please see the history of present illness.   Review of Systems  Constitutional: Negative for chills and fever.  Respiratory: Positive for shortness of breath. Negative for cough.   Cardiovascular: Positive for chest pain. Negative for palpitations, orthopnea, leg swelling and PND.       Not current. Previous sharp and stabbing central chest pain, lasting only seconds and occurring at rest and while lying in bed.  Gastrointestinal: Positive for nausea and vomiting. Negative for abdominal pain, constipation and diarrhea.       Previous  episode of emesis x1 on 3/15.  No emesis since that time.  No current nausea but does report current gas.  Genitourinary: Negative for dysuria.  Musculoskeletal: Positive for back pain. Negative for falls.       Current pain at left shoulder blade with associated numbness and tingling in left arm.  Previous reported pain between shoulder blades since approximately 1 month ago.  Neurological: Positive for tingling. Negative for dizziness, focal weakness, loss of consciousness and weakness.       Left arm currently " tingling"  Psychiatric/Behavioral: Negative for substance abuse.  All other systems reviewed and are negative.   All other ROS reviewed and negative.     Physical Exam/Data:   Vitals:   06/15/18 0730 06/15/18 0745 06/15/18 0800 06/15/18 0815  BP: 134/77  (!) 142/84   Pulse: 87 (!) 105 87 85  Resp: 14 17 15 17   Temp:      TempSrc:      SpO2: 95% 96% 96% 94%  Weight:      Height:       No intake or output data in the 24 hours ending 06/15/18 0824 Filed Weights   06/15/18 0521  Weight: 80.7 kg   Body mass index is 28.73 kg/m.  General:  Well nourished, well developed, in no acute distress HEENT: normal Neck: no JVD Vascular: Radial pulses 2+ bilaterally   Cardiac:  normal S1, S2; RRR, extrasystole heard occasionally; no murmur Lungs:  clear to auscultation bilaterally, no wheezing, rhonchi or rales  Abd: soft, nontender, no hepatomegaly  Ext: no bilateral lower extremity edema Musculoskeletal:  No deformities Skin: warm and dry  Neuro:  no focal abnormalities noted Psych:  Normal affect   EKG: NSR, 85bpm, IVCD. Nonspecific changes. Widened QRS from previous EKG 06/2017. No acute changes Telemetry:  Telemetry was personally reviewed and demonstrates:  NSR, PVCs  CV Studies:   Relevant CV Studies: 04/03/2009 Nuclear Stress Study Exercise Capacity: Good exercise capacity. BP Response: Normal blood pressure response. Clinical Symptoms: Chest pain, easing in  recovery but not gone. ECG Impression: 1 to 2 mm flat ST depression in V3, V4 at near peak excercise.Normalizing by 17 sec recovery  Electrically positive but specificity limited. Overall Impression: Normal perfusion.  Laboratory Data:  Chemistry Recent Labs  Lab 06/15/18 0554  NA 135  K 3.3*  CL 98  CO2 27  GLUCOSE 127*  BUN 10  CREATININE 0.52  CALCIUM 8.8*  GFRNONAA >60  GFRAA >60  ANIONGAP 10    Recent Labs  Lab 06/15/18 0554  PROT 7.1  ALBUMIN 4.2  AST 18  ALT 19  ALKPHOS 54  BILITOT 0.5   Hematology Recent Labs  Lab 06/15/18 0554  WBC 5.9  RBC 5.11  HGB 14.7  HCT 43.4  MCV 84.9  MCH 28.8  MCHC 33.9  RDW 13.1  PLT 366   Cardiac Enzymes Recent Labs  Lab 06/15/18 0554  TROPONINI <0.03   No results for input(s): TROPIPOC in the last 168 hours.  BNPNo results for input(s): BNP, PROBNP in the last 168 hours.  DDimer No results for input(s): DDIMER in the last 168 hours.  Radiology/Studies:  Ct Angio Chest Pe W/cm &/or Wo Cm  Result Date: 06/15/2018 CLINICAL DATA:  Shoulder blade pain.  Left-sided upper back pain EXAM: CT ANGIOGRAPHY CHEST WITH CONTRAST TECHNIQUE: Multidetector CT imaging of the chest was performed using the standard protocol during bolus administration of intravenous contrast. Multiplanar CT image reconstructions and MIPs were obtained to evaluate the vascular anatomy. CONTRAST:  31mL OMNIPAQUE IOHEXOL 350 MG/ML SOLN COMPARISON:  Chest x-ray 06/16/2004 FINDINGS: Cardiovascular: Satisfactory opacification of the pulmonary arteries to the segmental level. No evidence of pulmonary embolism. Normal heart size. No pericardial effusion. Coronary atherosclerotic calcifications seen along the LAD. Aortic atherosclerotic calcification. Mediastinum/Nodes: Negative for adenopathy. 15 mm left thyroid nodule. Lungs/Pleura: Generalized airway thickening. 5 mm ground-glass nodule in the left upper lobe. There are a few small scattered cysts solid pulmonary  nodules measuring up to 6 mm average diameter in the subpleural right lower lobe on images 78 and 63. There is no edema, consolidation, effusion, or pneumothorax. Upper Abdomen: Cholecystectomy. Musculoskeletal: No acute or aggressive finding Review of the MIP images confirms the above findings. IMPRESSION: 1. Negative for pulmonary embolism. 2. Generalized mild airway thickening. 3. Small pulmonary nodules measuring up to 6 mm average diameter. Non-contrast chest CT at 6-12 months is recommended. If the nodule is stable at time of repeat CT, then future CT at 18-24 months (from today's scan) is considered optional for low-risk patients, but is recommended for high-risk patients. This recommendation follows the consensus statement: Guidelines for Management of Incidental Pulmonary Nodules Detected on CT Images: From the Fleischner Society 2017; Radiology 2017; 284:228-243. 4. Aortic and coronary atherosclerosis. 5. 15 mm left thyroid nodule, at the size threshold where elective sonographic follow-up is recommended. Electronically Signed   By: Monte Fantasia M.D.   On: 06/15/2018 07:03    Assessment and Plan:   Atypical Chest pain with Negative Troponin x1 - No current CP but does feel SOB. See HPI above. Atypical sharp CP, lasting only seconds on 3/19. Asx PVCs on telemetry. EKG is NSR with non-specific changes, negative for acute changes. Troponin negative x1 with second STAT troponin scheduled for 11:30AM.  - Atypical CP with low suspicion for cardiac etiology pending second troponin. Risk factors for cardiac etiology to include PMH (DM2, HLD). Pending troponin/echo/stress to officially r/o acute cardiac ischemic event. TTE ordered for today with stress test scheduled for 3/20 as she  has eaten today. No further caffeine recommended. NPO after midnight. Pending also additional labs ordered by myself for risk stratification including lipid panel, A1C, TSH.  - Continue PTA ASA, ACEi, BB, and statin. Nitro as  needed for CP. PPI recommended with ASA given GI symptoms reported. If second troponin negative, no concerning findings on echo, and stress test low risk, will r/o for cardiac ischemia and defer further workup per IM. Plan to follow-up as outpatient with further chest pain or concerning symptoms. No heparin at this time, given no indication for urgent cardiac catheterization at this time. Continue Lovenox.   Elevated Blood Pressure - Current BP 144/85, HR 80s.  - PTA low dose BB. Will increase for additional BP control and as current room in HR with PVCs on telemetry.  - Restarting PTA ACEi, ramipril 5mg  daily. Consider escalation for further BP control if needed.  - Follow-up BP checks recommended as outpatient per PCP.   Hypokalemia - Likely in setting of GI distress / emesis. Will replete with goal 4.0. Check Mg. Daily BMET.   HLD - Will restart PTA statin, Crestor 20mg . No lipid labs available. Pending ordered lipid panel, will adjust as needed and if so follow-up labs recommended after adjustment for follow-up liver function and lipid check.  DM2 - PTA metformin and insulin. Per IM, continue SSI. Pending A1C. NPO after midnight d/t stress test 3/20.   For questions or updates, please contact [place] Please consult [email or web address] for contact info under     Signed, Arvil Chaco, PA-C  06/15/2018 8:24 AM

## 2018-06-15 NOTE — Progress Notes (Signed)
Pt arrived to 2A, room 246.  Pt placed on telemetry.  Pt and husband oriented to room and plan of care. Reports back pain 5/10, but declined interventions.  No further concerns voiced. Bed low, brakes locked, and call bell within reach.

## 2018-06-15 NOTE — ED Triage Notes (Signed)
Patient ambulatory to triage with steady gait, without difficulty or distress noted; pt reports left sided upper back pain, CP radiating into left arm x month accomp by [facility]

## 2018-06-15 NOTE — ED Notes (Signed)
PT TO CT AT THIS VIA STRETCHER.

## 2018-06-15 NOTE — ED Notes (Addendum)
ED TO INPATIENT HANDOFF REPORT  ED Nurse Name and Phone #:  Wynn Kernes, RN   S Name/Age/Gender [name] 63 y.o. female Room/Bed: ED16A/ED16A  Code Status   Code Status: Full Code  Home/SNF/Other Home Patient oriented to: self, place, time and situation Is this baseline? Yes   Triage Complete: Triage complete  Chief Complaint Left Side pain and SOB  Triage Note Patient ambulatory to triage with steady gait, without difficulty or distress noted; pt reports left sided upper back pain, CP radiating into left arm x month accomp by [facility]   Allergies No Known Allergies  Level of Care/Admitting Diagnosis ED Disposition    ED Disposition Condition [facility] Area: [place] [100120]  Level of Care: Telemetry [5]  Diagnosis: Unstable angina [facility]) [[id number]]  Admitting Physician: Max Sane [[id number]]  Attending Physician: Max Sane [[id number]]  PT Class (Do Not Modify): Observation [104]  PT Acc Code (Do Not Modify): Observation [10022]       B Medical/Surgery History Past Medical History:  Diagnosis Date  . Anemia   . Arthritis   . Cancer [facility])    history of skin  . Chronic kidney disease    renal insuff  . Diabetes mellitus without complication ([place])   . Family history of adverse reaction to anesthesia    sister-malignant hyperthermia  . Hyperlipemia   . Malignant hyperthermia    sister had malginant hyperthermia  . Osteoporosis    Past Surgical History:  Procedure Laterality Date  . CESAREAN SECTION    . CHOLECYSTECTOMY  2006   lap choli with umb hernia  . COLONOSCOPY    . LIPOMA EXCISION Left 03/15/2014   Procedure: EXCISION LIPOMA LEFT LOWER QUARDRANT ABDOMINAL WALL;  Surgeon: Georganna Skeans, MD;  Location: [place];  Service: General;  Laterality: Left;  . TONSILLECTOMY    . TRIGGER FINGER RELEASE  2012   lt small,middle,long  . TRIGGER FINGER RELEASE Right 07/14/2017   Procedure: RELEASE TRIGGER  FINGER/A-1 PULLEY RIGHT THUMB;  Surgeon: Daryll Brod, MD;  Location: [place];  Service: Orthopedics;  Laterality: Right;  . UMBILICAL HERNIA REPAIR  2006   with lap choli     A IV Location/Drains/Wounds Patient Lines/Drains/Airways Status   Active Line/Drains/Airways    Name:   Placement date:   Placement time:   Site:   Days:   Peripheral IV 06/15/18 Right Antecubital   06/15/18    0600    Antecubital   less than 1   Incision (Closed) 03/15/14 Abdomen Left   03/15/14    0802     1553   Incision (Closed) 07/14/17 Hand Right   07/14/17    0837     336          Intake/Output Last 24 hours No intake or output data in the 24 hours ending 06/15/18 0803  Labs/Imaging Results for orders placed or performed during the hospital encounter of 06/15/18 (from the past 48 hour(s))  CBC with Differential     Status: None   Collection Time: 06/15/18  5:54 AM  Result Value Ref Range   WBC 5.9 4.0 - 10.5 K/uL   RBC 5.11 3.87 - 5.11 MIL/uL   Hemoglobin 14.7 12.0 - 15.0 g/dL   HCT 43.4 36.0 - 46.0 %   MCV 84.9 80.0 - 100.0 fL   MCH 28.8 26.0 - 34.0 pg   MCHC 33.9 30.0 - 36.0 g/dL   RDW 13.1  11.5 - 15.5 %   Platelets 366 150 - 400 K/uL   nRBC 0.0 0.0 - 0.2 %   Neutrophils Relative % 52 %   Neutro Abs 3.1 1.7 - 7.7 K/uL   Lymphocytes Relative 30 %   Lymphs Abs 1.7 0.7 - 4.0 K/uL   Monocytes Relative 11 %   Monocytes Absolute 0.6 0.1 - 1.0 K/uL   Eosinophils Relative 5 %   Eosinophils Absolute 0.3 0.0 - 0.5 K/uL   Basophils Relative 1 %   Basophils Absolute 0.1 0.0 - 0.1 K/uL   Immature Granulocytes 1 %   Abs Immature Granulocytes 0.06 0.00 - 0.07 K/uL    Comment: Performed at [facility], [address] [place], [place] [address]  Comprehensive metabolic panel     Status: Abnormal   Collection Time: 06/15/18  5:54 AM  Result Value Ref Range   Sodium 135 135 - 145 mmol/L   Potassium 3.3 (L) 3.5 - 5.1 mmol/L   Chloride 98 98 - 111 mmol/L   CO2 27 22 - 32  mmol/L   Glucose, Bld 127 (H) 70 - 99 mg/dL   BUN 10 8 - 23 mg/dL   Creatinine, Ser 0.52 0.44 - 1.00 mg/dL   Calcium 8.8 (L) 8.9 - 10.3 mg/dL   Total Protein 7.1 6.5 - 8.1 g/dL   Albumin 4.2 3.5 - 5.0 g/dL   AST 18 15 - 41 U/L   ALT 19 0 - 44 U/L   Alkaline Phosphatase 54 38 - 126 U/L   Total Bilirubin 0.5 0.3 - 1.2 mg/dL   GFR calc non Af Amer >60 >60 mL/min   GFR calc Af Amer >60 >60 mL/min   Anion gap 10 5 - 15    Comment: Performed at [facility], [address] [place], [place] [address]  Lipase, blood     Status: None   Collection Time: 06/15/18  5:54 AM  Result Value Ref Range   Lipase 27 11 - 51 U/L    Comment: Performed at [facility], [address] [place], [place] [address]  Troponin I - Once     Status: None   Collection Time: 06/15/18  5:54 AM  Result Value Ref Range   Troponin I <0.03 <0.03 ng/mL    Comment: Performed at [facility], [address] [place], [place] [address]   Ct Angio Chest Pe W/cm &/or Wo Cm  Result Date: 06/15/2018 CLINICAL DATA:  Shoulder blade pain.  Left-sided upper back pain EXAM: CT ANGIOGRAPHY CHEST WITH CONTRAST TECHNIQUE: Multidetector CT imaging of the chest was performed using the standard protocol during bolus administration of intravenous contrast. Multiplanar CT image reconstructions and MIPs were obtained to evaluate the vascular anatomy. CONTRAST:  57mL OMNIPAQUE IOHEXOL 350 MG/ML SOLN COMPARISON:  Chest x-ray 06/16/2004 FINDINGS: Cardiovascular: Satisfactory opacification of the pulmonary arteries to the segmental level. No evidence of pulmonary embolism. Normal heart size. No pericardial effusion. Coronary atherosclerotic calcifications seen along the LAD. Aortic atherosclerotic calcification. Mediastinum/Nodes: Negative for adenopathy. 15 mm left thyroid nodule. Lungs/Pleura: Generalized airway thickening. 5 mm ground-glass nodule in the left upper lobe. There are a few small scattered cysts solid  pulmonary nodules measuring up to 6 mm average diameter in the subpleural right lower lobe on images 78 and 63. There is no edema, consolidation, effusion, or pneumothorax. Upper Abdomen: Cholecystectomy. Musculoskeletal: No acute or aggressive finding Review of the MIP images confirms the above findings. IMPRESSION: 1. Negative for pulmonary embolism.  2. Generalized mild airway thickening. 3. Small pulmonary nodules measuring up to 6 mm average diameter. Non-contrast chest CT at 6-12 months is recommended. If the nodule is stable at time of repeat CT, then future CT at 18-24 months (from today's scan) is considered optional for low-risk patients, but is recommended for high-risk patients. This recommendation follows the consensus statement: Guidelines for Management of Incidental Pulmonary Nodules Detected on CT Images: From the Fleischner Society 2017; Radiology 2017; 284:228-243. 4. Aortic and coronary atherosclerosis. 5. 15 mm left thyroid nodule, at the size threshold where elective sonographic follow-up is recommended. Electronically Signed   By: Monte Fantasia M.D.   On: 06/15/2018 07:03    Pending Labs Unresulted Labs (From admission, onward)    Start     Ordered   06/22/18 0500  Creatinine, serum  (enoxaparin (LOVENOX)    CrCl >/= 30 ml/min)  Weekly,   STAT    Comments:  while on enoxaparin therapy    06/15/18 0729   06/15/18 0730  CBC  (enoxaparin (LOVENOX)    CrCl >/= 30 ml/min)  Once,   STAT    Comments:  Baseline for enoxaparin therapy IF NOT ALREADY DRAWN.  Notify MD if PLT < 100 K.    06/15/18 0729   06/15/18 0730  Creatinine, serum  (enoxaparin (LOVENOX)    CrCl >/= 30 ml/min)  Once,   STAT    Comments:  Baseline for enoxaparin therapy IF NOT ALREADY DRAWN.    06/15/18 0729   06/15/18 0729  HIV antibody (Routine Testing)  Once,   STAT     06/15/18 0729          Vitals/Pain Today's Vitals   06/15/18 0715 06/15/18 0730 06/15/18 0745 06/15/18 0802  BP:  134/77    Pulse: 84  87 (!) 105   Resp: 13 14 17    Temp:      TempSrc:      SpO2: 94% 95% 96%   Weight:      Height:      PainSc:    3     Isolation Precautions No active isolations  Medications Medications  acetaminophen (TYLENOL) tablet 650 mg (has no administration in time range)  ondansetron (ZOFRAN) injection 4 mg (has no administration in time range)  enoxaparin (LOVENOX) injection 40 mg (has no administration in time range)  metoprolol tartrate (LOPRESSOR) tablet 25 mg (has no administration in time range)  ALPRAZolam (XANAX) tablet 0.25 mg (has no administration in time range)  sodium chloride 0.9 % bolus 500 mL (0 mLs Intravenous Stopped 06/15/18 0635)  iohexol (OMNIPAQUE) 350 MG/ML injection 75 mL (75 mLs Intravenous Contrast Given 06/15/18 0638)  alum & mag hydroxide-simeth (MAALOX/MYLANTA) 200-200-20 MG/5ML suspension 30 mL (30 mLs Oral Given 06/15/18 0755)    And  lidocaine (XYLOCAINE) 2 % viscous mouth solution 15 mL (15 mLs Oral Given 06/15/18 0755)    Mobility walks Low fall risk   Focused Assessments Cardiac Assessment Handoff:  Cardiac Rhythm: Normal sinus rhythm Lab Results  Component Value Date   TROPONINI <0.03 06/15/2018   No results found for: DDIMER Does the Patient currently have chest pain? Yes     R Recommendations: See Admitting Provider Note  Report given to:  Janett Billow, RN   Additional Notes:  Pt reports mild chest pain that radiates to back.

## 2018-06-15 NOTE — Progress Notes (Signed)
*  PRELIMINARY RESULTS* Echocardiogram 2D Echocardiogram has been performed.  Sherrie Sport 06/15/2018, 1:38 PM

## 2018-06-15 NOTE — Progress Notes (Signed)
Inpatient Diabetes Program Recommendations  AACE/ADA: New Consensus Statement on Inpatient Glycemic Control (2015)  Target Ranges:  Prepandial:   less than 140 mg/dL      Peak postprandial:   less than 180 mg/dL (1-2 hours)      Critically ill patients:  140 - 180 mg/dL   Lab Results  Component Value Date   GLUCAP 261 (H) 06/15/2018   HGBA1C 6.7 (H) 06/15/2018    Review of Glycemic Control Results for [name], [name] (MRN [id number]) as of 06/15/2018 15:17  Ref. Range 06/15/2018 09:34 06/15/2018 11:56  Glucose-Capillary Latest Ref Range: 70 - 99 mg/dL 177 (H) 261 (H)   Diabetes history: DM2 Outpatient Diabetes medications: Basaglar 35 units + Humalog 0-15 units tid ac meals + Metformin 1 gm bid Current orders for Inpatient glycemic control: Novolog sensitive correction tid + hs 0-5 units  Inpatient Diabetes Program Recommendations:   Noted patient is currently NPO. While in the hospital: -Lantus 17 units daily (50% home insulin dose) -Add Novolog meal coverage when eating tid if eats 50%  Thank you, Bethena Roys E. Shadonna Benedick, RN, MSN, CDE  Diabetes Coordinator Inpatient Glycemic Control Team Team Pager [phone] (8am-5pm) 06/15/2018 3:17 PM

## 2018-06-15 NOTE — H&P (Signed)
[place] [facility] [place] NAME: [name]    MR#:  [id number]  DATE OF BIRTH:  Jun 16, 1955  DATE OF ADMISSION:  06/15/2018  PRIMARY CARE PHYSICIAN: Tisovec, Fransico Him, MD   REQUESTING/REFERRING PHYSICIAN: Paulette Blanch, MD  CHIEF COMPLAINT:   Chief Complaint  Patient presents with  . Chest Pain    HISTORY OF PRESENT ILLNESS:  [name]  is a 63 y.o. female with a known history of DM, anemia comes from home with a chief complaint of pain between shoulder blades and shortness of breath. She saw her PCP 2 weeks ago who noted abnormal EKG and started her on metoprolol and aspirin.  She was referred to cardiology.  Patient in fact had a cardiology appointment on March 18 which was canceled secondary to the coronavirus pandemic.  She presents to the ED for chest pain which started in the evening.  Describes left-sided chest tightness radiating into her left arm.  Symptoms associated with diaphoresis.   PAST MEDICAL HISTORY:   Past Medical History:  Diagnosis Date  . Anemia   . Arthritis   . Cancer [facility])    history of skin  . Chronic kidney disease    renal insuff  . Diabetes mellitus without complication ([place])   . Family history of adverse reaction to anesthesia    sister-malignant hyperthermia  . Hyperlipemia   . Malignant hyperthermia    sister had malginant hyperthermia  . Osteoporosis     PAST SURGICAL HISTORY:   Past Surgical History:  Procedure Laterality Date  . CESAREAN SECTION    . CHOLECYSTECTOMY  2006   lap choli with umb hernia  . COLONOSCOPY    . LIPOMA EXCISION Left 03/15/2014   Procedure: EXCISION LIPOMA LEFT LOWER QUARDRANT ABDOMINAL WALL;  Surgeon: Georganna Skeans, MD;  Location: [place];  Service: General;  Laterality: Left;  . TONSILLECTOMY    . TRIGGER FINGER RELEASE  2012   lt small,middle,long  . TRIGGER FINGER RELEASE Right 07/14/2017   Procedure: RELEASE TRIGGER FINGER/A-1 PULLEY  RIGHT THUMB;  Surgeon: Daryll Brod, MD;  Location: [place];  Service: Orthopedics;  Laterality: Right;  . UMBILICAL HERNIA REPAIR  2006   with lap choli    SOCIAL HISTORY:   Social History   Tobacco Use  . Smoking status: Never Smoker  . Smokeless tobacco: Never Used  Substance Use Topics  . Alcohol use: No    FAMILY HISTORY:   Family History  Problem Relation Age of Onset  . Breast cancer Sister   . Breast cancer Sister   . Heart disease Mother   . Diabetes Father   . Heart disease Other     DRUG ALLERGIES:  No Known Allergies  REVIEW OF SYSTEMS:   Review of Systems  Constitutional: Negative for diaphoresis, fever, malaise/fatigue and weight loss.  HENT: Negative for ear discharge, ear pain, hearing loss, nosebleeds, sore throat and tinnitus.   Eyes: Negative for blurred vision and pain.  Respiratory: Positive for shortness of breath. Negative for cough, hemoptysis and wheezing.   Cardiovascular: Positive for chest pain. Negative for palpitations, orthopnea and leg swelling.  Gastrointestinal: Negative for abdominal pain, blood in stool, constipation, diarrhea, heartburn, nausea and vomiting.  Genitourinary: Negative for dysuria, frequency and urgency.  Musculoskeletal: Negative for back pain and myalgias.  Skin: Negative for itching and rash.  Neurological: Positive for tingling. Negative for dizziness, tremors, focal weakness, seizures, weakness and headaches.  Psychiatric/Behavioral: Negative for depression. The patient is not nervous/anxious.    MEDICATIONS AT HOME:   Prior to Admission medications   Medication Sig Start Date End Date Taking? Authorizing Provider  alendronate (FOSAMAX) 70 MG tablet Take 70 mg by mouth once a week. Take with a full glass of water on an empty stomach.   Yes [provider]  aspirin 81 MG chewable tablet Chew 81 mg by mouth daily.   Yes [provider]  calcium carbonate (CALCIUM 600) 600 MG  TABS tablet Take 600 mg by mouth daily.    Yes [provider]  Insulin Glargine (BASAGLAR KWIKPEN) 100 UNIT/ML SOPN Inject 35 Units into the skin at bedtime. 11/26/16  Yes [provider]  insulin lispro (HUMALOG) 100 UNIT/ML injection Inject 0-15 Units into the skin 3 (three) times daily before meals. Sliding scale   Yes [provider]  metFORMIN (GLUCOPHAGE) 1000 MG tablet Take 1,000 mg by mouth 2 (two) times daily with a meal.   Yes [provider]  metoprolol succinate (TOPROL-XL) 25 MG 24 hr tablet Take 25 mg by mouth daily. 05/31/18  Yes [provider]  nitrofurantoin (MACRODANTIN) 100 MG capsule Take 100 mg by mouth 2 (two) times daily. 06/08/18  Yes [provider]  ramipril (ALTACE) 5 MG capsule Take 5 mg by mouth daily.    Yes [provider]  rosuvastatin (CRESTOR) 20 MG tablet Take 20 mg by mouth daily.   Yes [provider]      VITAL SIGNS:  Blood pressure 134/77, pulse (!) 105, temperature 98 F (36.7 C), temperature source Oral, resp. rate 17, height 5\' 6"  (1.676 m), weight 80.7 kg, SpO2 96 %.  PHYSICAL EXAMINATION:  Physical Exam  GENERAL:  63 y.o.-year-old patient lying in the bed with no acute distress.  EYES: Pupils equal, round, reactive to light and accommodation. No scleral icterus. Extraocular muscles intact.  HEENT: Head atraumatic, normocephalic. Oropharynx and nasopharynx clear.  NECK:  Supple, no jugular venous distention. No thyroid enlargement, no tenderness.  LUNGS: Normal breath sounds bilaterally, no wheezing, rales,rhonchi or crepitation. No use of accessory muscles of respiration.  CARDIOVASCULAR: S1, S2 normal. No murmurs, rubs, or gallops.  ABDOMEN: Soft, nontender, nondistended. Bowel sounds present. No organomegaly or mass.  EXTREMITIES: No pedal edema, cyanosis, or clubbing.  NEUROLOGIC: Cranial nerves II through XII are intact. Muscle strength 5/5 in all extremities. Sensation  intact. Gait not checked.  PSYCHIATRIC: The patient is alert and oriented x 3.  SKIN: No obvious rash, lesion, or ulcer.  LABORATORY PANEL:   CBC Recent Labs  Lab 06/15/18 0554  WBC 5.9  HGB 14.7  HCT 43.4  PLT 366   ------------------------------------------------------------------------------------------------------------------  Chemistries  Recent Labs  Lab 06/15/18 0554  NA 135  K 3.3*  CL 98  CO2 27  GLUCOSE 127*  BUN 10  CREATININE 0.52  CALCIUM 8.8*  AST 18  ALT 19  ALKPHOS 54  BILITOT 0.5   ------------------------------------------------------------------------------------------------------------------  Cardiac Enzymes Recent Labs  Lab 06/15/18 0554  TROPONINI <0.03   ------------------------------------------------------------------------------------------------------------------  RADIOLOGY:  Ct Angio Chest Pe W/cm &/or Wo Cm  Result Date: 06/15/2018 CLINICAL DATA:  Shoulder blade pain.  Left-sided upper back pain EXAM: CT ANGIOGRAPHY CHEST WITH CONTRAST TECHNIQUE: Multidetector CT imaging of the chest was performed using the standard protocol during bolus administration of intravenous contrast. Multiplanar CT image reconstructions and MIPs were obtained to evaluate the vascular anatomy. CONTRAST:  4mL OMNIPAQUE IOHEXOL 350 MG/ML SOLN COMPARISON:  Chest x-ray 06/16/2004 FINDINGS: Cardiovascular: Satisfactory opacification of the pulmonary arteries to the segmental level. No evidence of pulmonary embolism. Normal heart size. No pericardial effusion. Coronary atherosclerotic calcifications seen along the LAD. Aortic atherosclerotic calcification. Mediastinum/Nodes: Negative for adenopathy. 15 mm left thyroid nodule. Lungs/Pleura: Generalized airway thickening. 5 mm ground-glass nodule in the left upper lobe. There are a few small scattered cysts solid pulmonary nodules measuring up to 6 mm average diameter in the subpleural right lower lobe on images 78 and 63.  There is no edema, consolidation, effusion, or pneumothorax. Upper Abdomen: Cholecystectomy. Musculoskeletal: No acute or aggressive finding Review of the MIP images confirms the above findings. IMPRESSION: 1. Negative for pulmonary embolism. 2. Generalized mild airway thickening. 3. Small pulmonary nodules measuring up to 6 mm average diameter. Non-contrast chest CT at 6-12 months is recommended. If the nodule is stable at time of repeat CT, then future CT at 18-24 months (from today's scan) is considered optional for low-risk patients, but is recommended for high-risk patients. This recommendation follows the consensus statement: Guidelines for Management of Incidental Pulmonary Nodules Detected on CT Images: From the Fleischner Society 2017; Radiology 2017; 284:228-243. 4. Aortic and coronary atherosclerosis. 5. 15 mm left thyroid nodule, at the size threshold where elective sonographic follow-up is recommended. Electronically Signed   By: Monte Fantasia M.D.   On: 06/15/2018 07:03   IMPRESSION AND PLAN:  49 Y F with pain between shoulder blade and tingling/numbness in hands and around lips  * Pain between shoulder blade: - serial troponins - cardio c/s - tele - asa  * Hypokalemia - replete and recheck  * DM: ssi Check A1c  * Sinus tach - metoprolol   * Thyroid nodule - outpt w/up    All the records are reviewed and case discussed with ED provider. Management plans discussed with the patient, family ([place]) and they are in agreement.  CODE STATUS: FULL CODE  TOTAL TIME TAKING CARE OF THIS PATIENT: 45 minutes.    Max Sane M.D on 06/15/2018 at 7:59 AM  Between 7am to 6pm - Pager - [phone]  After 6pm go to [email or web address] - Proofreader  [facility]  Office  [phone]  CC: Primary care physician; Tisovec, Fransico Him, MD   Note: This dictation was prepared with Dragon dictation along with smaller phrase technology. Any  transcriptional errors that result from this process are unintentional.

## 2018-06-16 ENCOUNTER — Observation Stay (HOSPITAL_BASED_OUTPATIENT_CLINIC_OR_DEPARTMENT_OTHER): Payer: 59

## 2018-06-16 ENCOUNTER — Telehealth: Payer: Self-pay | Admitting: Cardiovascular Disease

## 2018-06-16 DIAGNOSIS — R0789 Other chest pain: Secondary | ICD-10-CM

## 2018-06-16 DIAGNOSIS — E119 Type 2 diabetes mellitus without complications: Secondary | ICD-10-CM | POA: Diagnosis not present

## 2018-06-16 DIAGNOSIS — R079 Chest pain, unspecified: Secondary | ICD-10-CM

## 2018-06-16 DIAGNOSIS — E876 Hypokalemia: Secondary | ICD-10-CM | POA: Diagnosis not present

## 2018-06-16 LAB — CBC
HCT: 42.3 % (ref 36.0–46.0)
Hemoglobin: 14.1 g/dL (ref 12.0–15.0)
MCH: 28.8 pg (ref 26.0–34.0)
MCHC: 33.3 g/dL (ref 30.0–36.0)
MCV: 86.5 fL (ref 80.0–100.0)
Platelets: 323 10*3/uL (ref 150–400)
RBC: 4.89 MIL/uL (ref 3.87–5.11)
RDW: 12.9 % (ref 11.5–15.5)
WBC: 5.7 10*3/uL (ref 4.0–10.5)
nRBC: 0 % (ref 0.0–0.2)

## 2018-06-16 LAB — NM MYOCAR MULTI W/SPECT W/WALL MOTION / EF
LV dias vol: 40 mL (ref 46–106)
LV sys vol: 16 mL
MPHR: 156 {beats}/min
Peak HR: 129 {beats}/min
Percent HR: 82 %
Rest HR: 90 {beats}/min
TID: 1.35

## 2018-06-16 LAB — BASIC METABOLIC PANEL
Anion gap: 9 (ref 5–15)
BUN: 10 mg/dL (ref 8–23)
CO2: 23 mmol/L (ref 22–32)
CREATININE: 0.38 mg/dL — AB (ref 0.44–1.00)
Calcium: 8.7 mg/dL — ABNORMAL LOW (ref 8.9–10.3)
Chloride: 101 mmol/L (ref 98–111)
GFR calc Af Amer: >60 mL/min (ref >60)
GFR calc non Af Amer: >60 mL/min (ref >60)
Glucose, Bld: 329 mg/dL — ABNORMAL HIGH (ref 70–99)
Potassium: 4.1 mmol/L (ref 3.5–5.1)
Sodium: 133 mmol/L — ABNORMAL LOW (ref 135–145)

## 2018-06-16 LAB — GLUCOSE, CAPILLARY: Glucose-Capillary: 343 mg/dL — ABNORMAL HIGH (ref 70–99)

## 2018-06-16 LAB — HIV ANTIBODY (ROUTINE TESTING W REFLEX): HIV Screen 4th Generation wRfx: NONREACTIVE

## 2018-06-16 MED ORDER — REGADENOSON 0.4 MG/5ML IV SOLN
0.4000 mg | Freq: Once | INTRAVENOUS | Status: AC
  Administered 2018-06-16: 0.4 mg via INTRAVENOUS

## 2018-06-16 MED ORDER — TECHNETIUM TC 99M TETROFOSMIN IV KIT
30.8800 | PACK | Freq: Once | INTRAVENOUS | Status: AC | PRN
  Administered 2018-06-16: 30.88 via INTRAVENOUS

## 2018-06-16 MED ORDER — TECHNETIUM TC 99M TETROFOSMIN IV KIT
10.3400 | PACK | Freq: Once | INTRAVENOUS | Status: AC | PRN
  Administered 2018-06-16: 10.34 via INTRAVENOUS

## 2018-06-16 NOTE — Discharge Instructions (Signed)

## 2018-06-16 NOTE — Progress Notes (Signed)
Discharged to home with her husband.  No new medications.  PCP and cardiology will contact her to schedule appointments

## 2018-06-16 NOTE — Telephone Encounter (Signed)
[place] [facility] calling Needing to schedule patient for a hospital follow up  Please advise on appointment due to COVID-19

## 2018-06-16 NOTE — Discharge Summary (Signed)
[facility] NAME: [name]    MR#:  [id number]  DATE OF BIRTH:  November 07, 1955  DATE OF ADMISSION:  06/15/2018   ADMITTING PHYSICIAN: Max Sane, MD  DATE OF DISCHARGE: 06/16/2018  3:00 PM  PRIMARY CARE PHYSICIAN: Tisovec, Fransico Him, MD   ADMISSION DIAGNOSIS:  Unstable angina ([place]) [I20.0] Chest pain, unspecified type [R07.9] DISCHARGE DIAGNOSIS:  Active Problems:   Unstable angina ([place])   Chest pain  SECONDARY DIAGNOSIS:   Past Medical History:  Diagnosis Date  . Anemia   . Arthritis   . Cancer [facility])    history of skin  . Chronic kidney disease    renal insuff  . Diabetes mellitus without complication ([place])   . Family history of adverse reaction to anesthesia    sister-malignant hyperthermia  . Hyperlipemia   . Malignant hyperthermia    sister had malginant hyperthermia  . Osteoporosis    HOSPITAL COURSE:  58 y f with DM, HTN, anemia admitted for chest pain  Atypical Chest pain with Negative serial Troponins - No current CP but does feel SOB.  - normal echo and neg stress test  HTN - controlled on home regimen  Hypokalemia - repleted  HLD - continue crestor  DM2 - continue home meds DISCHARGE CONDITIONS:  stable CONSULTS OBTAINED:  Treatment Team:  Wellington Hampshire, MD DRUG ALLERGIES:  No Known Allergies DISCHARGE MEDICATIONS:   Allergies as of 06/16/2018   No Known Allergies     Medication List    TAKE these medications   alendronate 70 MG tablet Commonly known as:  FOSAMAX Take 70 mg by mouth once a week. Take with a full glass of water on an empty stomach. Notes to patient:  MAINTAIN YOUR PRIOR SCHEDULE   aspirin 81 MG chewable tablet Chew 81 mg by mouth daily.   Basaglar KwikPen 100 UNIT/ML Sopn Inject 35 Units into the skin at bedtime.   Calcium 600 600 MG Tabs tablet Generic drug:  calcium carbonate Take 600 mg by mouth daily. Notes to patient:  NONE GIVEN TODAY    insulin lispro 100 UNIT/ML injection Commonly known as:  HUMALOG Inject 0-15 Units into the skin 3 (three) times daily before meals. Sliding scale   metFORMIN 1000 MG tablet Commonly known as:  GLUCOPHAGE Take 1,000 mg by mouth 2 (two) times daily with a meal.   metoprolol succinate 25 MG 24 hr tablet Commonly known as:  TOPROL-XL Take 25 mg by mouth daily.   nitrofurantoin 100 MG capsule Commonly known as:  MACRODANTIN Take 100 mg by mouth 2 (two) times daily.   ramipril 5 MG capsule Commonly known as:  ALTACE Take 5 mg by mouth daily.   rosuvastatin 20 MG tablet Commonly known as:  CRESTOR Take 20 mg by mouth daily.      DISCHARGE INSTRUCTIONS:   DIET:  Regular diet DISCHARGE CONDITION:  Good ACTIVITY:  Activity as tolerated OXYGEN:  Home Oxygen: No.  Oxygen Delivery: room air DISCHARGE LOCATION:  home   If you experience worsening of your admission symptoms, develop shortness of breath, life threatening emergency, suicidal or homicidal thoughts you must seek medical attention immediately by calling 911 or calling your MD immediately  if symptoms less severe.  You Must read complete instructions/literature along with all the possible adverse reactions/side effects for all the Medicines you take and that have been prescribed to you. Take any new Medicines after you have completely understood and  accpet all the possible adverse reactions/side effects.   Please note  You were cared for by a hospitalist during your hospital stay. If you have any questions about your discharge medications or the care you received while you were in the hospital after you are discharged, you can call the unit and asked to speak with the hospitalist on call if the hospitalist that took care of you is not available. Once you are discharged, your primary care physician will handle any further medical issues. Please note that NO REFILLS for any discharge medications will be authorized once you  are discharged, as it is imperative that you return to your primary care physician (or establish a relationship with a primary care physician if you do not have one) for your aftercare needs so that they can reassess your need for medications and monitor your lab values.    On the day of Discharge:  VITAL SIGNS:  Blood pressure 121/72, pulse 80, temperature 98 F (36.7 C), temperature source Oral, resp. rate 17, height 5\' 6"  (1.676 m), weight 80.7 kg, SpO2 98 %. PHYSICAL EXAMINATION:  GENERAL:  63 y.o.-year-old patient lying in the bed with no acute distress.  EYES: Pupils equal, round, reactive to light and accommodation. No scleral icterus. Extraocular muscles intact.  HEENT: Head atraumatic, normocephalic. Oropharynx and nasopharynx clear.  NECK:  Supple, no jugular venous distention. No thyroid enlargement, no tenderness.  LUNGS: Normal breath sounds bilaterally, no wheezing, rales,rhonchi or crepitation. No use of accessory muscles of respiration.  CARDIOVASCULAR: S1, S2 normal. No murmurs, rubs, or gallops.  ABDOMEN: Soft, non-tender, non-distended. Bowel sounds present. No organomegaly or mass.  EXTREMITIES: No pedal edema, cyanosis, or clubbing.  NEUROLOGIC: Cranial nerves II through XII are intact. Muscle strength 5/5 in all extremities. Sensation intact. Gait not checked.  PSYCHIATRIC: The patient is alert and oriented x 3.  SKIN: No obvious rash, lesion, or ulcer.  DATA REVIEW:   CBC Recent Labs  Lab 06/16/18 0441  WBC 5.7  HGB 14.1  HCT 42.3  PLT 323    Chemistries  Recent Labs  Lab 06/15/18 0554 06/15/18 1121 06/16/18 0441  NA 135  --  133*  K 3.3*  --  4.1  CL 98  --  101  CO2 27  --  23  GLUCOSE 127*  --  329*  BUN 10  --  10  CREATININE 0.52  --  0.38*  CALCIUM 8.8*  --  8.7*  MG  --  2.4  --   AST 18  --   --   ALT 19  --   --   ALKPHOS 54  --   --   BILITOT 0.5  --   --      Follow-up Information    Tisovec, Fransico Him, MD. Schedule an  appointment as soon as possible for a visit in 5 days.   Specialty:  Internal Medicine Why:  The office will call you with an appointment. Thanks! Contact information: [place] [address] [phone]        Wellington Hampshire, MD. Schedule an appointment as soon as possible for a visit in 2 weeks.   Specialty:  Cardiology Why:  The office will call you with an appointment. Thanks! Contact information: [place] [address] [phone]            Management plans discussed with the patient, family and they are in agreement.  CODE STATUS: Full Code  TOTAL TIME TAKING CARE OF THIS PATIENT: 45 minutes.    Max Sane M.D on 06/16/2018 at 4:27 PM  Between 7am to 6pm - Pager - [phone]  After 6pm go to [email or web address] - Proofreader  [facility]  Office  [phone]  CC: Primary care physician; Tisovec, Fransico Him, MD   Note: This dictation was prepared with Dragon dictation along with smaller phrase technology. Any transcriptional errors that result from this process are unintentional.

## 2018-06-16 NOTE — Progress Notes (Signed)
Progress Note  Patient Name: [name] Date of Encounter: 06/16/2018  Primary Cardiologist: Kathlyn Sacramento, MD  Subjective   No chest pain or sob.  For MV today.  Inpatient Medications    Scheduled Meds:  aspirin  81 mg Oral Daily   enoxaparin (LOVENOX) injection  40 mg Subcutaneous Q24H   insulin aspart  0-5 Units Subcutaneous QHS   insulin aspart  0-9 Units Subcutaneous TID WC   metoprolol tartrate  50 mg Oral BID   ramipril  5 mg Oral Daily   rosuvastatin  20 mg Oral q1800   Continuous Infusions:  PRN Meds: acetaminophen, ALPRAZolam, ondansetron (ZOFRAN) IV   Vital Signs    Vitals:   06/15/18 1925 06/15/18 2122 06/16/18 0423 06/16/18 0830  BP: 100/82 129/62 137/77 121/72  Pulse: 92 80 79 80  Resp:    17  Temp: 98.6 F (37 C)  98.2 F (36.8 C) 98 F (36.7 C)  TempSrc: Oral  Oral Oral  SpO2: 97%  98% 98%  Weight:      Height:        Intake/Output Summary (Last 24 hours) at 06/16/2018 1204 Last data filed at 06/16/2018 0444 Gross per 24 hour  Intake 240 ml  Output 1600 ml  Net -1360 ml   Filed Weights   06/15/18 0521  Weight: 80.7 kg    Physical Exam   GEN: Well nourished, well developed, in no acute distress.  HEENT: Grossly normal.  Neck: Supple, no JVD, carotid bruits, or masses. Cardiac: RRR, no murmurs, rubs, or gallops. No clubbing, cyanosis, edema.  Radials/DP/PT 2+ and equal bilaterally.  Respiratory:  Respirations regular and unlabored, bibasilar crackles. GI: Soft, nontender, nondistended, BS + x 4. MS: no deformity or atrophy. Skin: warm and dry, no rash. Neuro:  Strength and sensation are intact. Psych: AAOx3.  Normal affect.  Labs    Chemistry Recent Labs  Lab 06/15/18 0554 06/16/18 0441  NA 135 133*  K 3.3* 4.1  CL 98 101  CO2 27 23  GLUCOSE 127* 329*  BUN 10 10  CREATININE 0.52 0.38*  CALCIUM 8.8* 8.7*  PROT 7.1  --   ALBUMIN 4.2  --   AST 18  --   ALT 19  --   ALKPHOS 54  --   BILITOT 0.5  --     GFRNONAA >60 >60  GFRAA >60 >60  ANIONGAP 10 9     Hematology Recent Labs  Lab 06/15/18 0554 06/16/18 0441  WBC 5.9 5.7  RBC 5.11 4.89  HGB 14.7 14.1  HCT 43.4 42.3  MCV 84.9 86.5  MCH 28.8 28.8  MCHC 33.9 33.3  RDW 13.1 12.9  PLT 366 323    Cardiac Enzymes Recent Labs  Lab 06/15/18 0554 06/15/18 1121  TROPONINI <0.03 <0.03      Radiology    Ct Angio Chest Pe W/cm &/or Wo Cm  Result Date: 06/15/2018 CLINICAL DATA:  Shoulder blade pain.  Left-sided upper back pain EXAM: CT ANGIOGRAPHY CHEST WITH CONTRAST TECHNIQUE: Multidetector CT imaging of the chest was performed using the standard protocol during bolus administration of intravenous contrast. Multiplanar CT image reconstructions and MIPs were obtained to evaluate the vascular anatomy. CONTRAST:  34mL OMNIPAQUE IOHEXOL 350 MG/ML SOLN COMPARISON:  Chest x-ray 06/16/2004 FINDINGS: Cardiovascular: Satisfactory opacification of the pulmonary arteries to the segmental level. No evidence of pulmonary embolism. Normal heart size. No pericardial effusion. Coronary atherosclerotic calcifications seen along the LAD. Aortic atherosclerotic calcification. Mediastinum/Nodes: Negative for  adenopathy. 15 mm left thyroid nodule. Lungs/Pleura: Generalized airway thickening. 5 mm ground-glass nodule in the left upper lobe. There are a few small scattered cysts solid pulmonary nodules measuring up to 6 mm average diameter in the subpleural right lower lobe on images 78 and 63. There is no edema, consolidation, effusion, or pneumothorax. Upper Abdomen: Cholecystectomy. Musculoskeletal: No acute or aggressive finding Review of the MIP images confirms the above findings. IMPRESSION: 1. Negative for pulmonary embolism. 2. Generalized mild airway thickening. 3. Small pulmonary nodules measuring up to 6 mm average diameter. Non-contrast chest CT at 6-12 months is recommended. If the nodule is stable at time of repeat CT, then future CT at 18-24 months  (from today's scan) is considered optional for low-risk patients, but is recommended for high-risk patients. This recommendation follows the consensus statement: Guidelines for Management of Incidental Pulmonary Nodules Detected on CT Images: From the Fleischner Society 2017; Radiology 2017; 284:228-243. 4. Aortic and coronary atherosclerosis. 5. 15 mm left thyroid nodule, at the size threshold where elective sonographic follow-up is recommended. Electronically Signed   By: Monte Fantasia M.D.   On: 06/15/2018 07:03    Telemetry    Seen in nuc med - RSR - Personally Reviewed  Cardiac Studies   2D Echocardiogram 3.19.2020  IMPRESSIONS    1. The left ventricle has normal systolic function with an ejection fraction of 60-65%. The cavity size was normal. Left ventricular diastolic Doppler parameters are consistent with impaired relaxation.  2. The right ventricle has normal systolic function. The cavity was normal. There is no increase in right ventricular wall thickness. Normal RVSP _____________    Patient Profile     63 y.o. female w/ a h/o DMII, HL, and anemia, who was admitted w/ atypical c/p.  Assessment & Plan    1.  Atypical c/p:  Nl troponin.  No recurrence.  CTA neg for PE.  Echo w/ nl EF.  For MV this AM.  2.  HTN:  Cont acei.  Stable.  3.  HL:  Cont statin.  4.  Hypokalemia:  Stable - 4.1.  5.  DMII:  Cont SSI.  Per IM.  Signed, Murray Hodgkins, NP  06/16/2018, 12:04 PM    For questions or updates, please contact   Please consult [email or web address] for contact info under Cardiology/STEMI.

## 2018-06-16 NOTE — Telephone Encounter (Signed)
Patient is currently admitted and will need follow up appointment. Sending to pool for scheduling when possible.

## 2018-06-26 NOTE — Telephone Encounter (Signed)
LMOV to schedule follow up with APP Will try again at a later time

## 2018-06-26 NOTE — Telephone Encounter (Signed)
End, Harrell Gave, MD  P Cv Div [place] Scheduling        Patient will be discharged today from [facility]. She does not need a TOC visit but would benefit from some follow-up. Would it be possible to arrange for a phone call/e-visit in about 2 to 3 weeks to reassess symptoms? Thanks.   Gerald Stabs

## 2018-06-29 NOTE — Telephone Encounter (Signed)
Spoke with patient and offered virtual telephone or video TOC visit. Patient stated that she feels much better and would prefer to wait several months until able to get an in person visit. Advised to call in if any symptoms or concerns before that time, given her recent hospitalization.

## 2018-08-09 DIAGNOSIS — E1129 Type 2 diabetes mellitus with other diabetic kidney complication: Secondary | ICD-10-CM | POA: Diagnosis not present

## 2018-08-09 DIAGNOSIS — I10 Essential (primary) hypertension: Secondary | ICD-10-CM | POA: Diagnosis not present

## 2018-08-09 DIAGNOSIS — E7800 Pure hypercholesterolemia, unspecified: Secondary | ICD-10-CM | POA: Diagnosis not present

## 2018-08-10 ENCOUNTER — Other Ambulatory Visit: Payer: Self-pay | Admitting: Internal Medicine

## 2018-08-10 DIAGNOSIS — Z1231 Encounter for screening mammogram for malignant neoplasm of breast: Secondary | ICD-10-CM

## 2018-08-15 ENCOUNTER — Telehealth: Payer: Self-pay

## 2018-08-15 NOTE — Telephone Encounter (Signed)
Called patient.  Left message with spouse.  Awaiting call back.   Would like to offer her an in office appointment with Christell Faith, PA.

## 2018-08-16 NOTE — Telephone Encounter (Signed)
New recall placed for Aug.

## 2018-09-20 ENCOUNTER — Ambulatory Visit: Payer: 59 | Admitting: Nurse Practitioner

## 2018-09-20 ENCOUNTER — Other Ambulatory Visit: Payer: Self-pay

## 2018-09-20 ENCOUNTER — Telehealth: Payer: Self-pay

## 2018-09-20 ENCOUNTER — Encounter: Payer: Self-pay | Admitting: Nurse Practitioner

## 2018-09-20 VITALS — BP 138/74 | HR 86 | Ht 66.0 in | Wt 177.8 lb

## 2018-09-20 DIAGNOSIS — I10 Essential (primary) hypertension: Secondary | ICD-10-CM | POA: Diagnosis not present

## 2018-09-20 DIAGNOSIS — E782 Mixed hyperlipidemia: Secondary | ICD-10-CM | POA: Diagnosis not present

## 2018-09-20 DIAGNOSIS — R0789 Other chest pain: Secondary | ICD-10-CM | POA: Diagnosis not present

## 2018-09-20 DIAGNOSIS — I493 Ventricular premature depolarization: Secondary | ICD-10-CM | POA: Diagnosis not present

## 2018-09-20 MED ORDER — METOPROLOL SUCCINATE ER 25 MG PO TB24: 25.0000 mg | ORAL_TABLET | Freq: Every day | ORAL | 11 refills | Status: DC

## 2018-09-20 NOTE — Progress Notes (Signed)
E 

## 2018-09-20 NOTE — Progress Notes (Signed)
Office Visit    Patient Name: [name] Date of Encounter: 09/20/2018  Primary Care Provider:  Haywood Pao, MD Primary Cardiologist:  Kathlyn Sacramento, MD  Chief Complaint    63 y/o ? with a h/o DMII, HL, atypical chest pain, anemia, and diastolic dysfunction, who presents for f/u after chest pain hospitalization in March 2020.  Past Medical History    Past Medical History:  Diagnosis Date  . Anemia   . Arthritis   . Atypical chest pain    a. 05/2018 CTA chest: No PE; b. 05/2018 MV: No ischemia/scar. EF 65%.  . Coronary artery calcification seen on CT scan    a. 05/2018 CT Chest: Coronary and Ao atherosclerotic Calcifications.  . Diabetes mellitus without complication ([place])   . Diastolic dysfunction    a. 05/2018 Echo: EF 60-65%, impaired relaxation. Nl RVSP.  Marland Kitchen Family history of adverse reaction to anesthesia    sister-malignant hyperthermia  . FH of Malignant hyperthermia    a. sister had malginant hyperthermia  . History of renal insufficiency   . History of skin cancer   . Hyperlipemia   . Osteoporosis   . Pulmonary nodules    a. 05/2018 CT Chest: Small pulm nodules measuring up to 92mm avg diameter. Rec non-contrast Chest CT in 6-12 mos.   Past Surgical History:  Procedure Laterality Date  . CESAREAN SECTION    . CHOLECYSTECTOMY  2006   lap choli with umb hernia  . COLONOSCOPY    . LIPOMA EXCISION Left 03/15/2014   Procedure: EXCISION LIPOMA LEFT LOWER QUARDRANT ABDOMINAL WALL;  Surgeon: Georganna Skeans, MD;  Location: [place];  Service: General;  Laterality: Left;  . TONSILLECTOMY    . TRIGGER FINGER RELEASE  2012   lt small,middle,long  . TRIGGER FINGER RELEASE Right 07/14/2017   Procedure: RELEASE TRIGGER FINGER/A-1 PULLEY RIGHT THUMB;  Surgeon: Daryll Brod, MD;  Location: [place];  Service: Orthopedics;  Laterality: Right;  . UMBILICAL HERNIA REPAIR  2006   with lap choli    Allergies  No Known Allergies   History of Present Illness    63 y/o ? with the above PMH including DMII, HL, and anemia.  She was admitted to [facility] in March 2020 w/ atypical c/p and ruled out.  Echo showed nl EF.  CTA chest was neg for PE but did show coronary and Ao atherosclerotic calcifications, as well as small pulm nodules w/ rec for f/u CT in 6-12 mos.  Stress testing was undertaken and was non-ischemic and she was subsequently d/c'd home.  Since d/c, she has done well.  She denies chest pain, palpitations, dyspnea, pnd, orthopnea, n, v, dizziness, syncope, edema, weight gain, or early satiety.  She has remained active and has been maintaining appropriate social distancing and wearing a mask when in public.  She has run out of her Toprol and needs a refill.  Home Medications    Prior to Admission medications   Medication Sig Start Date End Date Taking? Authorizing Provider  alendronate (FOSAMAX) 70 MG tablet Take 70 mg by mouth once a week. Take with a full glass of water on an empty stomach.   Yes [provider]  aspirin 81 MG chewable tablet Chew 81 mg by mouth daily.   Yes [provider]  calcium carbonate (CALCIUM 600) 600 MG TABS tablet Take 600 mg by mouth daily.    Yes [provider]  Insulin Glargine (BASAGLAR KWIKPEN) 100  UNIT/ML SOPN Inject 35 Units into the skin at bedtime. 11/26/16  Yes [provider]  insulin lispro (HUMALOG) 100 UNIT/ML injection Inject 0-15 Units into the skin 3 (three) times daily before meals. Sliding scale   Yes [provider]  metFORMIN (GLUCOPHAGE) 1000 MG tablet Take 1,000 mg by mouth 2 (two) times daily with a meal.   Yes [provider]  metoprolol succinate (TOPROL-XL) 25 MG 24 hr tablet Take 25 mg by mouth daily. 05/31/18  Yes [provider]  nitrofurantoin (MACRODANTIN) 100 MG capsule Take 100 mg by mouth 2 (two) times daily. 06/08/18  Yes [provider]  ramipril (ALTACE) 5 MG capsule Take 5 mg by mouth  daily.    Yes [provider]  rosuvastatin (CRESTOR) 20 MG tablet Take 20 mg by mouth daily.   Yes [provider]    Review of Systems    She denies chest pain, palpitations, dyspnea, pnd, orthopnea, n, v, dizziness, syncope, edema, weight gain, or early satiety.  All other systems reviewed and are otherwise negative except as noted above.  Physical Exam    VS:  BP 138/74 (BP Location: Left Arm, Patient Position: Sitting, Cuff Size: Normal)   Pulse 86   Ht 5\' 6"  (1.676 m)   Wt 177 lb 12.8 oz (80.6 kg)   SpO2 98%   BMI 28.70 kg/m  , BMI Body mass index is 28.7 kg/m. GEN: Well nourished, well developed, in no acute distress. HEENT: normal. Neck: Supple, no JVD, carotid bruits, or masses. Cardiac: RRR, no murmurs, rubs, or gallops. No clubbing, cyanosis, edema.  Radials/DP/PT 2+ and equal bilaterally.  Respiratory:  Respirations regular and unlabored, clear to auscultation bilaterally. GI: Soft, nontender, nondistended, BS + x 4. MS: no deformity or atrophy. Skin: warm and dry, no rash. Neuro:  Strength and sensation are intact. Psych: Normal affect.  Accessory Clinical Findings    ECG personally reviewed by me today - RSR, PVC's, 86. Leftward axis, nonspecific ST changes - no acute changes.  Assessment & Plan    1.  Atypical Chest Pain/Coronary Ca2+: admission in March w/ c/p, r/o, and neg MV.  CTA chest neg for PE, though coronary and Ao Ca2+ noted.  She has done well since d/c w/o chest pain or dyspnea.  She remains on ASA and statin.  I will resume  blocker as she ran out recently.  2.  PVCs: resume  blocker.  Asymptomatic.  3.  Essential HTN:  BP elevated today. Resuming  blocker.  Cont acei.  4.  HL:  LDL 47 in March. Cont statin.  5.  DMII:  A1c 6.7 in March.  Mgmt per primary care.  On insulin, metformin, acei, and statin.  6.  Pulm nodules:  Noted on CT in March.  Needs f/u non-contrast CT in 6 - 12 mos.  7.  Dispo:  F/u 1 yr.    Murray Hodgkins, NP 09/20/2018, 5:10 PM

## 2018-09-20 NOTE — Patient Instructions (Signed)
Medication Instructions:  Your physician recommends that you continue on your current medications as directed. Please refer to the Current Medication list given to you today.  If you need a refill on your cardiac medications before your next appointment, please call your pharmacy.   Lab work: None ordered  If you have labs (blood work) drawn today and your tests are completely normal, you will receive your results only by: Marland Kitchen MyChart Message (if you have MyChart) OR . A paper copy in the mail If you have any lab test that is abnormal or we need to change your treatment, we will call you to review the results.  Testing/Procedures: None ordered   Follow-Up: At [facility], you and your health needs are our priority.  As part of our continuing mission to provide you with exceptional heart care, we have created designated Provider Care Teams.  These Care Teams include your primary Cardiologist (physician) and Advanced Practice Providers (APPs -  Physician Assistants and Nurse Practitioners) who all work together to provide you with the care you need, when you need it. You will need a follow up appointment in 12 months.  Please call our office 2 months in advance to schedule this appointment.  Please see Kathlyn Sacramento, MD.

## 2018-09-20 NOTE — Telephone Encounter (Signed)

## 2018-10-03 ENCOUNTER — Other Ambulatory Visit: Payer: Self-pay

## 2018-10-03 ENCOUNTER — Ambulatory Visit
Admission: RE | Admit: 2018-10-03 | Discharge: 2018-10-03 | Disposition: A | Discharge status: Home / Self Care | Payer: 59 | Source: Ambulatory Visit | Attending: Internal Medicine | Admitting: Internal Medicine

## 2018-10-03 DIAGNOSIS — Z1231 Encounter for screening mammogram for malignant neoplasm of breast: Secondary | ICD-10-CM

## 2018-12-01 ENCOUNTER — Other Ambulatory Visit: Payer: Self-pay

## 2018-12-01 DIAGNOSIS — I493 Ventricular premature depolarization: Secondary | ICD-10-CM

## 2018-12-01 MED ORDER — METOPROLOL SUCCINATE ER 25 MG PO TB24: 25.0000 mg | ORAL_TABLET | Freq: Every day | ORAL | 11 refills | Status: DC

## 2019-02-20 ENCOUNTER — Other Ambulatory Visit: Payer: Self-pay | Admitting: Internal Medicine

## 2019-02-20 DIAGNOSIS — R918 Other nonspecific abnormal finding of lung field: Secondary | ICD-10-CM

## 2019-02-20 DIAGNOSIS — E041 Nontoxic single thyroid nodule: Secondary | ICD-10-CM

## 2019-02-26 ENCOUNTER — Other Ambulatory Visit: Payer: Self-pay | Admitting: Internal Medicine

## 2019-02-26 DIAGNOSIS — R918 Other nonspecific abnormal finding of lung field: Secondary | ICD-10-CM

## 2019-02-26 DIAGNOSIS — E041 Nontoxic single thyroid nodule: Secondary | ICD-10-CM

## 2019-03-02 ENCOUNTER — Other Ambulatory Visit: Payer: Self-pay | Admitting: Internal Medicine

## 2019-03-02 ENCOUNTER — Other Ambulatory Visit: Payer: 59

## 2019-03-05 ENCOUNTER — Ambulatory Visit
Admission: RE | Admit: 2019-03-05 | Discharge: 2019-03-05 | Disposition: A | Discharge status: Home / Self Care | Payer: 59 | Source: Ambulatory Visit | Attending: Internal Medicine | Admitting: Internal Medicine

## 2019-03-05 DIAGNOSIS — E041 Nontoxic single thyroid nodule: Secondary | ICD-10-CM

## 2019-03-05 DIAGNOSIS — R918 Other nonspecific abnormal finding of lung field: Secondary | ICD-10-CM

## 2019-03-13 ENCOUNTER — Other Ambulatory Visit: Payer: Self-pay | Admitting: Internal Medicine

## 2019-03-13 DIAGNOSIS — E041 Nontoxic single thyroid nodule: Secondary | ICD-10-CM

## 2019-04-03 ENCOUNTER — Ambulatory Visit
Admission: RE | Admit: 2019-04-03 | Discharge: 2019-04-03 | Disposition: A | Discharge status: Home / Self Care | Payer: Medicare Other | Source: Ambulatory Visit | Attending: Internal Medicine | Admitting: Internal Medicine

## 2019-04-03 ENCOUNTER — Other Ambulatory Visit (HOSPITAL_COMMUNITY)
Admission: RE | Admit: 2019-04-03 | Discharge: 2019-04-03 | Disposition: A | Discharge status: Home / Self Care | Payer: Medicare Other | Source: Ambulatory Visit | Attending: Radiology | Admitting: Radiology

## 2019-04-03 DIAGNOSIS — E041 Nontoxic single thyroid nodule: Secondary | ICD-10-CM | POA: Diagnosis present

## 2019-04-04 LAB — CYTOLOGY - NON PAP

## 2019-04-20 ENCOUNTER — Encounter (HOSPITAL_COMMUNITY): Payer: Self-pay

## 2019-04-26 ENCOUNTER — Encounter (HOSPITAL_COMMUNITY): Payer: Self-pay

## 2019-05-09 ENCOUNTER — Ambulatory Visit: Payer: Self-pay | Admitting: Surgery

## 2019-05-10 ENCOUNTER — Other Ambulatory Visit: Payer: Self-pay

## 2019-05-10 ENCOUNTER — Ambulatory Visit: Payer: Medicare Other | Attending: Internal Medicine

## 2019-05-10 DIAGNOSIS — Z23 Encounter for immunization: Secondary | ICD-10-CM

## 2019-05-10 NOTE — Progress Notes (Signed)
   Covid-19 Vaccination Clinic  Name:  [name]    MRN: [id number] DOB: 1955-12-02  05/10/2019  [name] was observed post Covid-19 immunization for 15 minutes without incidence. She was provided with Vaccine Information Sheet and instruction to access the V-Safe system.   [name] was instructed to call 911 with any severe reactions post vaccine: Marland Kitchen Difficulty breathing  . Swelling of your face and throat  . A fast heartbeat  . A bad rash all over your body  . Dizziness and weakness    Immunizations Administered    Name Date Dose VIS Date Route   Pfizer COVID-19 Vaccine 05/10/2019 10:05 AM 0.3 mL 03/09/2019 Intramuscular   Manufacturer: [place]   Lot: [id number]   [place]: [id number]

## 2019-06-06 ENCOUNTER — Ambulatory Visit: Payer: Medicare Other | Attending: Internal Medicine

## 2019-06-06 DIAGNOSIS — Z23 Encounter for immunization: Secondary | ICD-10-CM | POA: Insufficient documentation

## 2019-06-06 NOTE — Progress Notes (Signed)
   Covid-19 Vaccination Clinic  Name:  [name]    MRN: [id number] DOB: Jan 14, 1956  06/06/2019  [name] was observed post Covid-19 immunization for 15 minutes without incident. She was provided with Vaccine Information Sheet and instruction to access the V-Safe system.   [name] was instructed to call 911 with any severe reactions post vaccine: Marland Kitchen Difficulty breathing  . Swelling of face and throat  . A fast heartbeat  . A bad rash all over body  . Dizziness and weakness   Immunizations Administered    Name Date Dose VIS Date Route   Pfizer COVID-19 Vaccine 06/06/2019 11:21 AM 0.3 mL 03/09/2019 Intramuscular   Manufacturer: [place]   Lot: [id number]   [place]: [id number]

## 2019-06-18 NOTE — Patient Instructions (Signed)
DUE TO COVID-19 ONLY TWO VISITOR IS ALLOWED TO COME WITH YOU AND STAY IN THE WAITING ROOM ONLY DURING PRE OP AND PROCEDURE DAY OF SURGERY. THE 2 VISITORS MAY VISIT WITH YOU AFTER SURGERY IN YOUR PRIVATE ROOM DURING VISITING HOURS ONLY!  YOU NEED TO HAVE A COVID 19 TEST ON: 06/21/19 @ 10:00 am, THIS TEST MUST BE DONE BEFORE SURGERY, COME  [place], [place] , [address].  ([place]) ONCE YOUR COVID TEST IS COMPLETED, PLEASE BEGIN THE QUARANTINE INSTRUCTIONS AS OUTLINED IN YOUR HANDOUT.                [name]    Your procedure is scheduled on: 06/25/19   Report to [facility] Main  Entrance   Report to admitting at: 7:00 AM     Call this number if you have problems the morning of surgery [phone]    Remember:   [place], NO [place].  How to Manage Your Diabetes Before and After Surgery  Why is it important to control my blood sugar before and after surgery? . Improving blood sugar levels before and after surgery helps healing and can limit problems. . A way of improving blood sugar control is eating a healthy diet by: o  Eating less sugar and carbohydrates o  Increasing activity/exercise o  Talking with your doctor about reaching your blood sugar goals . High blood sugars (greater than 180 mg/dL) can raise your risk of infections and slow your recovery, so you will need to focus on controlling your diabetes during the weeks before surgery. . Make sure that the doctor who takes care of your diabetes knows about your planned surgery including the date and location.  How do I manage my blood sugar before surgery? . Check your blood sugar at least 4 times a day, starting 2 days before surgery, to make sure that the level is not too high or low. o Check your blood sugar the morning of your surgery when you wake up and every 2 hours until you get to the Short Stay  unit. . If your blood sugar is less than 70 mg/dL, you will need to treat for low blood sugar: o Do not take insulin. o Treat a low blood sugar (less than 70 mg/dL) with  cup of clear juice (cranberry or apple), 4 glucose tablets, OR glucose gel. o Recheck blood sugar in 15 minutes after treatment (to make sure it is greater than 70 mg/dL). If your blood sugar is not greater than 70 mg/dL on recheck, call [phone] for further instructions. . Report your blood sugar to the short stay nurse when you get to Short Stay.  . If you are admitted to the hospital after surgery: o Your blood sugar will be checked by the staff and you will probably be given insulin after surgery (instead of oral diabetes medicines) to make sure you have good blood sugar levels. o The goal for blood sugar control after surgery is 80-180 mg/dL.   WHAT DO I DO ABOUT MY DIABETES MEDICATION?  Marland Kitchen Do not take oral diabetes medicines (pills) the morning of surgery.  . THE DAY BEFORE SURGERY, take usual dose of Lispro Humalog for breakfast and lunch. Do not take the lispro at bedtime.   . Take only half of the usual dose of glargine insulin.   . Take the usual dose of your Metformin.      Marland Kitchen  THE MORNING OF SURGERY, If CBG is more than 220 mg/dl,may take half of the usual(slidding scale) dose.  . The day of surgery, do not take Metformin.   DO NOT TAKE ANY DIABETIC MEDICATIONS DAY OF YOUR SURGERY                               You may not have any metal on your body including hair pins and              piercings  Do not wear jewelry, make-up, lotions, powders or perfumes, deodorant             Do not wear nail polish on your fingernails.  Do not shave  48 hours prior to surgery.               Do not bring valuables to the hospital. [place].  Contacts, dentures or bridgework may not be worn into surgery.  Leave suitcase in the car. After surgery it may be brought to  your room.              NO SOLID FOOD AFTER MIDNIGHT THE NIGHT PRIOR TO SURGERY. NOTHING BY MOUTH EXCEPT CLEAR LIQUIDS UNTIL: 6:00 am.   CLEAR LIQUID DIET   Foods Allowed                                                                     Foods Excluded  Coffee and tea, regular and decaf                             liquids that you cannot  Plain Jell-O any favor except red or purple                                           see through such as: Fruit ices (not with fruit pulp)                                     milk, soups, orange juice  Iced Popsicles                                    All solid food Carbonated beverages, regular and diet                                    Cranberry, grape and apple juices Sports drinks like Gatorade Lightly seasoned clear broth or consume(fat free) Sugar, honey syrup  Sample Menu Breakfast                                Lunch  Supper Cranberry juice                    Beef broth                            Chicken broth Jell-O                                     Grape juice                           Apple juice Coffee or tea                        Jell-O                                      Popsicle                                                Coffee or tea                        Coffee or tea  _____________________________________________________________________  [facility] - Preparing for Surgery Before surgery, you can play an important role.  Because skin is not sterile, your skin needs to be as free of germs as possible.  You can reduce the number of germs on your skin by washing with CHG (chlorahexidine gluconate) soap before surgery.  CHG is an antiseptic cleaner which kills germs and bonds with the skin to continue killing germs even after washing. Please DO NOT use if you have an allergy to CHG or antibacterial soaps.  If your skin becomes reddened/irritated stop using the CHG and inform your nurse  when you arrive at Short Stay. Do not shave (including legs and underarms) for at least 48 hours prior to the first CHG shower.  You may shave your face/neck. Please follow these instructions carefully:  1.  Shower with CHG Soap the night before surgery and the  morning of Surgery.  2.  If you choose to wash your hair, wash your hair first as usual with your  normal  shampoo.  3.  After you shampoo, rinse your hair and body thoroughly to remove the  shampoo.                           4.  Use CHG as you would any other liquid soap.  You can apply chg directly  to the skin and wash                       Gently with a scrungie or clean washcloth.  5.  Apply the CHG Soap to your body ONLY FROM THE NECK DOWN.   Do not use on face/ open                           Wound or open sores. Avoid contact with eyes, ears mouth and genitals (private parts).  Wash face,  Genitals (private parts) with your normal soap.             6.  Wash thoroughly, paying special attention to the area where your surgery  will be performed.  7.  Thoroughly rinse your body with warm water from the neck down.  8.  DO NOT shower/wash with your normal soap after using and rinsing off  the CHG Soap.                9.  Pat yourself dry with a clean towel.            10.  Wear clean pajamas.            11.  Place clean sheets on your bed the night of your first shower and do not  sleep with pets. Day of Surgery : Do not apply any lotions/deodorants the morning of surgery.  Please wear clean clothes to the hospital/surgery center.  FAILURE TO FOLLOW THESE INSTRUCTIONS MAY RESULT IN THE CANCELLATION OF YOUR SURGERY PATIENT SIGNATURE_________________________________  NURSE SIGNATURE__________________________________  ________________________________________________________________________

## 2019-06-19 ENCOUNTER — Encounter (HOSPITAL_COMMUNITY): Payer: Self-pay

## 2019-06-19 ENCOUNTER — Encounter (HOSPITAL_COMMUNITY): Admission: RE | Admit: 2019-06-19 | Payer: Medicare Other | Source: Ambulatory Visit

## 2019-06-19 ENCOUNTER — Encounter (HOSPITAL_COMMUNITY)
Admission: RE | Admit: 2019-06-19 | Discharge: 2019-06-19 | Disposition: A | Discharge status: Home / Self Care | Payer: Medicare Other | Source: Ambulatory Visit | Attending: Surgery | Admitting: Surgery

## 2019-06-19 ENCOUNTER — Encounter (HOSPITAL_COMMUNITY): Payer: Self-pay | Admitting: Surgery

## 2019-06-19 ENCOUNTER — Other Ambulatory Visit: Payer: Self-pay

## 2019-06-19 ENCOUNTER — Ambulatory Visit (HOSPITAL_COMMUNITY)
Admission: RE | Admit: 2019-06-19 | Discharge: 2019-06-19 | Disposition: A | Discharge status: Home / Self Care | Payer: Medicare Other | Source: Ambulatory Visit | Attending: Anesthesiology | Admitting: Anesthesiology

## 2019-06-19 DIAGNOSIS — D497 Neoplasm of unspecified behavior of endocrine glands and other parts of nervous system: Secondary | ICD-10-CM | POA: Diagnosis not present

## 2019-06-19 DIAGNOSIS — D440 Neoplasm of uncertain behavior of thyroid gland: Secondary | ICD-10-CM | POA: Diagnosis present

## 2019-06-19 DIAGNOSIS — E042 Nontoxic multinodular goiter: Secondary | ICD-10-CM | POA: Diagnosis present

## 2019-06-19 HISTORY — DX: Nontoxic single thyroid nodule: E04.1

## 2019-06-19 HISTORY — DX: Cardiac arrhythmia, unspecified: I49.9

## 2019-06-19 LAB — BASIC METABOLIC PANEL
Anion gap: 13 (ref 5–15)
BUN: 12 mg/dL (ref 8–23)
CO2: 25 mmol/L (ref 22–32)
Calcium: 9.8 mg/dL (ref 8.9–10.3)
Chloride: 97 mmol/L — ABNORMAL LOW (ref 98–111)
Creatinine, Ser: 0.59 mg/dL (ref 0.44–1.00)
GFR calc Af Amer: >60 mL/min (ref >60)
GFR calc non Af Amer: >60 mL/min (ref >60)
Glucose, Bld: 265 mg/dL — ABNORMAL HIGH (ref 70–99)
Potassium: 3.8 mmol/L (ref 3.5–5.1)
Sodium: 135 mmol/L (ref 135–145)

## 2019-06-19 LAB — CBC
HCT: 42.8 % (ref 36.0–46.0)
Hemoglobin: 14.2 g/dL (ref 12.0–15.0)
MCH: 29.3 pg (ref 26.0–34.0)
MCHC: 33.2 g/dL (ref 30.0–36.0)
MCV: 88.4 fL (ref 80.0–100.0)
Platelets: 370 10*3/uL (ref 150–400)
RBC: 4.84 MIL/uL (ref 3.87–5.11)
RDW: 12.7 % (ref 11.5–15.5)
WBC: 7.8 10*3/uL (ref 4.0–10.5)
nRBC: 0 % (ref 0.0–0.2)

## 2019-06-19 NOTE — Progress Notes (Signed)
PCP - Dr.Richard Tisovec Cardiologist - Dr. Rod Can. PAC: CSharolyn Douglas. LOV: 09/20/18. EPIC  Chest x-ray - CT: 03/05/19 EKG - 08/31/18. EPIC Stress Test -  ECHO - 3/20. Cardiac Cath -   Sleep Study -  CPAP -   Fasting Blood Sugar -  Checks Blood Sugar _____ times a day  Blood Thinner Instructions:RN advise Pt. To call MD. For instructions. Aspirin Instructions: Last Dose:  Anesthesia review: Hx. Of diastolic dysfunction,chest pain  Patient denies shortness of breath, fever, cough and chest pain at PAT appointment   Patient verbalized understanding of instructions that were given to them at the PAT appointment. Patient was also instructed that they will need to review over the PAT instructions again at home before surgery.

## 2019-06-19 NOTE — H&P (Signed)
General Surgery [facility]  Ernest Haber DOB: Feb 18, 1956 Married / Language: [name] / Race: White Female   History of Present Illness  The patient is a 64 year old female who presents with a thyroid nodule.  CHIEF COMPLAINT: thyroid neoplasm of uncertain behavior, multiple thyroid nodules  Patient is referred by Dr. Domenick Gong for surgical evaluation and management of a thyroid neoplasm of uncertain behavior. Patient had been undergoing a cardiac evaluation early in 2020. As part of her evaluation she underwent a CT scan of the chest and had an incidental finding of thyroid nodules. Patient subsequently underwent a thyroid ultrasound on March 05, 2019. This identified a 3.1 cm nodule in the inferior right thyroid lobe as well as a 1.2 cm nodule in the mid left thyroid lobe. The patient and I reviewed this study together today. Patient underwent biopsy of the dominant nodule on the right side on April 03, 2019. This showed cytologic atypia, Bethesda category III. specimen was sent for molecular genetic testing and returned with a result of suspicious, rendering a risk of malignancy of 50%. TSH level was normal at 1.79. Patient has no prior history of thyroid disease. She has never been on thyroid medication. She has had no prior head or neck surgery. There is a family history of thyroid disease and the patient's mother who possibly had thyroid cancer. There is also a history of thyroid disease and the patient's daughter who required treatment with radioactive iodine. Patient also has 2 sisters who have been diagnosed with breast cancer. Patient denies any compressive symptoms. She presents today to discuss thyroid surgery.   Past Surgical History  Cesarean Section - 1  Colon Polyp Removal - Colonoscopy  Gallbladder Surgery - Laparoscopic  Tonsillectomy   Diagnostic Studies History  Colonoscopy  within last year Mammogram  within last  year Pap Smear  1-5 years ago  Allergies No Known Allergies   Medication History  OneTouch Ultra Blue (In Vitro) Specific strength unknown - Active. Metoprolol Succinate ER (25MG  Tablet ER 24HR, Oral) Active. Alendronate Sodium (Oral) Specific strength unknown - Active. Aspirin (81MG  Tablet Chewable, Oral) Active. Ciprofloxacin HCl (250MG  Tablet, Oral) Active. HumaLOG (Subcutaneous) Specific strength unknown - Active. MetFORMIN HCl (Oral) Specific strength unknown - Active. Ramipril (Oral) Specific strength unknown - Active. Crestor (20MG  Tablet, Oral) Active. Basaglar KwikPen (100UNIT/ML Soln Pen-inj, Subcutaneous) Active. Medications Reconciled  Social History Caffeine use  Carbonated beverages, Coffee, Tea. No alcohol use  No drug use  Tobacco use  Never smoker.  Family History  Arthritis  Sister. Breast Cancer  Sister. Diabetes Mellitus  Father. Heart Disease  Mother, Son. Heart disease in female family member before age 98  Ischemic Bowel Disease  Father. Migraine Headache  Mother. Respiratory Condition  Father. Thyroid problems  Daughter, Mother.  Pregnancy / Birth History  Age at menarche  8 years. Age of menopause  [age] Contraceptive History  Intrauterine device, Oral contraceptives. Gravida  2 Length (months) of breastfeeding  3-6 Maternal age  36-25 26-30 Para  2  Other Problems  Cholelithiasis  Diabetes Mellitus  Hypercholesterolemia  Thyroid Cancer  Thyroid Disease   Review of Systems  General Present- Fatigue and Night Sweats. Not Present- Appetite Loss, Chills, Fever, Weight Gain and Weight Loss. Skin Present- Dryness. Not Present- Change in Wart/Mole, Hives, Jaundice, New Lesions, Non-Healing Wounds, Rash and Ulcer. HEENT Present- Wears glasses/contact lenses. Not Present- Earache, Hearing Loss, Hoarseness, Nose Bleed, Oral Ulcers, Ringing in the Ears, Seasonal Allergies,  Sinus Pain, Sore Throat, Visual  Disturbances and Yellow Eyes. Respiratory Not Present- Bloody sputum, Chronic Cough, Difficulty Breathing, Snoring and Wheezing. Breast Not Present- Breast Mass, Breast Pain, Nipple Discharge and Skin Changes. Cardiovascular Present- Shortness of Breath. Not Present- Chest Pain, Difficulty Breathing Lying Down, Leg Cramps, Palpitations, Rapid Heart Rate and Swelling of Extremities. Gastrointestinal Not Present- Abdominal Pain, Bloating, Bloody Stool, Change in Bowel Habits, Chronic diarrhea, Constipation, Difficulty Swallowing, Excessive gas, Gets full quickly at meals, Hemorrhoids, Indigestion, Nausea, Rectal Pain and Vomiting. Female Genitourinary Not Present- Frequency, Nocturia, Painful Urination, Pelvic Pain and Urgency. Musculoskeletal Not Present- Back Pain, Joint Pain, Joint Stiffness, Muscle Pain, Muscle Weakness and Swelling of Extremities. Neurological Not Present- Decreased Memory, Fainting, Headaches, Numbness, Seizures, Tingling, Tremor, Trouble walking and Weakness. Psychiatric Not Present- Anxiety, Bipolar, Change in Sleep Pattern, Depression, Fearful and Frequent crying. Endocrine Present- Hot flashes. Not Present- Cold Intolerance, Excessive Hunger, Hair Changes, Heat Intolerance and New Diabetes. Hematology Not Present- Blood Thinners, Easy Bruising, Excessive bleeding, Gland problems, HIV and Persistent Infections.  Vitals  Weight: 180.13 lb Height: 66in Body Surface Area: 1.91 m Body Mass Index: 29.07 kg/m  Temp.: 97.67F  Pulse: 95 (Regular)  BP: 130/70 (Sitting, Left Arm, Standard)  Physical Exam  GENERAL APPEARANCE Development: normal Nutritional status: normal Gross deformities: none  SKIN Rash, lesions, ulcers: none Induration, erythema: none Nodules: none palpable  EYES Conjunctiva and lids: normal Pupils: equal and reactive Iris: normal bilaterally  EARS, NOSE, MOUTH, THROAT External ears: no lesion or deformity External nose: no lesion or  deformity Hearing: grossly normal Patient is wearing a mask.  NECK Symmetric: yes Trachea: midline Thyroid: On palpation, there is a firm 3 cm nodule in the inferior right thyroid lobe which is smooth and mobile with swallowing. It is best palpated with swallowing maneuver and extends beneath the right clavicle. Palpation of the left thyroid lobe shows no dominant or discrete mass. There are no supraclavicular masses. There is no associated lymphadenopathy.  CHEST Respiratory effort: normal Retraction or accessory muscle use: no Breath sounds: normal bilaterally Rales, rhonchi, wheeze: none  CARDIOVASCULAR Auscultation: regular rhythm, normal rate Murmurs: none Pulses: carotid and radial pulse 2+ palpable Lower extremity edema: none  MUSCULOSKELETAL Station and gait: normal Digits and nails: no clubbing or cyanosis Muscle strength: grossly normal all extremities Range of motion: grossly normal all extremities Deformity: none  LYMPHATIC Cervical: none palpable Supraclavicular: none palpable  PSYCHIATRIC Oriented to person, place, and time: yes Mood and affect: normal for situation Judgment and insight: appropriate for situation    Assessment & Plan  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 Patient provided with a copy of "The Thyroid Book: Medical and Surgical Treatment of Thyroid Problems", published by [employer], 16 pages. Book reviewed and explained to patient during visit today.  We reviewed her diagnostic studies and her cytopathology report as well as her molecular genetic test results. Given that the patient has bilateral thyroid nodules and a 50% risk of malignancy, I have recommended proceeding with total thyroidectomy. We discussed the procedure in  detail. We discussed the location of the surgical incision. We discussed the risk of surgery including the risk of recurrent laryngeal nerve injury to parathyroid glands. We discussed the hospital stay anticipated. We discussed postoperative recovery. We discussed the need for lifelong thyroid replacement. We discussed the  potential need for radioactive iodine treatment. The patient understands and wishes to proceed with surgery in the future.  The risks and benefits of the procedure have been discussed at length with the patient. The patient understands the proposed procedure, potential alternative treatments, and the course of recovery to be expected. All of the patient's questions have been answered at this time. The patient wishes to proceed with surgery.  Armandina Gemma, MD [facility] Office: [phone]

## 2019-06-21 ENCOUNTER — Other Ambulatory Visit (HOSPITAL_COMMUNITY)
Admission: RE | Admit: 2019-06-21 | Discharge: 2019-06-21 | Disposition: A | Discharge status: Home / Self Care | Payer: Medicare Other | Source: Ambulatory Visit | Attending: Surgery | Admitting: Surgery

## 2019-06-21 DIAGNOSIS — Z20822 Contact with and (suspected) exposure to covid-19: Secondary | ICD-10-CM | POA: Diagnosis not present

## 2019-06-21 DIAGNOSIS — Z01812 Encounter for preprocedural laboratory examination: Secondary | ICD-10-CM | POA: Diagnosis present

## 2019-06-21 LAB — SARS CORONAVIRUS 2 (TAT 6-24 HRS): SARS Coronavirus 2: NEGATIVE

## 2019-06-21 NOTE — Anesthesia Preprocedure Evaluation (Addendum)
Anesthesia Evaluation  Patient identified by MRN, date of birth, ID band Patient awake    Reviewed: Allergy & Precautions, NPO status , Patient's Chart, lab work & pertinent test results  History of Anesthesia Complications (+) MALIGNANT HYPERTHERMIA, Family history of anesthesia reaction and history of anesthetic complications  Airway Mallampati: II  TM Distance: >3 FB Neck ROM: Full    Dental  (+) Teeth Intact, Dental Advisory Given   Pulmonary neg pulmonary ROS,    Pulmonary exam normal        Cardiovascular + CAD  + dysrhythmias  Rhythm:Regular Rate:Normal     Neuro/Psych negative neurological ROS  negative psych ROS   GI/Hepatic negative GI ROS, Neg liver ROS,   Endo/Other  diabetes  Renal/GU negative Renal ROS     Musculoskeletal  (+) Arthritis ,   Abdominal Normal abdominal exam  (+)   Peds  Hematology negative hematology ROS (+)   Anesthesia Other Findings   Reproductive/Obstetrics                           Anesthesia Physical Anesthesia Plan  ASA: II  Anesthesia Plan: General   Post-op Pain Management:    Induction: Intravenous  PONV Risk Score and Plan: 4 or greater and TIVA, Midazolam, Ondansetron and Dexamethasone  Airway Management Planned: Oral ETT  Additional Equipment: None  Intra-op Plan:   Post-operative Plan: Extubation in OR  Informed Consent: I have reviewed the patients History and Physical, chart, labs and discussed the procedure including the risks, benefits and alternatives for the proposed anesthesia with the patient or authorized representative who has indicated his/her understanding and acceptance.     Dental advisory given  Plan Discussed with: CRNA  Anesthesia Plan Comments: (See PAT note 06/19/19, Konrad Felix, PA-C)      Anesthesia Quick Evaluation

## 2019-06-21 NOTE — Progress Notes (Signed)
Case: [id number] Date/Time: 06/25/19 0845   Procedure: TOTAL THYROIDECTOMY (N/A )   Anesthesia type: General   Pre-op diagnosis: THYROID NEOPLASM OF UNCERTAIN BEHAVIOR   Location: [facility] ROOM 04 / [facility]   Surgeons: Armandina Gemma, MD      DISCUSSION:64 y.o. never smoker with h/o DM II, CAD, PVC's, atypical chest pain, thyroid neoplasm scheduled for above procedure 06/25/19 with Dr. Armandina Gemma.   Pt reports family history of malignant hyperthermia, sister.    Pt last seen by cardiology 09/20/2018.  Per OV note, "Atypical chest pain/Coronary Ca2+: admission in March (2020) w/ c/p r/o and neg MV.  CTA chest neg for PE, though coronary and Ao Ca2+ noted.  She has done well since d/c w/o chest pain or dyspnea remains on ASA and statin." 1 year follow up recommended.    Anticipate pt can proceed with planned procedure barring acute status change.   VS: BP (!) 148/86 (BP Location: Left Arm)   Pulse 95   Temp 36.7 C (Oral)   Resp 16   Ht 5\' 6"  (1.676 m)   Wt 81.8 kg   SpO2 98%   BMI 29.12 kg/m   PROVIDERS: Tisovec, Fransico Him, MD is PCP   Kathlyn Sacramento, MD is cardiologist  LABS: Labs reviewed: Acceptable for surgery. (all labs ordered are listed, but only abnormal results are displayed)  Labs Reviewed  BASIC METABOLIC PANEL - Abnormal; Notable for the following components:      Result Value   Chloride 97 (*)    Glucose, Bld 265 (*)    All other components within normal limits  CBC     IMAGES:   EKG: 09/20/2018 Rate 86 bpm Sinus rhythm with frequent premature ventricular complexes Low voltage QRS Nonspecific ST abnormality  CV: Myocardial Perfusion 06/16/2018  Normal pharmacologic myocardial perfusion stress test without significant ischemia or scar.  The left ventricular ejection fraction is normal (65%).  Attenuation correction CT shows coronary artery and aortic atherosclerotic calcification.  This is a low risk study.  Echo 06/15/2018 IMPRESSIONS    1. The left  ventricle has normal systolic function with an ejection  fraction of 60-65%. The cavity size was normal. Left ventricular diastolic  Doppler parameters are consistent with impaired relaxation.  2. The right ventricle has normal systolic function. The cavity was  normal. There is no increase in right ventricular wall thickness. Normal  RVSP Past Medical History:  Diagnosis Date  . Anemia   . Arthritis   . Atypical chest pain    a. 05/2018 CTA chest: No PE; b. 05/2018 MV: No ischemia/scar. EF 65%.  . Coronary artery calcification seen on CT scan    a. 05/2018 CT Chest: Coronary and Ao atherosclerotic Calcifications.  . Diabetes mellitus without complication ([place])   . Diastolic dysfunction    a. 05/2018 Echo: EF 60-65%, impaired relaxation. Nl RVSP.  Marland Kitchen Dysrhythmia    PVC's  . Family history of adverse reaction to anesthesia    sister-malignant hyperthermia  . FH of Malignant hyperthermia    a. sister had malginant hyperthermia  . History of renal insufficiency   . History of skin cancer   . Hyperlipemia   . Osteoporosis   . Pulmonary nodules    a. 05/2018 CT Chest: Small pulm nodules measuring up to 45mm avg diameter. Rec non-contrast Chest CT in 6-12 mos.  . Thyroid nodule     Past Surgical History:  Procedure Laterality Date  . CESAREAN SECTION    . CHOLECYSTECTOMY  2006   lap choli with umb hernia  . COLONOSCOPY    . LIPOMA EXCISION Left 03/15/2014   Procedure: EXCISION LIPOMA LEFT LOWER QUARDRANT ABDOMINAL WALL;  Surgeon: Georganna Skeans, MD;  Location: [place];  Service: General;  Laterality: Left;  . TONSILLECTOMY    . TRIGGER FINGER RELEASE  2012   lt small,middle,long  . TRIGGER FINGER RELEASE Right 07/14/2017   Procedure: RELEASE TRIGGER FINGER/A-1 PULLEY RIGHT THUMB;  Surgeon: Daryll Brod, MD;  Location: [place];  Service: Orthopedics;  Laterality: Right;  . UMBILICAL HERNIA REPAIR  2006   with lap choli    MEDICATIONS: .  alendronate (FOSAMAX) 70 MG tablet  . aspirin EC 81 MG tablet  . Calcium Carb-Cholecalciferol (CALCIUM 600 + D PO)  . calcium carbonate (CALCIUM 600) 600 MG TABS tablet  . ciprofloxacin (CIPRO) 250 MG tablet  . Insulin Glargine (BASAGLAR KWIKPEN) 100 UNIT/ML SOPN  . insulin lispro (HUMALOG) 100 UNIT/ML injection  . metFORMIN (GLUCOPHAGE) 1000 MG tablet  . metoprolol succinate (TOPROL-XL) 25 MG 24 hr tablet  . Nutritional Supplements (JUICE PLUS FIBRE PO)  . ramipril (ALTACE) 5 MG capsule  . rosuvastatin (CRESTOR) 20 MG tablet  . vitamin C (ASCORBIC ACID) 250 MG tablet   No current facility-administered medications for this encounter.    Konrad Felix, PA-C [facility] [phone] 06/21/19  1:20 PM

## 2019-06-25 ENCOUNTER — Ambulatory Visit (HOSPITAL_COMMUNITY)
Admission: RE | Admit: 2019-06-25 | Discharge: 2019-06-26 | Disposition: A | Discharge status: Home / Self Care | Payer: Medicare Other | Source: Ambulatory Visit | Attending: Surgery | Admitting: Surgery

## 2019-06-25 ENCOUNTER — Other Ambulatory Visit: Payer: Self-pay

## 2019-06-25 ENCOUNTER — Ambulatory Visit (HOSPITAL_COMMUNITY): Payer: Medicare Other | Admitting: Physician Assistant

## 2019-06-25 ENCOUNTER — Encounter (HOSPITAL_COMMUNITY): Payer: Self-pay | Admitting: Surgery

## 2019-06-25 ENCOUNTER — Ambulatory Visit (HOSPITAL_COMMUNITY): Payer: Medicare Other | Admitting: Certified Registered"

## 2019-06-25 ENCOUNTER — Encounter (HOSPITAL_COMMUNITY)
Admission: RE | Disposition: A | Discharge status: Home / Self Care | Payer: Self-pay | Source: Ambulatory Visit | Attending: Surgery

## 2019-06-25 DIAGNOSIS — E119 Type 2 diabetes mellitus without complications: Secondary | ICD-10-CM | POA: Diagnosis not present

## 2019-06-25 DIAGNOSIS — Z79899 Other long term (current) drug therapy: Secondary | ICD-10-CM | POA: Diagnosis not present

## 2019-06-25 DIAGNOSIS — Z7982 Long term (current) use of aspirin: Secondary | ICD-10-CM | POA: Insufficient documentation

## 2019-06-25 DIAGNOSIS — Z7983 Long term (current) use of bisphosphonates: Secondary | ICD-10-CM | POA: Insufficient documentation

## 2019-06-25 DIAGNOSIS — D440 Neoplasm of uncertain behavior of thyroid gland: Secondary | ICD-10-CM | POA: Diagnosis present

## 2019-06-25 DIAGNOSIS — Z8349 Family history of other endocrine, nutritional and metabolic diseases: Secondary | ICD-10-CM | POA: Diagnosis not present

## 2019-06-25 DIAGNOSIS — E7800 Pure hypercholesterolemia, unspecified: Secondary | ICD-10-CM | POA: Diagnosis not present

## 2019-06-25 DIAGNOSIS — I2510 Atherosclerotic heart disease of native coronary artery without angina pectoris: Secondary | ICD-10-CM | POA: Insufficient documentation

## 2019-06-25 DIAGNOSIS — Z803 Family history of malignant neoplasm of breast: Secondary | ICD-10-CM | POA: Diagnosis not present

## 2019-06-25 DIAGNOSIS — E042 Nontoxic multinodular goiter: Secondary | ICD-10-CM | POA: Insufficient documentation

## 2019-06-25 DIAGNOSIS — Z794 Long term (current) use of insulin: Secondary | ICD-10-CM | POA: Insufficient documentation

## 2019-06-25 HISTORY — PX: THYROIDECTOMY: SHX17

## 2019-06-25 LAB — GLUCOSE, CAPILLARY
Glucose-Capillary: 115 mg/dL — ABNORMAL HIGH (ref 70–99)
Glucose-Capillary: 302 mg/dL — ABNORMAL HIGH (ref 70–99)
Glucose-Capillary: 252 mg/dL — ABNORMAL HIGH (ref 70–99)

## 2019-06-25 SURGERY — THYROIDECTOMY
Anesthesia: General

## 2019-06-25 MED ORDER — MIDAZOLAM HCL 2 MG/2ML IJ SOLN
INTRAMUSCULAR | Status: AC
  Filled 2019-06-25: qty 2

## 2019-06-25 MED ORDER — ONDANSETRON HCL 4 MG/2ML IJ SOLN
INTRAMUSCULAR | Status: DC | PRN
  Administered 2019-06-25: 4 mg via INTRAVENOUS

## 2019-06-25 MED ORDER — PROMETHAZINE HCL 25 MG/ML IJ SOLN: 6.2500 mg | INTRAMUSCULAR | Status: DC | PRN

## 2019-06-25 MED ORDER — PROPOFOL 500 MG/50ML IV EMUL
INTRAVENOUS | Status: DC | PRN
  Administered 2019-06-25: 150 ug/kg/min via INTRAVENOUS

## 2019-06-25 MED ORDER — PROPOFOL 10 MG/ML IV BOLUS
INTRAVENOUS | Status: AC
  Filled 2019-06-25: qty 40

## 2019-06-25 MED ORDER — CHLORHEXIDINE GLUCONATE CLOTH 2 % EX PADS: 6.0000 | MEDICATED_PAD | Freq: Once | CUTANEOUS | Status: DC

## 2019-06-25 MED ORDER — ROCURONIUM BROMIDE 10 MG/ML (PF) SYRINGE
PREFILLED_SYRINGE | INTRAVENOUS | Status: AC
  Filled 2019-06-25: qty 10

## 2019-06-25 MED ORDER — ACETAMINOPHEN 160 MG/5ML PO SOLN: 325.0000 mg | Freq: Once | ORAL | Status: DC | PRN

## 2019-06-25 MED ORDER — LIDOCAINE 2% (20 MG/ML) 5 ML SYRINGE
INTRAMUSCULAR | Status: DC | PRN
  Administered 2019-06-25: 40 mg via INTRAVENOUS

## 2019-06-25 MED ORDER — ONDANSETRON HCL 4 MG/2ML IJ SOLN: 4.0000 mg | Freq: Four times a day (QID) | INTRAMUSCULAR | Status: DC | PRN

## 2019-06-25 MED ORDER — DEXAMETHASONE SODIUM PHOSPHATE 10 MG/ML IJ SOLN
INTRAMUSCULAR | Status: AC
  Filled 2019-06-25: qty 1

## 2019-06-25 MED ORDER — HYDROMORPHONE HCL 1 MG/ML IJ SOLN: 1.0000 mg | INTRAMUSCULAR | Status: DC | PRN

## 2019-06-25 MED ORDER — LABETALOL HCL 5 MG/ML IV SOLN
5.0000 mg | INTRAVENOUS | Status: DC | PRN
  Administered 2019-06-25: 5 mg via INTRAVENOUS

## 2019-06-25 MED ORDER — DEXAMETHASONE SODIUM PHOSPHATE 10 MG/ML IJ SOLN
INTRAMUSCULAR | Status: DC | PRN
  Administered 2019-06-25: 4 mg via INTRAVENOUS

## 2019-06-25 MED ORDER — TRAMADOL HCL 50 MG PO TABS
50.0000 mg | ORAL_TABLET | Freq: Four times a day (QID) | ORAL | Status: DC | PRN
  Administered 2019-06-25 – 2019-06-26 (×2): 50 mg via ORAL
  Filled 2019-06-25 (×2): qty 1

## 2019-06-25 MED ORDER — FENTANYL CITRATE (PF) 100 MCG/2ML IJ SOLN: 25.0000 ug | INTRAMUSCULAR | Status: DC | PRN

## 2019-06-25 MED ORDER — METOPROLOL SUCCINATE ER 25 MG PO TB24
25.0000 mg | ORAL_TABLET | Freq: Every day | ORAL | Status: DC
  Administered 2019-06-25: 25 mg via ORAL
  Filled 2019-06-25: qty 1

## 2019-06-25 MED ORDER — LACTATED RINGERS IV SOLN: INTRAVENOUS | Status: DC

## 2019-06-25 MED ORDER — PROPOFOL 1000 MG/100ML IV EMUL
INTRAVENOUS | Status: AC
  Filled 2019-06-25: qty 100

## 2019-06-25 MED ORDER — ACETAMINOPHEN 325 MG PO TABS: 325.0000 mg | ORAL_TABLET | Freq: Once | ORAL | Status: DC | PRN

## 2019-06-25 MED ORDER — BASAGLAR KWIKPEN 100 UNIT/ML ~~LOC~~ SOPN: 35.0000 [IU] | PEN_INJECTOR | Freq: Every day | SUBCUTANEOUS | Status: DC

## 2019-06-25 MED ORDER — CALCIUM CARBONATE 1250 (500 CA) MG PO TABS
2.0000 | ORAL_TABLET | Freq: Three times a day (TID) | ORAL | Status: DC
  Administered 2019-06-25: 1000 mg via ORAL
  Filled 2019-06-25 (×3): qty 1

## 2019-06-25 MED ORDER — ONDANSETRON HCL 4 MG/2ML IJ SOLN
INTRAMUSCULAR | Status: AC
  Filled 2019-06-25: qty 2

## 2019-06-25 MED ORDER — LIDOCAINE 2% (20 MG/ML) 5 ML SYRINGE
INTRAMUSCULAR | Status: AC
  Filled 2019-06-25: qty 5

## 2019-06-25 MED ORDER — OXYCODONE HCL 5 MG PO TABS: 5.0000 mg | ORAL_TABLET | ORAL | Status: DC | PRN

## 2019-06-25 MED ORDER — RAMIPRIL 5 MG PO CAPS
5.0000 mg | ORAL_CAPSULE | Freq: Every day | ORAL | Status: DC
  Filled 2019-06-25 (×3): qty 1

## 2019-06-25 MED ORDER — ACETAMINOPHEN 10 MG/ML IV SOLN: 1000.0000 mg | Freq: Once | INTRAVENOUS | Status: DC | PRN

## 2019-06-25 MED ORDER — PROPOFOL 10 MG/ML IV BOLUS
INTRAVENOUS | Status: DC | PRN
  Administered 2019-06-25: 170 mg via INTRAVENOUS

## 2019-06-25 MED ORDER — ACETAMINOPHEN 325 MG PO TABS: 650.0000 mg | ORAL_TABLET | Freq: Four times a day (QID) | ORAL | Status: DC | PRN

## 2019-06-25 MED ORDER — PROPOFOL 500 MG/50ML IV EMUL
INTRAVENOUS | Status: AC
  Filled 2019-06-25: qty 50

## 2019-06-25 MED ORDER — CEFAZOLIN SODIUM-DEXTROSE 2-4 GM/100ML-% IV SOLN
2.0000 g | INTRAVENOUS | Status: AC
  Administered 2019-06-25: 2 g via INTRAVENOUS
  Filled 2019-06-25: qty 100

## 2019-06-25 MED ORDER — FENTANYL CITRATE (PF) 100 MCG/2ML IJ SOLN
INTRAMUSCULAR | Status: AC
  Filled 2019-06-25: qty 2

## 2019-06-25 MED ORDER — ROCURONIUM BROMIDE 10 MG/ML (PF) SYRINGE
PREFILLED_SYRINGE | INTRAVENOUS | Status: DC | PRN
  Administered 2019-06-25: 5 mg via INTRAVENOUS
  Administered 2019-06-25: 20 mg via INTRAVENOUS
  Administered 2019-06-25: 60 mg via INTRAVENOUS

## 2019-06-25 MED ORDER — METFORMIN HCL 500 MG PO TABS
1000.0000 mg | ORAL_TABLET | Freq: Two times a day (BID) | ORAL | Status: DC
  Administered 2019-06-25 – 2019-06-26 (×2): 1000 mg via ORAL
  Filled 2019-06-25 (×2): qty 2

## 2019-06-25 MED ORDER — MIDAZOLAM HCL 2 MG/2ML IJ SOLN
INTRAMUSCULAR | Status: DC | PRN
  Administered 2019-06-25 (×2): 1 mg via INTRAVENOUS

## 2019-06-25 MED ORDER — ONDANSETRON 4 MG PO TBDP: 4.0000 mg | ORAL_TABLET | Freq: Four times a day (QID) | ORAL | Status: DC | PRN

## 2019-06-25 MED ORDER — LABETALOL HCL 5 MG/ML IV SOLN
INTRAVENOUS | Status: AC
  Filled 2019-06-25: qty 4

## 2019-06-25 MED ORDER — INSULIN ASPART 100 UNIT/ML ~~LOC~~ SOLN
0.0000 [IU] | Freq: Three times a day (TID) | SUBCUTANEOUS | Status: DC
  Administered 2019-06-25: 11 [IU] via SUBCUTANEOUS
  Administered 2019-06-26: 8 [IU] via SUBCUTANEOUS

## 2019-06-25 MED ORDER — SUGAMMADEX SODIUM 200 MG/2ML IV SOLN
INTRAVENOUS | Status: DC | PRN
  Administered 2019-06-25: 170 mg via INTRAVENOUS

## 2019-06-25 MED ORDER — INSULIN GLARGINE 100 UNIT/ML ~~LOC~~ SOLN
35.0000 [IU] | Freq: Every day | SUBCUTANEOUS | Status: DC
  Administered 2019-06-25: 35 [IU] via SUBCUTANEOUS
  Filled 2019-06-25: qty 0.35

## 2019-06-25 MED ORDER — POTASSIUM CHLORIDE IN NACL 20-0.9 MEQ/L-% IV SOLN
INTRAVENOUS | Status: DC
  Filled 2019-06-25: qty 1000

## 2019-06-25 MED ORDER — MEPERIDINE HCL 50 MG/ML IJ SOLN: 6.2500 mg | INTRAMUSCULAR | Status: DC | PRN

## 2019-06-25 MED ORDER — ACETAMINOPHEN 650 MG RE SUPP: 650.0000 mg | Freq: Four times a day (QID) | RECTAL | Status: DC | PRN

## 2019-06-25 MED ORDER — FENTANYL CITRATE (PF) 250 MCG/5ML IJ SOLN
INTRAMUSCULAR | Status: DC | PRN
  Administered 2019-06-25: 100 ug via INTRAVENOUS
  Administered 2019-06-25: 50 ug via INTRAVENOUS

## 2019-06-25 SURGICAL SUPPLY — 30 items
ADH SKN CLS APL DERMABOND .7 (GAUZE/BANDAGES/DRESSINGS) ×1
APL PRP STRL LF DISP 70% ISPRP (MISCELLANEOUS) ×1
ATTRACTOMAT 16X20 MAGNETIC DRP (DRAPES) ×2 IMPLANT
BLADE SURG 15 STRL LF DISP TIS (BLADE) ×1 IMPLANT
BLADE SURG 15 STRL SS (BLADE) ×2
CHLORAPREP W/TINT 26 (MISCELLANEOUS) ×2 IMPLANT
CLIP VESOCCLUDE MED 6/CT (CLIP) ×4 IMPLANT
CLIP VESOCCLUDE SM WIDE 6/CT (CLIP) ×6 IMPLANT
COVER SURGICAL LIGHT HANDLE (MISCELLANEOUS) ×2 IMPLANT
COVER WAND RF STERILE (DRAPES) ×1 IMPLANT
DERMABOND ADVANCED (GAUZE/BANDAGES/DRESSINGS) ×1
DERMABOND ADVANCED .7 DNX12 (GAUZE/BANDAGES/DRESSINGS) ×1 IMPLANT
DRAPE LAPAROTOMY T 98X78 PEDS (DRAPES) ×2 IMPLANT
ELECT REM PT RETURN 15FT ADLT (MISCELLANEOUS) ×2 IMPLANT
GAUZE 4X4 16PLY RFD (DISPOSABLE) ×2 IMPLANT
GLOVE SURG ORTHO 8.0 STRL STRW (GLOVE) ×2 IMPLANT
GOWN STRL REUS W/TWL XL LVL3 (GOWN DISPOSABLE) ×4 IMPLANT
HEMOSTAT SURGICEL 2X4 FIBR (HEMOSTASIS) ×2 IMPLANT
ILLUMINATOR WAVEGUIDE N/F (MISCELLANEOUS) ×2 IMPLANT
KIT BASIN OR (CUSTOM PROCEDURE TRAY) ×2 IMPLANT
KIT TURNOVER KIT A (KITS) ×1 IMPLANT
PACK BASIC VI WITH GOWN DISP (CUSTOM PROCEDURE TRAY) ×2 IMPLANT
PENCIL SMOKE EVACUATOR (MISCELLANEOUS) IMPLANT
SHEARS HARMONIC 9CM CVD (BLADE) ×2 IMPLANT
SUT MNCRL AB 4-0 PS2 18 (SUTURE) ×2 IMPLANT
SUT VIC AB 3-0 SH 18 (SUTURE) ×4 IMPLANT
SYR BULB IRRIGATION 50ML (SYRINGE) ×2 IMPLANT
TOWEL OR 17X26 10 PK STRL BLUE (TOWEL DISPOSABLE) ×2 IMPLANT
TOWEL OR NON WOVEN STRL DISP B (DISPOSABLE) ×2 IMPLANT
TUBING CONNECTING 10 (TUBING) ×2 IMPLANT

## 2019-06-25 NOTE — Interval H&P Note (Signed)
History and Physical Interval Note:  06/25/2019 9:14 AM  [name]  has presented today for surgery, with the diagnosis of THYROID NEOPLASM OF UNCERTAIN BEHAVIOR.  The various methods of treatment have been discussed with the patient and family. After consideration of risks, benefits and other options for treatment, the patient has consented to    Procedure(s): TOTAL THYROIDECTOMY (N/A) as a surgical intervention.    The patient's history has been reviewed, patient examined, no change in status, stable for surgery.  I have reviewed the patient's chart and labs.  Questions were answered to the patient's satisfaction.    Armandina Gemma, MD [facility] Office: [place]

## 2019-06-25 NOTE — Transfer of Care (Signed)
Immediate Anesthesia Transfer of Care Note  Patient: [name]  Procedure(s) Performed: TOTAL THYROIDECTOMY (N/A )  Patient Location: PACU  Anesthesia Type:General  Level of Consciousness: awake, alert  and oriented  Airway & Oxygen Therapy: Patient Spontanous Breathing and Patient connected to face mask oxygen  Post-op Assessment: Report given to RN and Post -op Vital signs reviewed and stable  Post vital signs: Reviewed and stable  Last Vitals:  Vitals Value Taken Time  BP    Temp    Pulse    Resp    SpO2      Last Pain:  Vitals:   06/25/19 0727  TempSrc:   PainSc: 0-No pain         Complications: No apparent anesthesia complications

## 2019-06-25 NOTE — Anesthesia Procedure Notes (Signed)
Procedure Name: Intubation Date/Time: 06/25/2019 9:32 AM Performed by: Eben Burow, CRNA Pre-anesthesia Checklist: Patient identified, Emergency Drugs available, Suction available, Patient being monitored and Timeout performed Patient Re-evaluated:Patient Re-evaluated prior to induction Oxygen Delivery Method: Circle system utilized Preoxygenation: Pre-oxygenation with 100% oxygen Induction Type: IV induction Ventilation: Mask ventilation without difficulty Laryngoscope Size: Mac and 4 Grade View: Grade I Tube type: Oral Tube size: 7.0 mm Number of attempts: 1 Airway Equipment and Method: Stylet Placement Confirmation: ETT inserted through vocal cords under direct vision,  positive ETCO2 and breath sounds checked- equal and bilateral Secured at: 22 cm Tube secured with: Tape Dental Injury: Teeth and Oropharynx as per pre-operative assessment

## 2019-06-25 NOTE — Op Note (Signed)
Procedure Note  Pre-operative Diagnosis:  Thyroid neoplasm of uncertain behavior, multiple thyroid nodules  Post-operative Diagnosis:  same  Surgeon:  Armandina Gemma, MD  Assistant:  Carlena Hurl, PA-C   Procedure:  Total thyroidectomy  Anesthesia:  General  Estimated Blood Loss:  minimal  Drains: none         Specimen: thyroid to pathology  Indications:  Patient is referred by Dr. Domenick Gong for surgical evaluation and management of a thyroid neoplasm of uncertain behavior. Patient had been undergoing a cardiac evaluation early in 2020. As part of her evaluation she underwent a CT scan of the chest and had an incidental finding of thyroid nodules. Patient subsequently underwent a thyroid ultrasound on March 05, 2019. This identified a 3.1 cm nodule in the inferior right thyroid lobe as well as a 1.2 cm nodule in the mid left thyroid lobe. The patient and I reviewed this study together today. Patient underwent biopsy of the dominant nodule on the right side on April 03, 2019. This showed cytologic atypia, Bethesda category III. specimen was sent for molecular genetic testing and returned with a result of suspicious, rendering a risk of malignancy of 50%. TSH level was normal at 1.79. Patient has no prior history of thyroid disease. She has never been on thyroid medication. She has had no prior head or neck surgery. There is a family history of thyroid disease and the patient's mother who possibly had thyroid cancer. There is also a history of thyroid disease and the patient's daughter who required treatment with radioactive iodine. Patient also has 2 sisters who have been diagnosed with breast cancer. Patient denies any compressive symptoms. She presents today for thyroid surgery.  Procedure Details: Procedure was done in OR #4 at the [facility]. The patient was brought to the operating room and placed in a supine position on the operating room table. Following  administration of general anesthesia, the patient was positioned and then prepped and draped in the usual aseptic fashion. After ascertaining that an adequate level of anesthesia had been achieved, a small Kocher incision was made with #15 blade. Dissection was carried through subcutaneous tissues and platysma.Hemostasis was achieved with the electrocautery. Skin flaps were elevated cephalad and caudad from the thyroid notch to the sternal notch. A Mahorner self-retaining retractor was placed for exposure. Strap muscles were incised in the midline and dissection was begun on the left side.  Strap muscles were reflected laterally.  Left thyroid lobe was normal sized with a central approx 1 cm firm nodule.  The left lobe was gently mobilized with blunt dissection. Superior pole vessels were dissected out and divided individually between small and medium ligaclips with the harmonic scalpel. The thyroid lobe was rolled anteriorly. Branches of the inferior thyroid artery were divided between small ligaclips with the harmonic scalpel. Inferior venous tributaries were divided between ligaclips. Both the superior and inferior parathyroid glands were identified and preserved on their vascular pedicles. The recurrent laryngeal nerve was identified and preserved along its course. The ligament of Gwenlyn Found was released with the electrocautery and the gland was mobilized onto the anterior trachea. Isthmus was mobilized across the midline. There was a moderate sized pyramidal lobe present which was resected with the isthmus. Dry pack was placed in the left neck.  The right thyroid lobe was gently mobilized with blunt dissection. Right thyroid lobe was mildly enlarged with a dominant nodule in the inferior pole, approx 3 cm. Superior pole vessels were dissected out and divided between  small and medium ligaclips with the Harmonic scalpel. Superior parathyroid was identified and preserved. Inferior venous tributaries were divided  between medium ligaclips with the harmonic scalpel. The right thyroid lobe was rolled anteriorly and the branches of the inferior thyroid artery divided between small ligaclips. The right recurrent laryngeal nerve was identified and preserved along its course. The ligament of Gwenlyn Found was released with the electrocautery. The right thyroid lobe was mobilized onto the anterior trachea and the remainder of the thyroid was dissected off the anterior trachea and the thyroid was completely excised. A suture was used to mark the right lobe. The entire thyroid gland was submitted to pathology for review.  The neck was irrigated with warm saline. Fibrillar was placed throughout the operative field. Strap muscles were approximated in the midline with interrupted 3-0 Vicryl sutures. Platysma was closed with interrupted 3-0 Vicryl sutures. Skin was closed with a running 4-0 Monocryl subcuticular suture. Wound was washed and Dermabond was applied. The patient was awakened from anesthesia and brought to the recovery room. The patient tolerated the procedure well.   Armandina Gemma, MD [facility] Office: [phone]

## 2019-06-26 ENCOUNTER — Encounter: Payer: Self-pay | Admitting: *Deleted

## 2019-06-26 DIAGNOSIS — E042 Nontoxic multinodular goiter: Secondary | ICD-10-CM | POA: Diagnosis not present

## 2019-06-26 LAB — BASIC METABOLIC PANEL
Anion gap: 12 (ref 5–15)
BUN: 12 mg/dL (ref 8–23)
CO2: 24 mmol/L (ref 22–32)
Calcium: 9.1 mg/dL (ref 8.9–10.3)
Chloride: 97 mmol/L — ABNORMAL LOW (ref 98–111)
Creatinine, Ser: 0.54 mg/dL (ref 0.44–1.00)
GFR calc Af Amer: >60 mL/min (ref >60)
GFR calc non Af Amer: >60 mL/min (ref >60)
Glucose, Bld: 285 mg/dL — ABNORMAL HIGH (ref 70–99)
Potassium: 3.9 mmol/L (ref 3.5–5.1)
Sodium: 133 mmol/L — ABNORMAL LOW (ref 135–145)

## 2019-06-26 LAB — GLUCOSE, CAPILLARY: Glucose-Capillary: 282 mg/dL — ABNORMAL HIGH (ref 70–99)

## 2019-06-26 LAB — HEMOGLOBIN A1C
Hgb A1c MFr Bld: 7.1 % — ABNORMAL HIGH (ref 4.8–5.6)
Mean Plasma Glucose: 157.07 mg/dL

## 2019-06-26 MED ORDER — LEVOTHYROXINE SODIUM 88 MCG PO TABS: 88.0000 ug | ORAL_TABLET | Freq: Every day | ORAL | 3 refills | Status: DC

## 2019-06-26 MED ORDER — CALCIUM CARBONATE ANTACID 500 MG PO CHEW: 2.0000 | CHEWABLE_TABLET | Freq: Two times a day (BID) | ORAL | 1 refills | Status: DC

## 2019-06-26 MED ORDER — TRAMADOL HCL 50 MG PO TABS: 50.0000 mg | ORAL_TABLET | Freq: Four times a day (QID) | ORAL | 0 refills | Status: DC | PRN

## 2019-06-26 NOTE — Discharge Summary (Signed)
Physician Discharge Summary [facility]  Patient ID: [name] MRN: [id number] DOB/AGE: 1955/05/25 64 y.o.  Admit date: 06/25/2019 Discharge date: 06/26/2019  Admission Diagnoses:  Thyroid neoplasm of uncertain behavior  Discharge Diagnoses:  Principal Problem:   Neoplasm of uncertain behavior of thyroid gland Active Problems:   Multiple thyroid nodules   Discharged Condition: good  Hospital Course: Patient was admitted for observation following thyroid surgery.  Post op course was uncomplicated.  Pain was well controlled.  Tolerated diet.  Post op calcium level on morning following surgery was 9.1 mg/dl.  Patient was prepared for discharge home on POD#1.  Consults: None  Treatments: surgery: total thyroidectomy  Discharge Exam: Blood pressure 132/80, pulse 80, temperature 98.9 F (37.2 C), temperature source Oral, resp. rate 16, height 5\' 6"  (1.676 m), weight 81.8 kg, SpO2 96 %. HEENT - clear Neck - wound dry and intact; Dermabond in place; mild ecchymosis at right extent; voice with slight hoarseness Chest - clear bilaterally Cor - RRR  Disposition: Home  Discharge Instructions    Diet - low sodium heart healthy   Complete by: As directed    Discharge instructions   Complete by: As directed    [facility]  THYROID & PARATHYROID SURGERY:  POST-OP INSTRUCTIONS  Always review your discharge instruction sheet from the facility where your surgery was performed.  A prescription for pain medication may be given to you upon discharge.  Take your pain medication as prescribed.  If narcotic pain medicine is not needed, then you may take acetaminophen (Tylenol) or ibuprofen (Advil) as needed.  Take your usually prescribed medications unless otherwise directed.  If you need a refill on your pain medication, please contact our office during regular business hours.  Prescriptions cannot be processed by our office after 5 pm or on  weekends.  Start with a light diet upon arrival home, such as soup and crackers or toast.  Be sure to drink plenty of fluids daily.  Resume your normal diet the day after surgery.  Most patients will experience some swelling and bruising on the chest and neck area.  Ice packs will help.  Swelling and bruising can take several days to resolve.   It is common to experience some constipation after surgery.  Increasing fluid intake and taking a stool softener (Colace) will usually help or prevent this problem.  A mild laxative (Milk of Magnesia or Miralax) should be taken according to package directions if there has been no bowel movement after 48 hours.  You have steri-strips and a gauze dressing over your incision.  You may remove the gauze bandage on the second day after surgery, and you may shower at that time.  Leave your steri-strips (small skin tapes) in place directly over the incision.  These strips should remain on the skin for 5-7 days and then be removed.  You may get them wet in the shower and pat them dry.  You may resume regular (light) daily activities beginning the next day (such as daily self-care, walking, climbing stairs) gradually increasing activities as tolerated.  You may have sexual intercourse when it is comfortable.  Refrain from any heavy lifting or straining until approved by your doctor.  You may drive when you no longer are taking prescription pain medication, you can comfortably wear a seatbelt, and you can safely maneuver your car and apply brakes.  You should see your doctor in the office for a follow-up appointment approximately  three weeks after your surgery.  Make sure that you call for this appointment within a day or two after you arrive home to insure a convenient appointment time.  WHEN TO CALL YOUR DOCTOR: -- Fever greater than 101.5 -- Inability to urinate -- Nausea and/or vomiting - persistent -- Extreme swelling or bruising -- Continued bleeding from  incision -- Increased pain, redness, or drainage from the incision -- Difficulty swallowing or breathing -- Muscle cramping or spasms -- Numbness or tingling in hands or around lips  The clinic staff is available to answer your questions during regular business hours.  Please don't hesitate to call and ask to speak to one of the nurses if you have concerns.  Armandina Gemma, MD [facility] Office: [phone]   Increase activity slowly   Complete by: As directed    No dressing needed   Complete by: As directed      Allergies as of 06/26/2019   No Known Allergies     Medication List    TAKE these medications   alendronate 70 MG tablet Commonly known as: FOSAMAX Take 70 mg by mouth every Sunday. Take with a full glass of water on an empty stomach.   aspirin EC 81 MG tablet Take 81 mg by mouth daily.   Basaglar KwikPen 100 UNIT/ML Inject 35 Units into the skin at bedtime.   CALCIUM 600 + D PO Take 1 tablet by mouth daily.   Calcium 600 600 MG Tabs tablet Generic drug: calcium carbonate Take 600 mg by mouth daily.   calcium carbonate 500 MG chewable tablet Commonly known as: Tums Chew 2 tablets (400 mg of elemental calcium total) by mouth 2 (two) times daily.   ciprofloxacin 250 MG tablet Commonly known as: CIPRO Take 250 mg by mouth See admin instructions. Take 1 tablet (250 mg) by mouth daily as needed after sexual intercourse.   insulin lispro 100 UNIT/ML injection Commonly known as: HUMALOG Inject 0-15 Units into the skin 3 (three) times daily before meals. Sliding scale   JUICE PLUS FIBRE PO Take 6 tablets by mouth in the morning and at bedtime. Fruit Blend (2) + Vegetable Blend (2) + Berry Blend twice daily   levothyroxine 88 MCG tablet Commonly known as: Synthroid Take 1 tablet (88 mcg total) by mouth daily before breakfast.   metFORMIN 1000 MG tablet Commonly known as: GLUCOPHAGE Take 1,000 mg by mouth 2 (two) times daily with a meal.    metoprolol succinate 25 MG 24 hr tablet Commonly known as: TOPROL-XL Take 1 tablet (25 mg total) by mouth daily. What changed: when to take this   ramipril 5 MG capsule Commonly known as: ALTACE Take 5 mg by mouth daily.   rosuvastatin 20 MG tablet Commonly known as: CRESTOR Take 20 mg by mouth daily with supper.   traMADol 50 MG tablet Commonly known as: ULTRAM Take 1-2 tablets (50-100 mg total) by mouth every 6 (six) hours as needed.   vitamin C 250 MG tablet Commonly known as: ASCORBIC ACID Take 500 mg by mouth daily with lunch.        Earnstine Regal, MD, [facility] Office: [phone]   Signed: Armandina Gemma 06/26/2019, 7:46 AM

## 2019-06-26 NOTE — Anesthesia Postprocedure Evaluation (Signed)
Anesthesia Post Note  Patient: [name]  Procedure(s) Performed: TOTAL THYROIDECTOMY (N/A )     Patient location during evaluation: PACU Anesthesia Type: General Level of consciousness: awake and alert Pain management: pain level controlled Vital Signs Assessment: post-procedure vital signs reviewed and stable Respiratory status: spontaneous breathing, nonlabored ventilation, respiratory function stable and patient connected to nasal cannula oxygen Cardiovascular status: blood pressure returned to baseline and stable Postop Assessment: no apparent nausea or vomiting Anesthetic complications: no    Last Vitals:  Vitals:   06/26/19 0227 06/26/19 0443  BP: (!) 143/86 132/80  Pulse: 83 80  Resp: 17 16  Temp: 37.2 C 37.2 C  SpO2: 96% 96%    Last Pain:  Vitals:   06/26/19 0808  TempSrc:   PainSc: 0-No pain                 Effie Berkshire

## 2019-06-26 NOTE — Progress Notes (Signed)
Patient was discharged home, discharge instructions were reviewed with patient and all questions were answered. Patient taken to main entrance via wheelchair.

## 2019-06-27 LAB — SURGICAL PATHOLOGY

## 2019-06-27 NOTE — Progress Notes (Signed)
Please contact patient and notify of benign pathology results.  Cayce Paschal M. Ashyia Schraeder, MD, FACS [facility] Office: [phone]   

## 2019-06-29 LAB — GLUCOSE, CAPILLARY: Glucose-Capillary: 212 mg/dL — ABNORMAL HIGH (ref 70–99)

## 2019-09-27 ENCOUNTER — Encounter: Payer: Self-pay | Admitting: Cardiovascular Disease

## 2019-09-27 ENCOUNTER — Ambulatory Visit: Payer: Medicare Other | Admitting: Cardiovascular Disease

## 2019-09-27 ENCOUNTER — Other Ambulatory Visit: Payer: Self-pay

## 2019-09-27 VITALS — BP 144/70 | HR 80 | Ht 66.0 in | Wt 182.0 lb

## 2019-09-27 DIAGNOSIS — E785 Hyperlipidemia, unspecified: Secondary | ICD-10-CM

## 2019-09-27 DIAGNOSIS — I493 Ventricular premature depolarization: Secondary | ICD-10-CM | POA: Diagnosis not present

## 2019-09-27 DIAGNOSIS — I2510 Atherosclerotic heart disease of native coronary artery without angina pectoris: Secondary | ICD-10-CM | POA: Diagnosis not present

## 2019-09-27 MED ORDER — METOPROLOL SUCCINATE ER 25 MG PO TB24: 25.0000 mg | ORAL_TABLET | Freq: Every day | ORAL | 3 refills | Status: DC

## 2019-09-27 NOTE — Progress Notes (Signed)
Cardiology Office Note   Date:  09/27/2019   ID:  [name], DOB 12/23/1955, MRN [id number]  PCP:  Haywood Pao, MD  Cardiologist:   Kathlyn Sacramento, MD   Chief Complaint  Patient presents with  . Other    12 month follow up. Meds reviewed verbally with patient.       History of Present Illness: [name] is a 64 y.o. female who presents for a follow-up visit regarding coronary atherosclerosis and PVCs. She has past medical history of type 2 diabetes, hyperlipidemia and anemia. She was hospitalized in March 2020 with atypical chest pain.  Echo showed normal LV systolic function and wall motion.  CT chest was negative for pulmonary embolism but did show coronary and aortic calcifications as well as small pulmonary nodules.  Stress test was negative for ischemia.  Pulmonary nodules have been stable on repeat CT imaging.  She was found to have thyroid nodules and underwent thyroidectomy in March of this year.  Pathology was benign.  She is now on thyroid replacement therapy.  She has been doing well with no recent chest pain, shortness of breath or palpitations.  She takes her medications regularly.  She is noted to have PVCs today on EKG but does not seem to be symptomatic.   Past Medical History:  Diagnosis Date  . Anemia   . Arthritis   . Atypical chest pain    a. 05/2018 CTA chest: No PE; b. 05/2018 MV: No ischemia/scar. EF 65%.  . Coronary artery calcification seen on CT scan    a. 05/2018 CT Chest: Coronary and Ao atherosclerotic Calcifications.  . Diabetes mellitus without complication ([place])   . Diastolic dysfunction    a. 05/2018 Echo: EF 60-65%, impaired relaxation. Nl RVSP.  Marland Kitchen Dysrhythmia    PVC's  . Family history of adverse reaction to anesthesia    sister-malignant hyperthermia  . FH of Malignant hyperthermia    a. sister had malginant hyperthermia  . History of renal insufficiency   . History of skin cancer   . Hyperlipemia   . Osteoporosis   .  Pulmonary nodules    a. 05/2018 CT Chest: Small pulm nodules measuring up to 70mm avg diameter. Rec non-contrast Chest CT in 6-12 mos.  . Thyroid nodule     Past Surgical History:  Procedure Laterality Date  . CESAREAN SECTION    . CHOLECYSTECTOMY  2006   lap choli with umb hernia  . COLONOSCOPY    . LIPOMA EXCISION Left 03/15/2014   Procedure: EXCISION LIPOMA LEFT LOWER QUARDRANT ABDOMINAL WALL;  Surgeon: Georganna Skeans, MD;  Location: [facility];  Service: General;  Laterality: Left;  . THYROIDECTOMY N/A 06/25/2019   Procedure: TOTAL THYROIDECTOMY;  Surgeon: Armandina Gemma, MD;  Location: [facility];  Service: General;  Laterality: N/A;  . TONSILLECTOMY    . TRIGGER FINGER RELEASE  2012   lt small,middle,long  . TRIGGER FINGER RELEASE Right 07/14/2017   Procedure: RELEASE TRIGGER FINGER/A-1 PULLEY RIGHT THUMB;  Surgeon: Daryll Brod, MD;  Location: [facility];  Service: Orthopedics;  Laterality: Right;  . UMBILICAL HERNIA REPAIR  2006   with lap choli     Current Outpatient Medications  Medication Sig Dispense Refill  . alendronate (FOSAMAX) 70 MG tablet Take 70 mg by mouth every Sunday. Take with a full glass of water on an empty stomach.     Marland Kitchen aspirin EC 81 MG tablet Take 81 mg by mouth  daily.    . Calcium Carb-Cholecalciferol (CALCIUM 600 + D PO) Take 1 tablet by mouth daily.    . ciprofloxacin (CIPRO) 250 MG tablet Take 250 mg by mouth See admin instructions. Take 1 tablet (250 mg) by mouth daily as needed after sexual intercourse.    . Insulin Glargine (BASAGLAR KWIKPEN) 100 UNIT/ML SOPN Inject 35 Units into the skin at bedtime.    . insulin lispro (HUMALOG) 100 UNIT/ML injection Inject 0-15 Units into the skin 3 (three) times daily before meals. Sliding scale    . levothyroxine (SYNTHROID) 100 MCG tablet Take 100 mcg by mouth daily before breakfast.    . metFORMIN (GLUCOPHAGE) 1000 MG tablet Take 1,000 mg by mouth 2 (two) times daily with a meal.    .  metoprolol succinate (TOPROL-XL) 25 MG 24 hr tablet Take 1 tablet (25 mg total) by mouth daily. (Patient taking differently: Take 25 mg by mouth daily with supper. ) 30 tablet 11  . Nutritional Supplements (JUICE PLUS FIBRE PO) Take 6 tablets by mouth in the morning and at bedtime. Fruit Blend (2) + Vegetable Blend (2) + Berry Blend twice daily    . ramipril (ALTACE) 5 MG capsule Take 5 mg by mouth daily.     . rosuvastatin (CRESTOR) 20 MG tablet Take 20 mg by mouth daily with supper.      No current facility-administered medications for this visit.    Allergies:   Patient has no known allergies.    Social History:  The patient  reports that she has never smoked. She has never used smokeless tobacco. She reports that she does not drink alcohol and does not use drugs.   Family History:  The patient's family history includes Breast cancer in her sister and sister; Diabetes in her father; Heart disease in her mother and another family member.    ROS:  Please see the history of present illness.   Otherwise, review of systems are positive for none.   All other systems are reviewed and negative.    PHYSICAL EXAM: VS:  BP (!) 144/70 (BP Location: Left Arm, Patient Position: Sitting, Cuff Size: Normal)   Pulse 80   Ht 5\' 6"  (1.676 m)   Wt 182 lb (82.6 kg)   SpO2 98%   BMI 29.38 kg/m  , BMI Body mass index is 29.38 kg/m. GEN: Well nourished, well developed, in no acute distress  HEENT: normal  Neck: no JVD, carotid bruits, or masses Cardiac: RRR; no murmurs, rubs, or gallops,no edema  Respiratory:  clear to auscultation bilaterally, normal work of breathing GI: soft, nontender, nondistended, + BS MS: no deformity or atrophy  Skin: warm and dry, no rash Neuro:  Strength and sensation are intact Psych: euthymic mood, full affect   EKG:  EKG is ordered today. The ekg ordered today demonstrates sinus rhythm with PVCs.  No significant ST changes.   Recent Labs: 06/19/2019: Hemoglobin  14.2; Platelets 370 06/26/2019: BUN 12; Creatinine, Ser 0.54; Potassium 3.9; Sodium 133    Lipid Panel    Component Value Date/Time   CHOL 93 06/15/2018 0912   TRIG 59 06/15/2018 0912   HDL 34 (L) 06/15/2018 0912   CHOLHDL 2.7 06/15/2018 0912   VLDL 12 06/15/2018 0912   LDLCALC 47 06/15/2018 0912      Wt Readings from Last 3 Encounters:  09/27/19 182 lb (82.6 kg)  06/25/19 180 lb 6.4 oz (81.8 kg)  06/19/19 180 lb 6.4 oz (81.8 kg)  No flowsheet data found.    ASSESSMENT AND PLAN:  1.  Coronary atherosclerosis: She is doing well with no anginal symptoms.  Continue medical therapy.  2.  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  Continue current dose of Toprol.  3.  Essential hypertension: Blood pressure is mildly elevated but that is unusual for her.  She is currently on Toprol and ramipril.  I made no changes.  4.  Hyperlipidemia: She is tolerating rosuvastatin with no reported side effects.  She gets her labs done with her primary care physician.  Lipid profile in March of last year showed an LDL of 47.  5.  Pulmonary nodules: Stable on most recent CT imaging   Disposition:   FU with me in 1 year  Signed,  Kathlyn Sacramento, MD  09/27/2019 10:20 AM    [place]

## 2019-09-27 NOTE — Patient Instructions (Signed)
Medication Instructions:  Your physician recommends that you continue on your current medications as directed. Please refer to the Current Medication list given to you today.  Your Metoprolol has been refilled.  *If you need a refill on your cardiac medications before your next appointment, please call your pharmacy*   Lab Work: None ordered If you have labs (blood work) drawn today and your tests are completely normal, you will receive your results only by:  [place] (if you have MyChart) OR  A paper copy in the mail If you have any lab test that is abnormal or we need to change your treatment, we will call you to review the results.   Testing/Procedures: None ordererd   Follow-Up: At [facility], you and your health needs are our priority.  As part of our continuing mission to provide you with exceptional heart care, we have created designated Provider Care Teams.  These Care Teams include your primary Cardiologist (physician) and Advanced Practice Providers (APPs -  Physician Assistants and Nurse Practitioners) who all work together to provide you with the care you need, when you need it.  We recommend signing up for the patient portal called "MyChart".  Sign up information is provided on this After Visit Summary.  MyChart is used to connect with patients for Virtual Visits (Telemedicine).  Patients are able to view lab/test results, encounter notes, upcoming appointments, etc.  Non-urgent messages can be sent to your provider as well.   To learn more about what you can do with MyChart, go to [email or web address].    Your next appointment:   12 month(s)  The format for your next appointment:   In Person  Provider:    You may see Kathlyn Sacramento, MD or one of the following Advanced Practice Providers on your designated Care Team:    Murray Hodgkins, NP  Christell Faith, PA-C  Marrianne Mood, PA-C    Other Instructions N/A

## 2019-10-08 ENCOUNTER — Other Ambulatory Visit: Payer: Self-pay

## 2019-10-08 DIAGNOSIS — I493 Ventricular premature depolarization: Secondary | ICD-10-CM

## 2019-10-08 MED ORDER — METOPROLOL SUCCINATE ER 25 MG PO TB24: 25.0000 mg | ORAL_TABLET | Freq: Every day | ORAL | 3 refills | Status: DC

## 2020-09-30 ENCOUNTER — Ambulatory Visit: Payer: Medicare Other | Admitting: Cardiovascular Disease

## 2020-10-09 ENCOUNTER — Ambulatory Visit: Payer: Medicare Other | Admitting: Cardiovascular Disease

## 2020-10-09 ENCOUNTER — Other Ambulatory Visit: Payer: Self-pay

## 2020-10-09 ENCOUNTER — Encounter: Payer: Self-pay | Admitting: Cardiovascular Disease

## 2020-10-09 VITALS — BP 120/80 | HR 83 | Ht 66.0 in | Wt 181.6 lb

## 2020-10-09 DIAGNOSIS — I10 Essential (primary) hypertension: Secondary | ICD-10-CM | POA: Diagnosis not present

## 2020-10-09 DIAGNOSIS — E782 Mixed hyperlipidemia: Secondary | ICD-10-CM

## 2020-10-09 DIAGNOSIS — E118 Type 2 diabetes mellitus with unspecified complications: Secondary | ICD-10-CM | POA: Diagnosis not present

## 2020-10-09 DIAGNOSIS — I25118 Atherosclerotic heart disease of native coronary artery with other forms of angina pectoris: Secondary | ICD-10-CM | POA: Diagnosis not present

## 2020-10-09 NOTE — Progress Notes (Signed)
Cardiology Office Note  Date:  10/09/2020   ID:  [name], DOB 1955/05/10, MRN [id number]  PCP:  Haywood Pao, MD   Chief Complaint  Patient presents with   Other    12 month f/u no complaints today. Meds reviewed verbally     HPI:  [name] is a 65 y.o. female who presents for a follow-up visit regarding coronary atherosclerosis  type 2 diabetes,  hyperlipidemia  Anemia Asymptomatic PVCs Who presents for follow-up of her coronary artery disease  In follow-up today she reports that she feels well, denies any angina Active with her family, large extended family doing activities such [place], [place] Spends time outside, no regular exercise program  CT scan images pulled up from 2020 Showing significant coronary calcification in the LAD Mild aortic atherosclerosis  Denies any recent hospitalizations Compliant with her medications, reports blood pressure typically well controlled No side effects from her Crestor  EKG personally reviewed by myself on todays visit Normal sinus rhythm with no significant ST-T wave changes  Other past medical history reviewed hospitalized in March 2020 with atypical chest pain.   Echo showed normal LV systolic function and wall motion.   CT chest was negative for pulmonary embolism but did show coronary and aortic calcifications as well as small pulmonary nodules.   Stress test was negative for ischemia.   History of hyroid nodules and underwent thyroidectomy  Pathology was benign.  on thyroid replacement therapy.    A1C 6.7 to 7.1 to 7.3 Low B12  Total chol 93 Nonsmoker  Stress test 05/2018: Low risk study   PMH:   has a past medical history of Anemia, Arthritis, Atypical chest pain, Coronary artery calcification seen on CT scan, Diabetes mellitus without complication ([place]), Diastolic dysfunction, Dysrhythmia, Family history of adverse reaction to anesthesia, FH of Malignant hyperthermia, History of  renal insufficiency, History of skin cancer, Hyperlipemia, Osteoporosis, Pulmonary nodules, and Thyroid nodule.  PSH:    Past Surgical History:  Procedure Laterality Date   CESAREAN SECTION     CHOLECYSTECTOMY  2006   lap choli with umb hernia   COLONOSCOPY     LIPOMA EXCISION Left 03/15/2014   Procedure: EXCISION LIPOMA LEFT LOWER [place] ABDOMINAL WALL;  Surgeon: Georganna Skeans, MD;  Location: [place];  Service: General;  Laterality: Left;   THYROIDECTOMY N/A 06/25/2019   Procedure: TOTAL THYROIDECTOMY;  Surgeon: Armandina Gemma, MD;  Location: [facility];  Service: General;  Laterality: N/A;   TONSILLECTOMY     TRIGGER FINGER RELEASE  2012   lt small,middle,long   TRIGGER FINGER RELEASE Right 07/14/2017   Procedure: RELEASE TRIGGER FINGER/A-1 PULLEY RIGHT THUMB;  Surgeon: Daryll Brod, MD;  Location: [place];  Service: Orthopedics;  Laterality: Right;   UMBILICAL HERNIA REPAIR  2006   with lap choli    Current Outpatient Medications  Medication Sig Dispense Refill   alendronate (FOSAMAX) 70 MG tablet Take 70 mg by mouth every Sunday. Take with a full glass of water on an empty stomach.      aspirin EC 81 MG tablet Take 81 mg by mouth daily.     Calcium Carb-Cholecalciferol (CALCIUM 600 + D PO) Take 1 tablet by mouth daily.     ciprofloxacin (CIPRO) 250 MG tablet Take 250 mg by mouth See admin instructions. Take 1 tablet (250 mg) by mouth daily as needed after sexual intercourse.     Cyanocobalamin (VITAMIN B-12 PO) Take  by mouth. Takes every 31 days.(Inj.)     Insulin Glargine (BASAGLAR KWIKPEN) 100 UNIT/ML SOPN Inject 35 Units into the skin at bedtime.     insulin lispro (HUMALOG) 100 UNIT/ML injection Inject 0-15 Units into the skin 3 (three) times daily before meals. Sliding scale     levothyroxine (SYNTHROID) 112 MCG tablet Take 112 mcg by mouth daily before breakfast.     metFORMIN (GLUCOPHAGE) 1000 MG tablet Take 1,000 mg by mouth 2 (two) times  daily with a meal.     metoprolol succinate (TOPROL-XL) 25 MG 24 hr tablet Take 1 tablet (25 mg total) by mouth daily. 90 tablet 3   Nutritional Supplements (JUICE PLUS FIBRE PO) Take 6 tablets by mouth in the morning and at bedtime. Fruit Blend (2) + Vegetable Blend (2) + Berry Blend twice daily     ramipril (ALTACE) 5 MG capsule Take 5 mg by mouth daily.      rosuvastatin (CRESTOR) 20 MG tablet Take 20 mg by mouth daily with supper.      No current facility-administered medications for this visit.     Allergies:   Patient has no known allergies.   Social History:  The patient  reports that she has never smoked. She has never used smokeless tobacco. She reports that she does not drink alcohol and does not use drugs.   Family History:   family history includes Breast cancer in her sister and sister; Diabetes in her father; Heart disease in her mother and another family member.    Review of Systems: Review of Systems  Constitutional: Negative.   HENT: Negative.    Respiratory: Negative.    Cardiovascular: Negative.   Gastrointestinal: Negative.   Musculoskeletal: Negative.   Neurological: Negative.   Psychiatric/Behavioral: Negative.    All other systems reviewed and are negative.   PHYSICAL EXAM: VS:  BP 120/80 (BP Location: Left Arm, Patient Position: Sitting)   Pulse 83   Ht 5\' 6"  (1.676 m)   Wt 181 lb 9.6 oz (82.4 kg)   SpO2 98%   BMI 29.31 kg/m  , BMI Body mass index is 29.31 kg/m. GEN: Well nourished, well developed, in no acute distress HEENT: normal Neck: no JVD, carotid bruits, or masses Cardiac: RRR; no murmurs, rubs, or gallops,no edema  Respiratory:  clear to auscultation bilaterally, normal work of breathing GI: soft, nontender, nondistended, + BS MS: no deformity or atrophy Skin: warm and dry, no rash Neuro:  Strength and sensation are intact Psych: euthymic mood, full affect  Recent Labs: No results found for requested labs within last 8760 hours.     Lipid Panel Lab Results  Component Value Date   CHOL 93 06/15/2018   HDL 34 (L) 06/15/2018   LDLCALC 47 06/15/2018   TRIG 59 06/15/2018      Wt Readings from Last 3 Encounters:  10/09/20 181 lb 9.6 oz (82.4 kg)  09/27/19 182 lb (82.6 kg)  06/25/19 180 lb 6.4 oz (81.8 kg)      ASSESSMENT AND PLAN:  Problem List Items Addressed This Visit   None Visit Diagnoses     Coronary artery disease of native artery of native heart with stable angina pectoris ([place])    -  Primary   Mixed hyperlipidemia       Benign essential HTN       Diabetes mellitus type 2 with complications ([place])          Coronary artery disease with stable angina Currently with no  symptoms of angina. No further workup at this time. Continue current medication regimen. Stressed importance of aggressive diabetes control, Cholesterol is at goal Long discussion concerning anginal symptoms to watch for Discussed testing that could be done  Hyperlipidemia Cholesterol is at goal on the current lipid regimen. No changes to the medications were made.  HTN Blood pressure is well controlled on today's visit. No changes made to the medications.  Diabetes: type 2 We have encouraged continued exercise, careful diet management in an effort to lose weight.    Total encounter time more than 25 minutes  Greater than 50% was spent in counseling and coordination of care with the patient    Signed, Esmond Plants, M.D., Ph.D. [place], [place]

## 2020-10-09 NOTE — Patient Instructions (Signed)
Call the office for chest pain, shortness of breath concerning for heart blockage/angina  Medication Instructions:  No changes  If you need a refill on your cardiac medications before your next appointment, please call your pharmacy.    Lab work: No new labs needed   If you have labs (blood work) drawn today and your tests are completely normal, you will receive your results only by: [place] (if you have MyChart) OR A paper copy in the mail If you have any lab test that is abnormal or we need to change your treatment, we will call you to review the results.   Testing/Procedures: No new testing needed   Follow-Up: At [facility], you and your health needs are our priority.  As part of our continuing mission to provide you with exceptional heart care, we have created designated Provider Care Teams.  These Care Teams include your primary Cardiologist (physician) and Advanced Practice Providers (APPs -  Physician Assistants and Nurse Practitioners) who all work together to provide you with the care you need, when you need it.  You will need a follow up appointment in 12 months with Dr. Fletcher Anon  Providers on your designated Care Team:   Murray Hodgkins, NP Christell Faith, PA-C Marrianne Mood, PA-C Cadence Kathlen Mody, [place]  Any Other Special Instructions Will Be Listed Below (If Applicable).  COVID-19 Vaccine Information can be found at: [email or web address].uk For questions related to vaccine distribution or appointments, please email [email or web address] or call [phone].

## 2020-10-10 NOTE — Addendum Note (Signed)
Addended by: Britt Bottom on: 10/10/2020 09:39 AM   Modules accepted: Orders

## 2020-10-18 ENCOUNTER — Other Ambulatory Visit: Payer: Self-pay | Admitting: Nurse Practitioner

## 2020-10-18 DIAGNOSIS — I493 Ventricular premature depolarization: Secondary | ICD-10-CM

## 2021-06-25 IMAGING — US US THYROID
1 series · 13 of 25 positions shown · non-contrast
Comparison: None.

CLINICAL DATA: 63-year-old female with a history of left thyroid
nodule

EXAM:
THYROID ULTRASOUND
TECHNIQUE: Ultrasound examination of the thyroid gland and adjacent soft
tissues was performed.

[Series 1: us thyroid · 0.07mm/px · 13 of 39 slices shown]
[im 1/39]
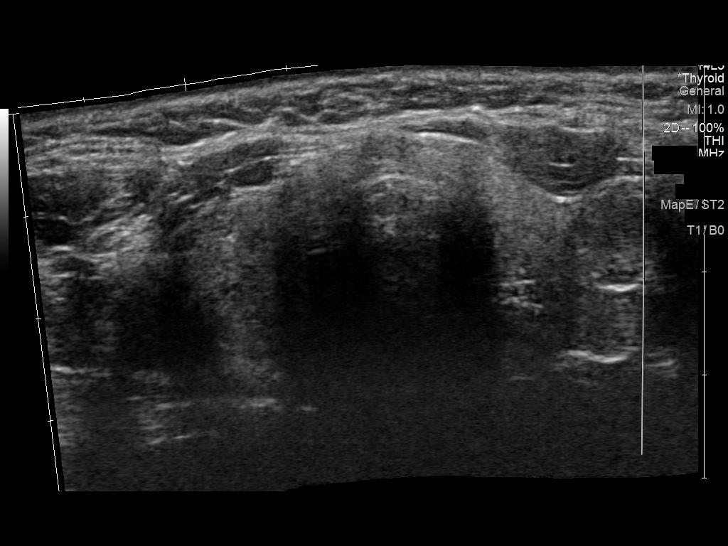
[im 4/39]
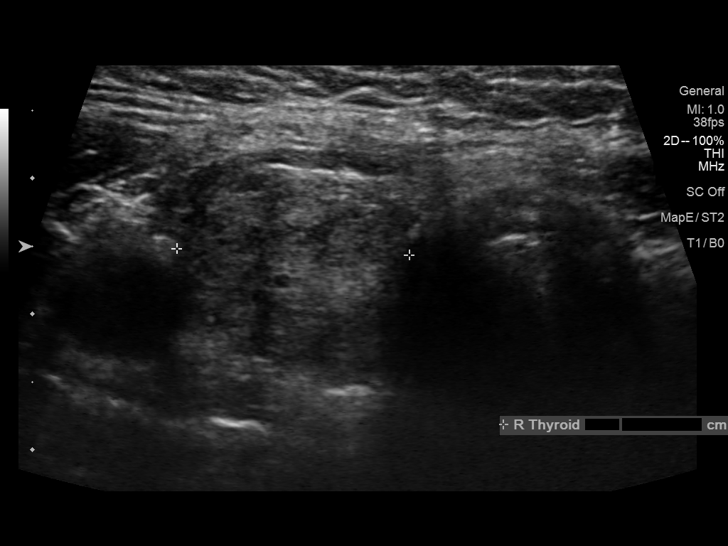
[im 7/39]
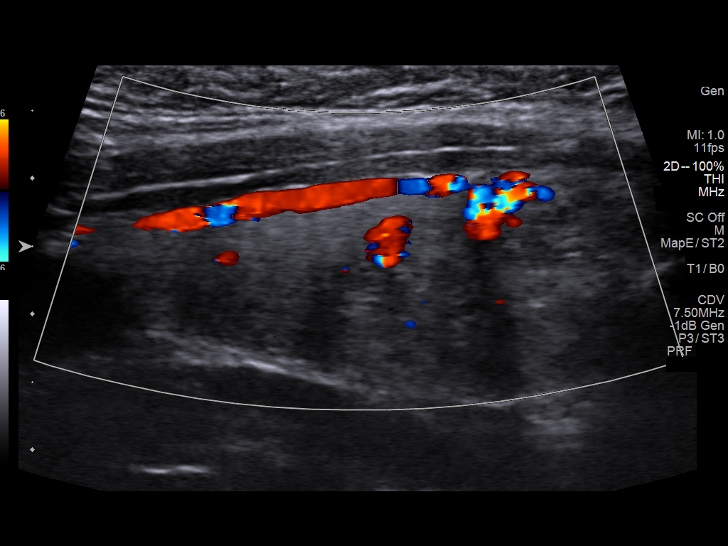
[im 10/39]
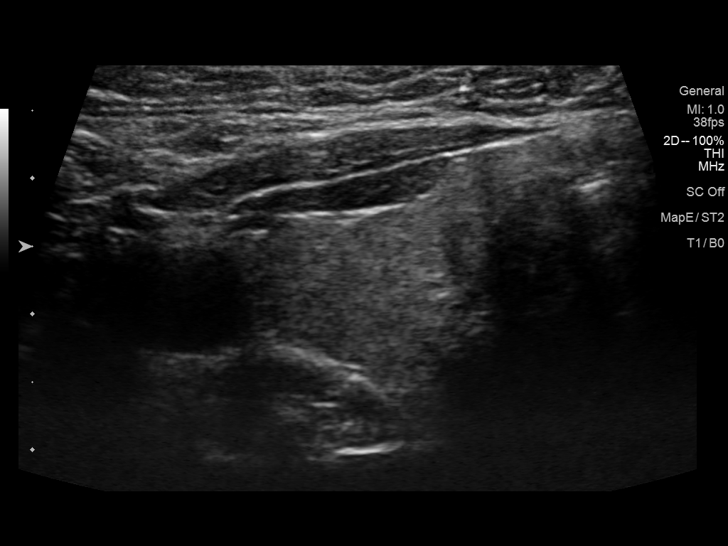
[im 13/39]
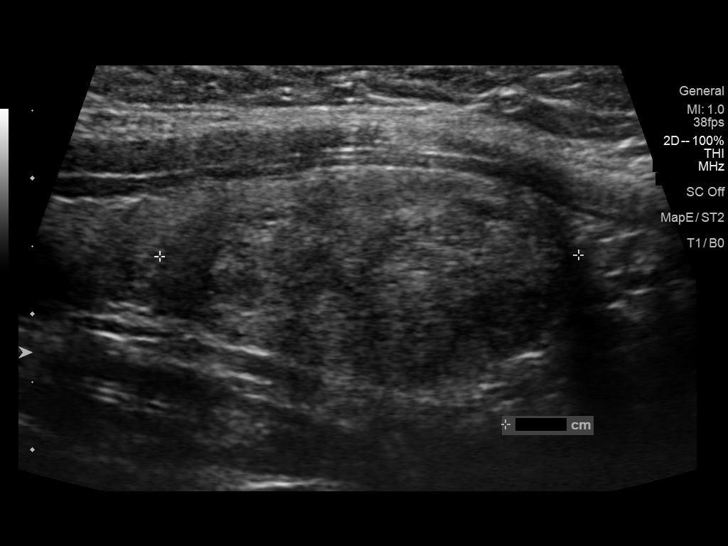
[im 16/39]
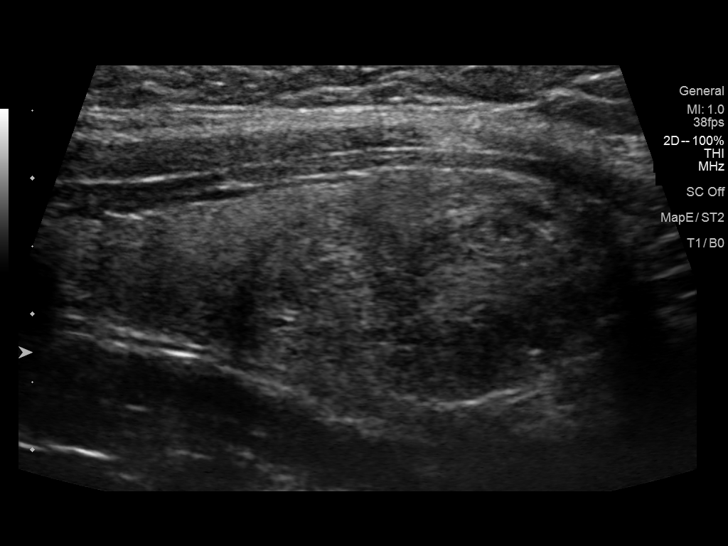
[im 20/39]
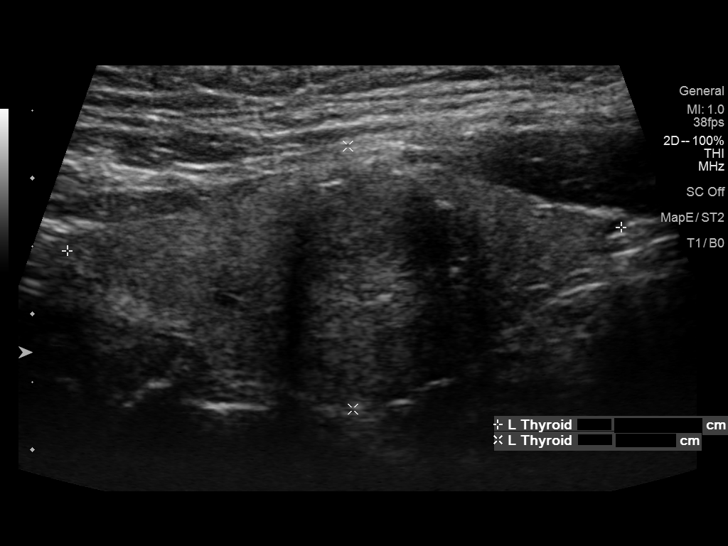
[im 23/39]
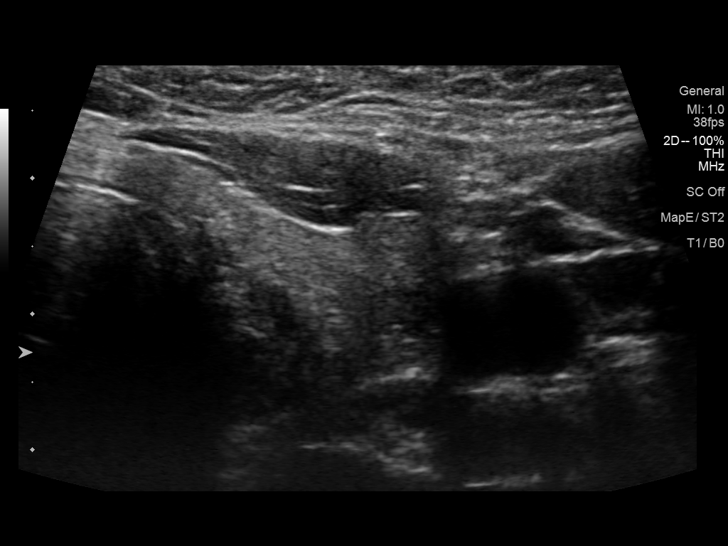
[im 26/39]
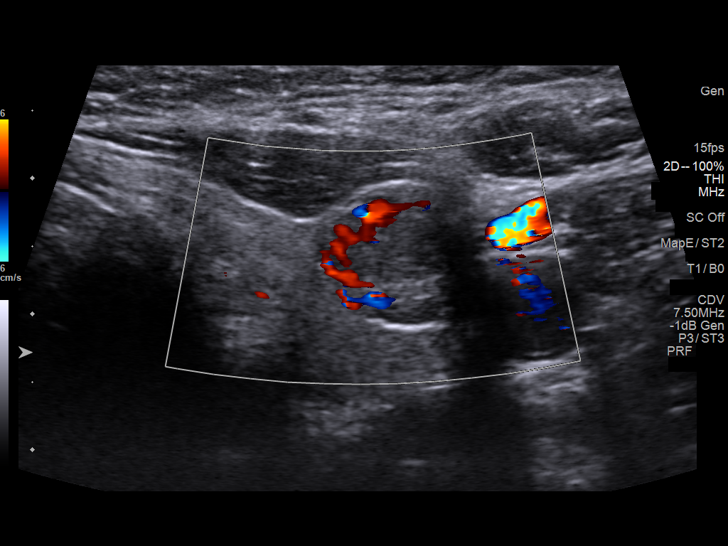
[im 29/39]
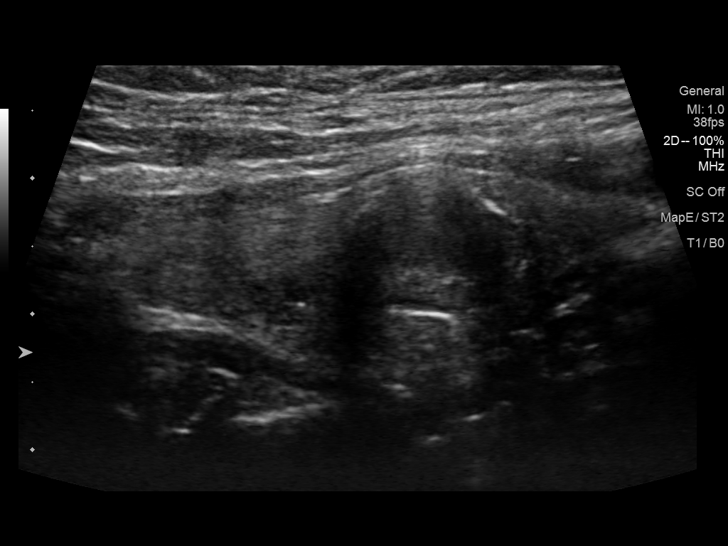
[im 32/39]
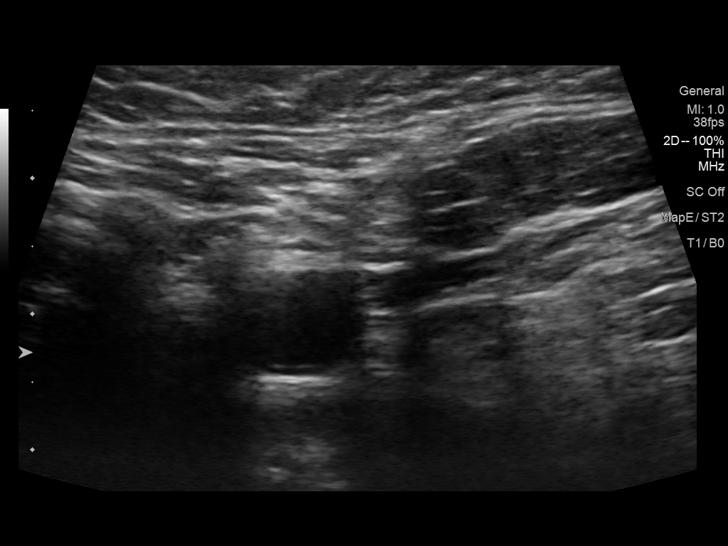
[im 35/39]
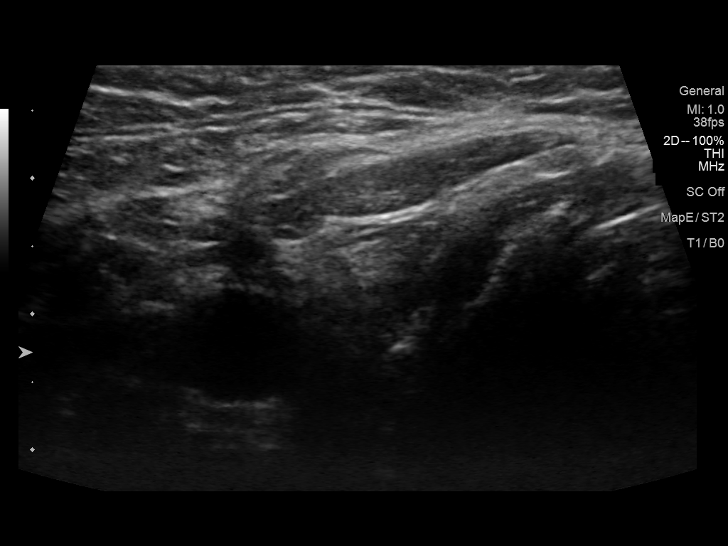
[im 39/39]
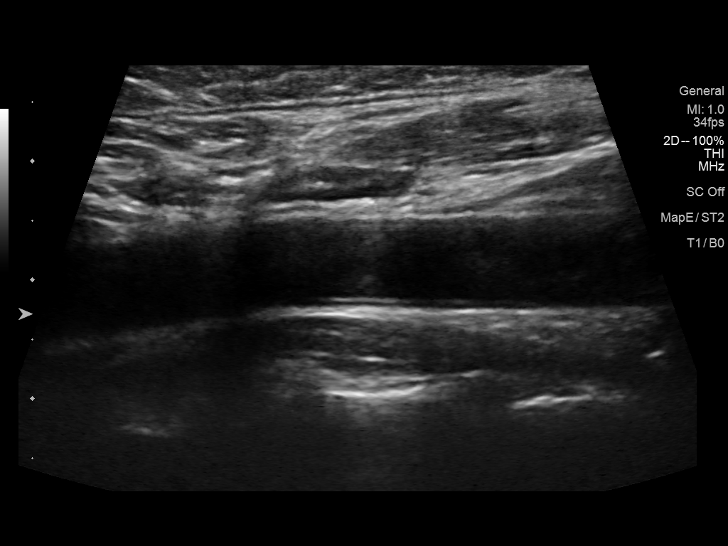

[13 of 25 positions shown; findings below may reference images not displayed]

FINDINGS: Parenchymal Echotexture: Normal

Isthmus: 0.3 cm

Right lobe: 4.4 cm x 1.7 cm x 1.7 cm

Left lobe: 4.1 cm x 1.9 cm x 2.1 cm

_________________________________________________________

Estimated total number of nodules >/= 1 cm: 1

Number of spongiform nodules >/=  2 cm not described below (TR1): 0

Number of mixed cystic and solid nodules >/= 1.5 cm not described
below (TR2): 0

_________________________________________________________

Nodule # 1:

Location: Right; Inferior

Maximum size: 3.1 cm; Other 2 dimensions: 2.0 cm x 1.5 cm

Composition: solid/almost completely solid (2)

Echogenicity: isoechoic (1)

Shape: not taller-than-wide (0)

Margins: ill-defined (0)

Echogenic foci: none (0)

ACR TI-RADS total points: 3.

ACR TI-RADS risk category: TR3 (3 points).

ACR TI-RADS recommendations:

Nodule meets criteria for biopsy

_________________________________________________________

Nodule # 2:

Location: Left; Mid

Maximum size: 1.2 cm; Other 2 dimensions: 1.0 cm x 1.0 cm

Composition: solid/almost completely solid (2)

Echogenicity: hypoechoic (2)

Shape: not taller-than-wide (0)

Margins: ill-defined (0)

Echogenic foci: peripheral calcifications (2)

ACR TI-RADS total points: 6.

ACR TI-RADS risk category: TR4 (4-6 points).

ACR TI-RADS recommendations:

Nodule meets criteria for surveillance

_________________________________________________________

No adenopathy
IMPRESSION: Right inferior thyroid nodule (labeled 1) meets criteria for biopsy,
as designated by the newly established ACR TI-RADS criteria, and
referral for biopsy is recommended.

Left mid thyroid nodule (labeled 2) meets criteria for surveillance,
as designated by the newly established ACR TI-RADS criteria.
Surveillance ultrasound study recommended to be performed annually
up to 5 years.

Recommendations follow those established by the new ACR TI-RADS
criteria ([HOSPITAL] 5140;[DATE]).

## 2021-06-26 ENCOUNTER — Other Ambulatory Visit: Payer: Self-pay | Admitting: Cardiovascular Disease

## 2021-06-26 DIAGNOSIS — I493 Ventricular premature depolarization: Secondary | ICD-10-CM

## 2021-07-24 IMAGING — US US FNA BIOPSY THYROID 1ST LESION
1 series · 11 of 11 positions shown · non-contrast
Comparison: Thyroid ultrasound 03/05/2019

MEDICATIONS:
None

COMPLICATIONS:
None immediate.

INDICATION: Indeterminate inferior thyroid nodule

EXAM:
ULTRASOUND GUIDED FINE NEEDLE ASPIRATION OF INDETERMINATE RIGHT
INFERIOR THYROID NODULE
TECHNIQUE: Informed written consent was obtained from the patient after a
discussion of the risks, benefits and alternatives to treatment.
Questions regarding the procedure were encouraged and answered. A
timeout was performed prior to the initiation of the procedure.

[Series 1: us fna biopsy thyroid 1st lesion · 0.06mm/px · 11 acquisitions, 11 frames shown]
[im 1/11]
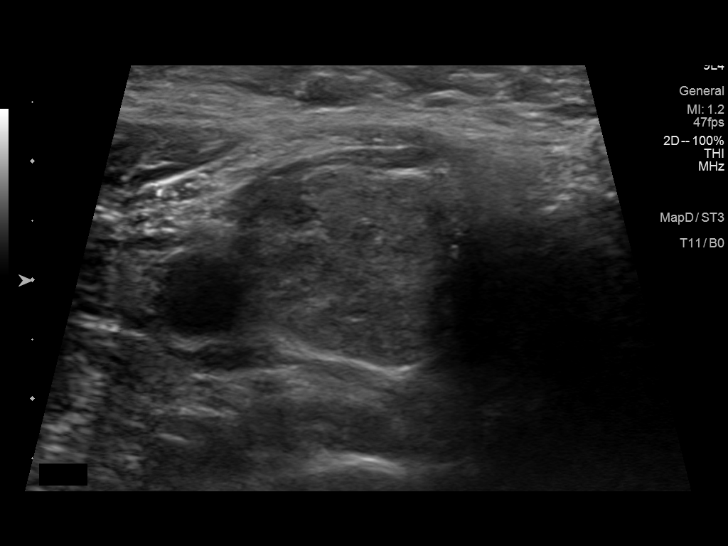
[im 2/11]
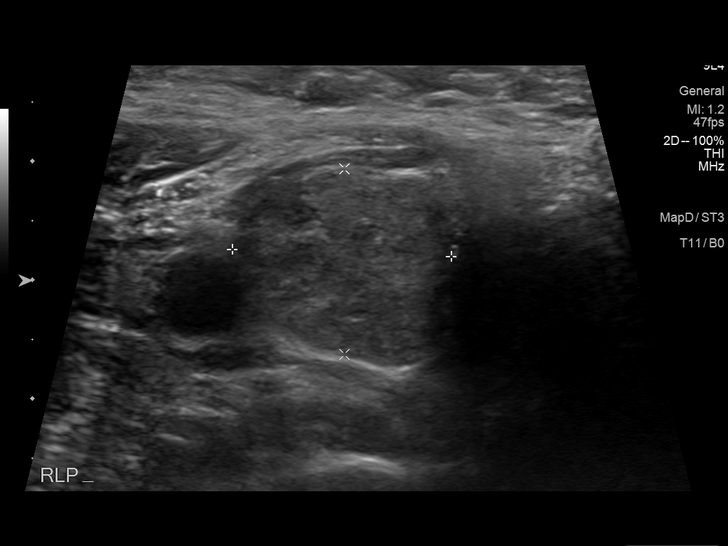
[im 3/11]
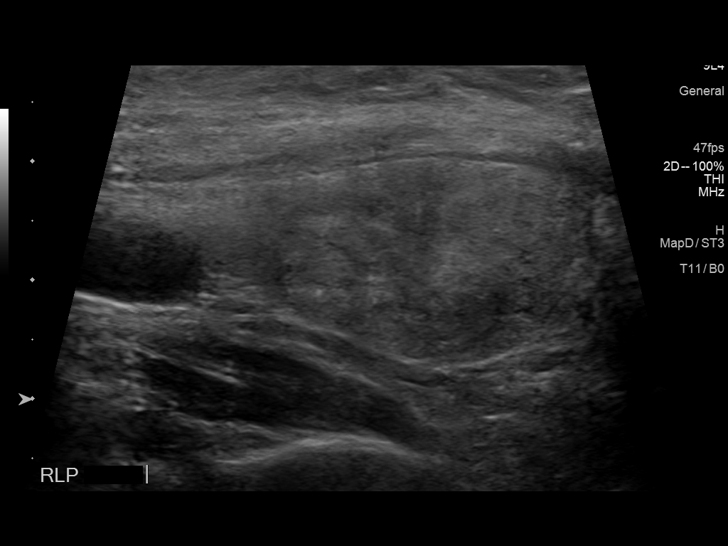
[im 4/11]
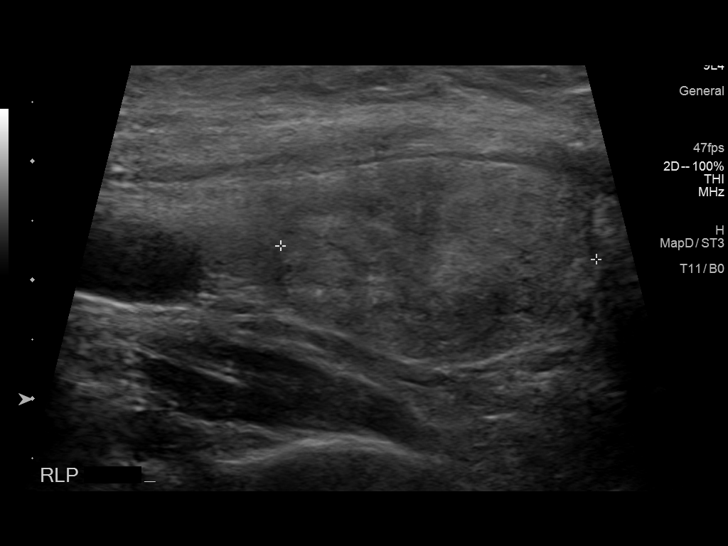
[im 5/11]
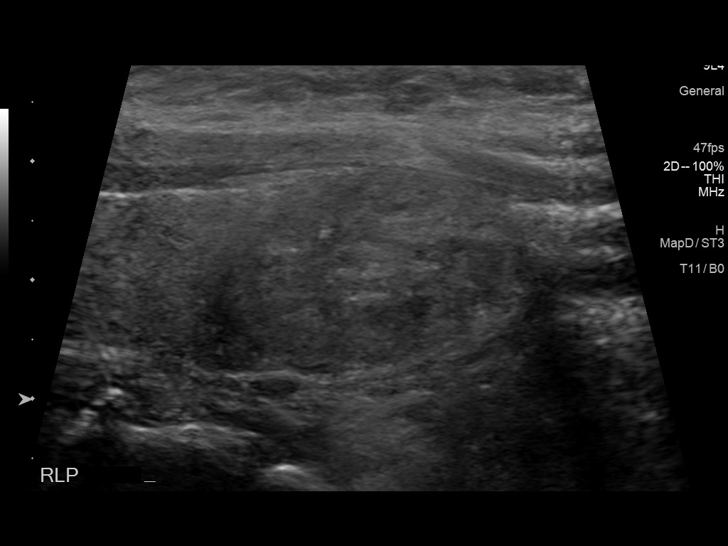
[im 6/11]
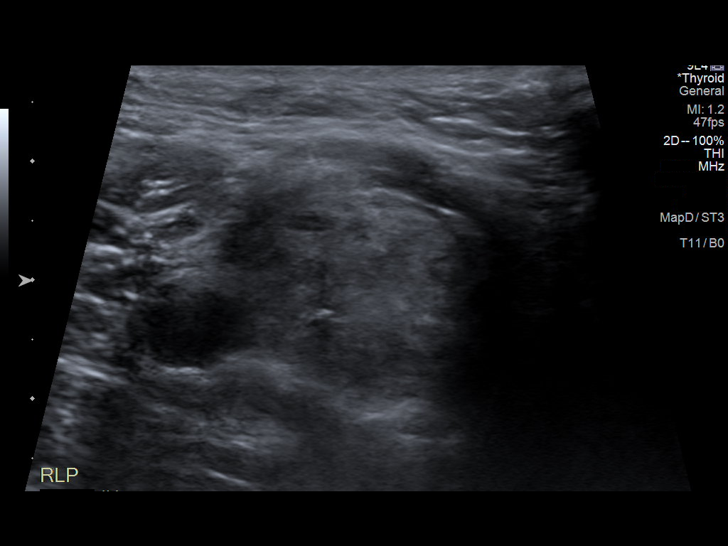
[im 7/11]
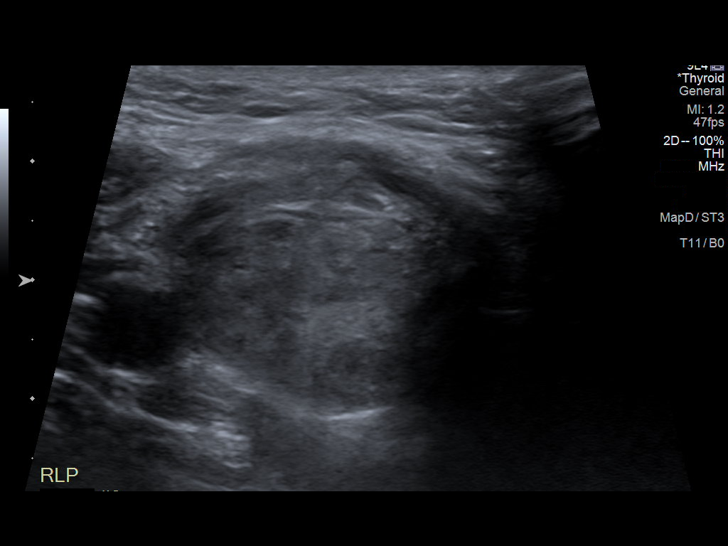
[im 8/11]
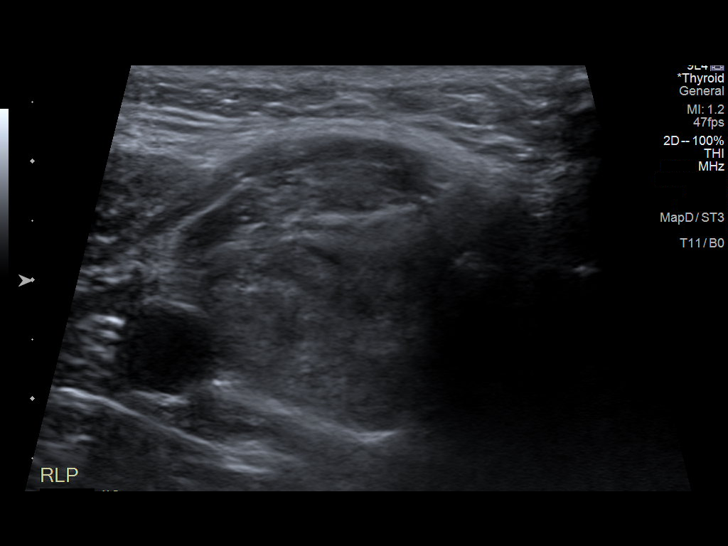
[im 9/11]
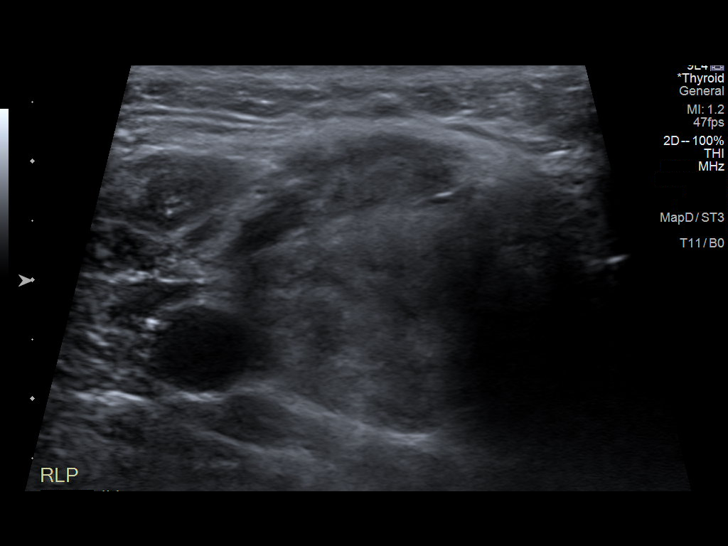
[im 10/11]
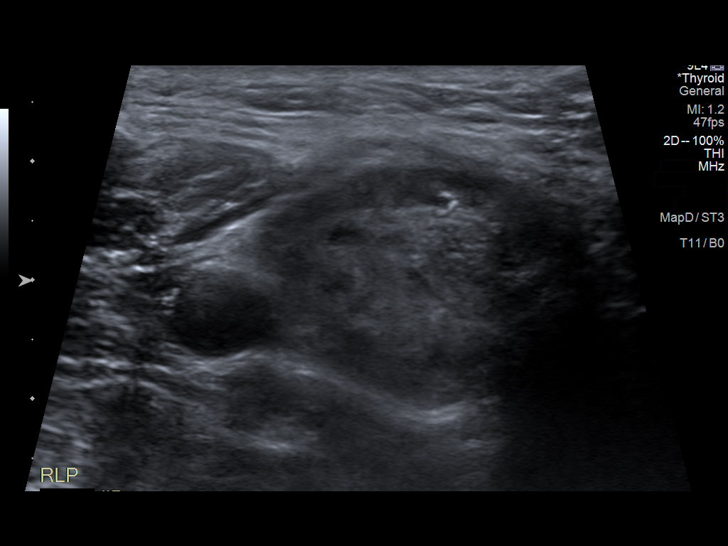
[im 11/11]
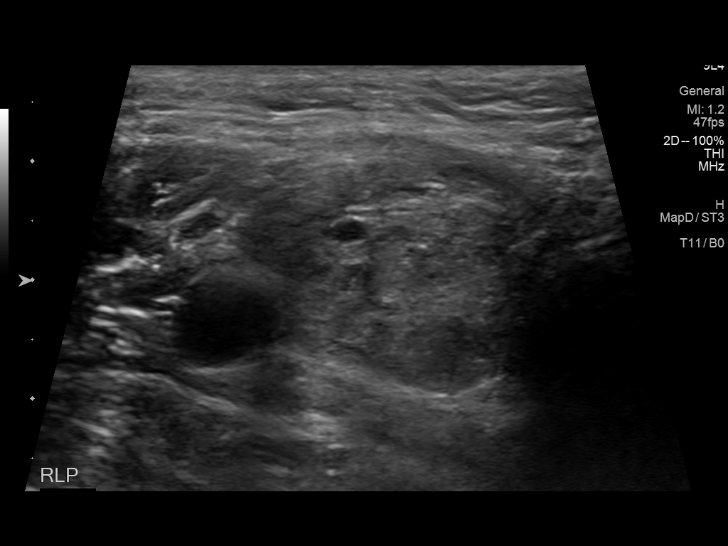

[11 of 11 positions shown; findings below may reference images not displayed]

Pre-procedural ultrasound scanning demonstrated unchanged size and
appearance of the indeterminate nodule within the right inferior
thyroid lobe

The procedure was planned. The neck was prepped in the usual sterile
fashion, and a sterile drape was applied covering the operative
field. A timeout was performed prior to the initiation of the
procedure. Local anesthesia was provided with 1% lidocaine.

Under direct ultrasound guidance, 5 FNA biopsies were performed of
the right inferior thyroid nodule with a 25 gauge needle. Multiple
ultrasound images were saved for procedural documentation purposes.
The samples were prepared and submitted to pathology.

Limited post procedural scanning was negative for hematoma or
additional complication. Dressings were placed. The patient
tolerated the above procedures procedure well without immediate
postprocedural complication.
FINDINGS: FINDINGS
Nodule reference number based on prior diagnostic ultrasound: 1

Maximum size: 3.1 cm

Location: Right  ;  Inferior

ACR TI-RADS total points: 3

ACR TI-RADS risk category:  TR3 (3 points)

Prior biopsy:  No

Reason for biopsy: meets ACR TI-RADS criteria

Ultrasound imaging confirms appropriate placement of the needles
within the thyroid nodule.
IMPRESSION: Technically successful ultrasound guided fine needle aspiration of
right inferior thyroid nodule as described above.

## 2021-09-14 ENCOUNTER — Other Ambulatory Visit: Payer: Self-pay | Admitting: Cardiovascular Disease

## 2021-09-14 DIAGNOSIS — I493 Ventricular premature depolarization: Secondary | ICD-10-CM

## 2021-09-15 NOTE — Telephone Encounter (Signed)
LMOV  

## 2021-09-15 NOTE — Telephone Encounter (Signed)
Please schedule 12 month F/U appt for 90 day refills. Thank you! 

## 2021-10-29 ENCOUNTER — Other Ambulatory Visit: Payer: Self-pay | Admitting: Cardiovascular Disease

## 2021-10-29 DIAGNOSIS — I493 Ventricular premature depolarization: Secondary | ICD-10-CM

## 2021-11-02 ENCOUNTER — Ambulatory Visit: Payer: Medicare Other | Admitting: Medical

## 2022-01-08 ENCOUNTER — Other Ambulatory Visit: Payer: Self-pay | Admitting: Cardiovascular Disease

## 2022-01-08 DIAGNOSIS — I493 Ventricular premature depolarization: Secondary | ICD-10-CM

## 2022-02-09 ENCOUNTER — Other Ambulatory Visit: Payer: Self-pay | Admitting: Cardiovascular Disease

## 2022-02-09 DIAGNOSIS — I493 Ventricular premature depolarization: Secondary | ICD-10-CM

## 2022-03-03 ENCOUNTER — Telehealth: Payer: Self-pay | Admitting: Gastroenterology

## 2022-03-03 NOTE — Telephone Encounter (Signed)
Good Afternoon Dr. Tarri Glenn,   Patient called stating that she had a referral in from her PCP Dr. Osborne Casco for B12 and iron deficiency. Patient is requesting transfer of care to you from Dr. Earlean Shawl retiring and patient not wanting to stay in [facility] network. Patient has history of having issues with hemorrhoids, and was last seen for an office visit in January of 2023. Patient also had a virtual CT with Dr. Earlean Shawl  in May of 2023. Patients records are in epic, will you please review and advise on scheduling patient?   Thank you.

## 2022-03-05 ENCOUNTER — Ambulatory Visit: Payer: Medicare Other | Admitting: Cardiovascular Disease

## 2022-03-05 NOTE — Telephone Encounter (Signed)
Lvm fot patient to call back to schedule and advised on recommendations for her care.

## 2022-03-08 ENCOUNTER — Other Ambulatory Visit: Payer: Self-pay | Admitting: Cardiovascular Disease

## 2022-03-08 ENCOUNTER — Encounter: Payer: Self-pay | Admitting: Cardiology

## 2022-03-08 ENCOUNTER — Ambulatory Visit: Payer: Medicare Other | Attending: Medical | Admitting: Cardiology

## 2022-03-08 VITALS — BP 138/78 | HR 100 | Ht 66.0 in | Wt 180.2 lb

## 2022-03-08 DIAGNOSIS — I10 Essential (primary) hypertension: Secondary | ICD-10-CM | POA: Diagnosis not present

## 2022-03-08 DIAGNOSIS — I493 Ventricular premature depolarization: Secondary | ICD-10-CM

## 2022-03-08 DIAGNOSIS — I25118 Atherosclerotic heart disease of native coronary artery with other forms of angina pectoris: Secondary | ICD-10-CM

## 2022-03-08 DIAGNOSIS — E782 Mixed hyperlipidemia: Secondary | ICD-10-CM

## 2022-03-08 DIAGNOSIS — E118 Type 2 diabetes mellitus with unspecified complications: Secondary | ICD-10-CM

## 2022-03-08 DIAGNOSIS — E039 Hypothyroidism, unspecified: Secondary | ICD-10-CM

## 2022-03-08 MED ORDER — METOPROLOL SUCCINATE ER 25 MG PO TB24: 25.0000 mg | ORAL_TABLET | Freq: Every day | ORAL | 2 refills | Status: DC

## 2022-03-08 NOTE — Patient Instructions (Signed)
Medication Instructions:  Your physician recommends that you continue on your current medications as directed. Please refer to the Current Medication list given to you today.  *If you need a refill on your cardiac medications before your next appointment, please call your pharmacy*   Lab Work: None ordered  If you have labs (blood work) drawn today and your tests are completely normal, you will receive your results only by: [place] (if you have MyChart) OR A paper copy in the mail If you have any lab test that is abnormal or we need to change your treatment, we will call you to review the results.   Testing/Procedures: None ordered  Follow-Up: At [facility], you and your health needs are our priority.  As part of our continuing mission to provide you with exceptional heart care, we have created designated Provider Care Teams.  These Care Teams include your primary Cardiologist (physician) and Advanced Practice Providers (APPs -  Physician Assistants and Nurse Practitioners) who all work together to provide you with the care you need, when you need it.  We recommend signing up for the patient portal called "MyChart".  Sign up information is provided on this After Visit Summary.  MyChart is used to connect with patients for Virtual Visits (Telemedicine).  Patients are able to view lab/test results, encounter notes, upcoming appointments, etc.  Non-urgent messages can be sent to your provider as well.   To learn more about what you can do with MyChart, go to [email or web address].    Your next appointment:   1 year  The format for your next appointment:   In Person  Provider:   You may see Kathlyn Sacramento, MD or one of the following Advanced Practice Providers on your designated Care Team:   Murray Hodgkins, NP Christell Faith, PA-C Cadence Kathlen Mody, PA-C Gerrie Nordmann, NP   Important Information About [place]

## 2022-03-08 NOTE — Progress Notes (Signed)
Cardiology Clinic Note   Patient Name: [name] Date of Encounter: 03/08/2022  Primary Care Provider:  Haywood Pao, MD Primary Cardiologist:  Kathlyn Sacramento, MD  Patient Profile    66 year old female with a past medical history of coronary atherosclerosis, type 2 diabetes, hyperlipidemia, anemia, asymptomatic PVCs, who presents today for follow-up of coronary atherosclerosis.  Past Medical History    Past Medical History:  Diagnosis Date   Anemia    Arthritis    Atypical chest pain    a. 05/2018 CTA chest: No PE; b. 05/2018 MV: No ischemia/scar. EF 65%.   Coronary artery calcification seen on CT scan    a. 05/2018 CT Chest: Coronary and Ao atherosclerotic Calcifications.   Diabetes mellitus without complication (HCC)    Diastolic dysfunction    a. 05/2018 Echo: EF 60-65%, impaired relaxation. Nl RVSP.   Dysrhythmia    PVC's   Family history of adverse reaction to anesthesia    sister-malignant hyperthermia   FH of Malignant hyperthermia    a. sister had malginant hyperthermia   History of renal insufficiency    History of skin cancer    Hyperlipemia    Osteoporosis    Pulmonary nodules    a. 05/2018 CT Chest: Small pulm nodules measuring up to 8m avg diameter. Rec non-contrast Chest CT in 6-12 mos.   Thyroid nodule    Past Surgical History:  Procedure Laterality Date   CESAREAN SECTION     CHOLECYSTECTOMY  2006   lap choli with umb hernia   COLONOSCOPY     LIPOMA EXCISION Left 03/15/2014   Procedure: EXCISION LIPOMA LEFT LOWER Q[place]DOMINAL WALL;  Surgeon: BGeorganna Skeans MD;  [facility]  Service: General;  Laterality: Left;   THYROIDECTOMY N/A 06/25/2019   Procedure: TOTAL THYROIDECTOMY;  Surgeon: GArmandina Gemma MD;  Location: [facility];  Service: General;  Laterality: N/A;   TONSILLECTOMY     TRIGGER FINGER RELEASE  2012   lt small,middle,long   TRIGGER FINGER RELEASE Right 07/14/2017   Procedure: RELEASE TRIGGER  FINGER/A-1 PULLEY RIGHT THUMB;  Surgeon: KDaryll Brod MD;  [facility]  Service: Orthopedics;  Laterality: Right;   UMBILICAL HERNIA REPAIR  2006   with lap choli    Allergies  No Known Allergies  History of Present Illness    WClinton Wahlbergis a 66year old female with a previously mentioned past medical history of coronary atherosclerosis, asymptomatic PVCs, type 2 diabetes, hyperlipidemia, and anemia.  She was hospitalized in March 2020 with atypical chest pain.  Echocardiogram showed normal LV systolic function and no regional wall motion abnormalities.  CT of the chest was negative for pulmonary embolism but did show coronary and aortic calcifications as well as small pulmonary nodules.  She underwent stress testing which was negative for ischemia.  Pulmonary nodules have been stable on repeat CT imaging.  She was found to have thyroid nodules and underwent thyroidectomy on March 2021.  Pathology was benign and she had been continued on thyroid replacement therapy.  Was last seen in clinic 10/09/2020 by Dr. GRockey Situ  Was doing well at that time with no complaints.  Been compliant with medication reports blood pressure typically been well-controlled with continued with statin therapy without incident.  There were no changes made to her medications and no further testing that was ordered.  She returns to clinic today stating that overall she has been doing well.  She denies any chest pain, shortness  of breath, dyspnea exertion, or peripheral edema.  She states that she has been diagnosed with iron deficiency anemia as well as vitamin B12 deficiency and has been by her PCP and GI.  She also over the past year had a tib-fib fracture to the right lower extremity from a mechanical fall.  Denies any recent hospitalizations or visits to the emergency department  Home Medications    Current Outpatient Medications  Medication Sig Dispense Refill   aspirin EC 81 MG tablet  Take 81 mg by mouth daily.     ciprofloxacin (CIPRO) 250 MG tablet Take 250 mg by mouth See admin instructions. Take 1 tablet (250 mg) by mouth daily as needed after sexual intercourse.     Cyanocobalamin (VITAMIN B-12 PO) Take by mouth. Takes every 31 days.(Inj.)     Insulin Glargine (BASAGLAR KWIKPEN) 100 UNIT/ML SOPN Inject 35 Units into the skin at bedtime.     insulin lispro (HUMALOG) 100 UNIT/ML injection Inject 0-15 Units into the skin 3 (three) times daily before meals. Sliding scale     levothyroxine (SYNTHROID) 112 MCG tablet Take 112 mcg by mouth daily before breakfast.     metFORMIN (GLUCOPHAGE) 1000 MG tablet Take 1,000 mg by mouth 2 (two) times daily with a meal.     Nutritional Supplements (JUICE PLUS FIBRE PO) Take 6 tablets by mouth in the morning and at bedtime. Fruit Blend (2) + Vegetable Blend (2) + Berry Blend twice daily     ramipril (ALTACE) 5 MG capsule Take 5 mg by mouth daily.      rosuvastatin (CRESTOR) 20 MG tablet Take 20 mg by mouth daily with supper.      alendronate (FOSAMAX) 70 MG tablet Take 70 mg by mouth every Sunday. Take with a full glass of water on an empty stomach.  (Patient not taking: Reported on 03/08/2022)     Calcium Carb-Cholecalciferol (CALCIUM 600 + D PO) Take 1 tablet by mouth daily. (Patient not taking: Reported on 03/08/2022)     metoprolol succinate (TOPROL-XL) 25 MG 24 hr tablet Take 1 tablet (25 mg total) by mouth daily. 90 tablet 2   No current facility-administered medications for this visit.     Family History    Family History  Problem Relation Age of Onset   Breast cancer Sister    Breast cancer Sister    Heart disease Mother    Diabetes Father    Heart disease Other    She indicated that the status of her mother is unknown. She indicated that the status of her father is unknown. She indicated that her other is alive.  Social History    Social History   Socioeconomic History   Marital status: Married    Spouse name: Not on  file   Number of children: Not on file   Years of education: Not on file   Highest education level: Not on file  Occupational History   Not on file  Tobacco Use   Smoking status: Never   Smokeless tobacco: Never  Vaping Use   Vaping Use: Not on file  Substance and Sexual Activity   Alcohol use: No   Drug use: No   Sexual activity: Not on file  Other Topics Concern   Not on file  Social History Narrative   Not on file   Social Determinants of Health   Financial Resource Strain: Not on file  Food Insecurity: Not on file  Transportation Needs: Not on file  Physical Activity:  Not on file  Stress: Not on file  Social Connections: Not on file  Intimate Partner Violence: Not on file     Review of Systems    General:  No chills, fever, night sweats or weight changes.  Cardiovascular:  No chest pain, dyspnea on exertion, endorses occasional peripheral edema, orthopnea, palpitations, paroxysmal nocturnal dyspnea. Dermatological: No rash, lesions/masses Respiratory: No cough, dyspnea Urologic: No hematuria, dysuria Abdominal:   No nausea, vomiting, diarrhea, bright red blood per rectum, melena, or hematemesis Neurologic:  No visual changes, wkns, changes in mental status. All other systems reviewed and are otherwise negative except as noted above.   Physical Exam    VS:  BP 138/78 (BP Location: Left Arm, Patient Position: Sitting, Cuff Size: Normal)   Pulse 100   Ht '5\' 6"'$  (1.676 m)   Wt 180 lb 3.2 oz (81.7 kg)   SpO2 98%   BMI 29.09 kg/m  , BMI Body mass index is 29.09 kg/m.     GEN: Well nourished, well developed, in no acute distress. HEENT: normal. Neck: Supple, no JVD, carotid bruits, or masses. Cardiac: RRR, no murmurs, rubs, or gallops. No clubbing, cyanosis, edema.  Radials 2+/PT 2+ and equal bilaterally.  Respiratory:  Respirations regular and unlabored, clear to auscultation bilaterally. GI: Soft, nontender, nondistended, BS + x 4. MS: no deformity or  atrophy. Skin: warm and dry, no rash. Neuro:  Strength and sensation are intact. Psych: Normal affect.  Accessory Clinical Findings    ECG personally reviewed by me today-sinus tach at a rate of 100 with unifocal occasional PVCs noted with a left axis deviation nonspecific T waves- No acute changes  Lab Results  Component Value Date   WBC 7.8 06/19/2019   HGB 14.2 06/19/2019   HCT 42.8 06/19/2019   MCV 88.4 06/19/2019   PLT 370 06/19/2019   Lab Results  Component Value Date   CREATININE 0.54 06/26/2019   BUN 12 06/26/2019   NA 133 (L) 06/26/2019   K 3.9 06/26/2019   CL 97 (L) 06/26/2019   CO2 24 06/26/2019   Lab Results  Component Value Date   ALT 19 06/15/2018   AST 18 06/15/2018   ALKPHOS 54 06/15/2018   BILITOT 0.5 06/15/2018   Lab Results  Component Value Date   CHOL 93 06/15/2018   HDL 34 (L) 06/15/2018   LDLCALC 47 06/15/2018   TRIG 59 06/15/2018   CHOLHDL 2.7 06/15/2018    Lab Results  Component Value Date   HGBA1C 7.1 (H) 06/26/2019    Assessment & Plan   1.  Coronary atherosclerosis noted on CT.  She is doing well without anginal symptoms.  EKG shows no ischemic changes.  She is continued on aspirin and statin for secondary prevention.  2.  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  She is asymptomatic and has been continued on metoprolol XL 25 mg daily.  Refill on metoprolol sent to pharmacy of choice for 90 days.  3.  Essential hypertension with blood pressure 138/78 which has remained stable and she is continued on her current medication regimen of Toprol-XL 25 mg daily and ramipril 5 mg daily.  She is also been encouraged to continue monitoring her blood pressures at home.  4.  Hyperlipidemia the patient states she recently had blood work done at her primary care provider.  We are requesting those results are noted unable to be seen in the system today.  At this point time she has been  continued on rosuvastatin 20 mg daily.  5.  Type 2  diabetes continued on metformin and insulin.  Managed by PCP.  6.  Hypothyroidism continued on levothyroxine continues to be managed by PCP.  7.  Disposition patient return to clinic to see MD/APP in 1 year or sooner if needed  Regan Mcbryar, NP 03/08/2022, 4:31 PM

## 2022-09-22 ENCOUNTER — Other Ambulatory Visit: Payer: Self-pay | Admitting: Internal Medicine

## 2022-09-22 DIAGNOSIS — Z1231 Encounter for screening mammogram for malignant neoplasm of breast: Secondary | ICD-10-CM

## 2022-10-14 ENCOUNTER — Ambulatory Visit
Admission: RE | Admit: 2022-10-14 | Discharge: 2022-10-14 | Disposition: A | Discharge status: Home / Self Care | Payer: Medicare Other | Source: Ambulatory Visit | Attending: Internal Medicine | Admitting: Internal Medicine

## 2022-10-14 DIAGNOSIS — Z1231 Encounter for screening mammogram for malignant neoplasm of breast: Secondary | ICD-10-CM

## 2022-10-18 ENCOUNTER — Other Ambulatory Visit: Payer: Self-pay | Admitting: Internal Medicine

## 2022-10-18 DIAGNOSIS — R928 Other abnormal and inconclusive findings on diagnostic imaging of breast: Secondary | ICD-10-CM

## 2022-10-21 ENCOUNTER — Ambulatory Visit
Admission: RE | Admit: 2022-10-21 | Discharge: 2022-10-21 | Disposition: A | Discharge status: Home / Self Care | Payer: Medicare Other | Source: Ambulatory Visit | Attending: Internal Medicine | Admitting: Internal Medicine

## 2022-10-21 ENCOUNTER — Other Ambulatory Visit: Payer: Self-pay | Admitting: Internal Medicine

## 2022-10-21 DIAGNOSIS — R928 Other abnormal and inconclusive findings on diagnostic imaging of breast: Secondary | ICD-10-CM

## 2022-10-21 DIAGNOSIS — R921 Mammographic calcification found on diagnostic imaging of breast: Secondary | ICD-10-CM

## 2022-10-27 ENCOUNTER — Ambulatory Visit: Admission: RE | Admit: 2022-10-27 | Payer: Medicare Other | Source: Ambulatory Visit

## 2022-10-27 ENCOUNTER — Other Ambulatory Visit: Payer: Self-pay | Admitting: Internal Medicine

## 2022-10-27 DIAGNOSIS — R921 Mammographic calcification found on diagnostic imaging of breast: Secondary | ICD-10-CM

## 2022-10-27 DIAGNOSIS — R928 Other abnormal and inconclusive findings on diagnostic imaging of breast: Secondary | ICD-10-CM

## 2022-10-27 HISTORY — PX: BREAST BIOPSY: SHX20

## 2022-10-29 ENCOUNTER — Telehealth: Payer: Self-pay | Admitting: Hematology

## 2022-10-29 NOTE — Telephone Encounter (Signed)
Spoke to patient to confirm upcoming morning [facility] clinic appointment on 8/7, paperwork will be sent via mail.   Gave location and time, also informed patient that the surgeon's office would be calling as well to get information from them similar to the packet that they will be receiving so make sure to do both.  Reminded patient that all providers will be coming to the clinic to see them HERE and if they had any questions to not hesitate to reach back out to myself or their navigators.

## 2022-11-01 ENCOUNTER — Encounter: Payer: Self-pay | Admitting: *Deleted

## 2022-11-01 DIAGNOSIS — D0511 Intraductal carcinoma in situ of right breast: Secondary | ICD-10-CM

## 2022-11-01 DIAGNOSIS — C50411 Malignant neoplasm of upper-outer quadrant of right female breast: Secondary | ICD-10-CM | POA: Insufficient documentation

## 2022-11-01 NOTE — Progress Notes (Addendum)
Radiation Oncology         [phone] ________________________________  Name: [name]        MRN: [id number]  Date of Service: 11/03/2022 DOB: 05-10-1955  XB:[id number], Adelfa Koh, MD  Emelia Loron, MD     REFERRING PHYSICIAN: Emelia Loron, MD   DIAGNOSIS: The encounter diagnosis was Ductal carcinoma in situ (DCIS) of right breast.   HISTORY OF PRESENT ILLNESS: [name] is a 67 y.o. female seen in the multidisciplinary breast clinic for a new diagnosis of right breast cancer. The patient was noted to have screening detected calcifications in the right breast. Further diagnostic mammogram showed a 1.7 cm group of pleomorphic calcifications in the UOQ. A biopsy on 10/27/22 showed high grade DCIS with apocrine features and focal necrosis. There was cancerization of lobules and adenosis with focal atypical hyperplasia and microcalcifications. Her cancer was ER positive, PR <1%, negative. She's seen today to discuss treatment recommendations of her cancer.    PREVIOUS RADIATION THERAPY: {EXAM; YES/NO:19492::"No"}   PAST MEDICAL HISTORY:  Past Medical History:  Diagnosis Date   Anemia    Arthritis    Atypical chest pain    a. 05/2018 CTA chest: No PE; b. 05/2018 MV: No ischemia/scar. EF 65%.   Coronary artery calcification seen on CT scan    a. 05/2018 CT Chest: Coronary and Ao atherosclerotic Calcifications.   Diabetes mellitus without complication (HCC)    Diastolic dysfunction    a. 05/2018 Echo: EF 60-65%, impaired relaxation. Nl RVSP.   Dysrhythmia    PVC's   Family history of adverse reaction to anesthesia    sister-malignant hyperthermia   FH of Malignant hyperthermia    a. sister had malginant hyperthermia   History of renal insufficiency    History of skin cancer    Hyperlipemia    Osteoporosis    Pulmonary nodules    a. 05/2018 CT Chest: Small pulm nodules measuring up to 6mm avg diameter. Rec non-contrast Chest CT in 6-12 mos.   Thyroid nodule         PAST SURGICAL HISTORY: Past Surgical History:  Procedure Laterality Date   BREAST BIOPSY Right 10/27/2022   MM RT BREAST BX W LOC DEV 1ST LESION IMAGE BX SPEC STEREO GUIDE 10/27/2022 [facility]   CESAREAN SECTION     CHOLECYSTECTOMY  2006   lap choli with umb hernia   COLONOSCOPY     LIPOMA EXCISION Left 03/15/2014   Procedure: EXCISION LIPOMA LEFT LOWER QUARDRANT ABDOMINAL WALL;  Surgeon: Violeta Gelinas, MD;  Location: [facility];  Service: General;  Laterality: Left;   THYROIDECTOMY N/A 06/25/2019   Procedure: TOTAL THYROIDECTOMY;  Surgeon: Darnell Level, MD;  Location: [facility];  Service: General;  Laterality: N/A;   TONSILLECTOMY     TRIGGER FINGER RELEASE  2012   lt small,middle,long   TRIGGER FINGER RELEASE Right 07/14/2017   Procedure: RELEASE TRIGGER FINGER/A-1 PULLEY RIGHT THUMB;  Surgeon: Cindee Salt, MD;  Location: [facility];  Service: Orthopedics;  Laterality: Right;   UMBILICAL HERNIA REPAIR  2006   with lap choli     FAMILY HISTORY:  Family History  Problem Relation Age of Onset   Heart disease Mother    Diabetes Father    Breast cancer Sister 34       Died at age 63   Breast cancer Sister 59 - 9       Died at age 49   Heart disease Other  SOCIAL HISTORY:  reports that she has never smoked. She has never used smokeless tobacco. She reports that she does not drink alcohol and does not use drugs. The patient is married and lives in [place]. She ***.    ALLERGIES: Patient has no known allergies.   MEDICATIONS:  Current Outpatient Medications  Medication Sig Dispense Refill   alendronate (FOSAMAX) 70 MG tablet Take 70 mg by mouth every Sunday. Take with a full glass of water on an empty stomach.  (Patient not taking: Reported on 03/08/2022)     aspirin EC 81 MG tablet Take 81 mg by mouth daily.     Calcium Carb-Cholecalciferol (CALCIUM 600 + D PO) Take 1 tablet by mouth daily. (Patient not taking: Reported on  03/08/2022)     ciprofloxacin (CIPRO) 250 MG tablet Take 250 mg by mouth See admin instructions. Take 1 tablet (250 mg) by mouth daily as needed after sexual intercourse.     Cyanocobalamin (VITAMIN B-12 PO) Take by mouth. Takes every 31 days.(Inj.)     Insulin Glargine (BASAGLAR KWIKPEN) 100 UNIT/ML SOPN Inject 35 Units into the skin at bedtime.     insulin lispro (HUMALOG) 100 UNIT/ML injection Inject 0-15 Units into the skin 3 (three) times daily before meals. Sliding scale     levothyroxine (SYNTHROID) 112 MCG tablet Take 112 mcg by mouth daily before breakfast.     metFORMIN (GLUCOPHAGE) 1000 MG tablet Take 1,000 mg by mouth 2 (two) times daily with a meal.     metoprolol succinate (TOPROL-XL) 25 MG 24 hr tablet Take 1 tablet (25 mg total) by mouth daily. 90 tablet 2   Nutritional Supplements (JUICE PLUS FIBRE PO) Take 6 tablets by mouth in the morning and at bedtime. Fruit Blend (2) + Vegetable Blend (2) + Berry Blend twice daily     ramipril (ALTACE) 5 MG capsule Take 5 mg by mouth daily.      rosuvastatin (CRESTOR) 20 MG tablet Take 20 mg by mouth daily with supper.      No current facility-administered medications for this visit.     REVIEW OF SYSTEMS: On review of systems, the patient reports that she is doing ***     PHYSICAL EXAM:  Wt Readings from Last 3 Encounters:  03/08/22 180 lb 3.2 oz (81.7 kg)  10/09/20 181 lb 9.6 oz (82.4 kg)  09/27/19 182 lb (82.6 kg)   Temp Readings from Last 3 Encounters:  06/26/19 98.9 F (37.2 C) (Oral)  06/19/19 98 F (36.7 C) (Oral)  06/16/18 98 F (36.7 C) (Oral)   BP Readings from Last 3 Encounters:  03/08/22 138/78  10/09/20 120/80  09/27/19 (!) 144/70   Pulse Readings from Last 3 Encounters:  03/08/22 100  10/09/20 83  09/27/19 80    In general this is a well appearing caucasian female in no acute distress. She's alert and oriented x4 and appropriate throughout the examination. Cardiopulmonary assessment is negative for  acute distress and she exhibits normal effort. Bilateral breast exam is deferred.    ECOG = ***  0 - Asymptomatic (Fully active, able to carry on all predisease activities without restriction)  1 - Symptomatic but completely ambulatory (Restricted in physically strenuous activity but ambulatory and able to carry out work of a light or sedentary nature. For example, light housework, office work)  2 - Symptomatic, <50% in bed during the day (Ambulatory and capable of all self care but unable to carry out any work activities. Up and about more  than 50% of waking hours)  3 - Symptomatic, >50% in bed, but not bedbound (Capable of only limited self-care, confined to bed or chair 50% or more of waking hours)  4 - Bedbound (Completely disabled. Cannot carry on any self-care. Totally confined to bed or chair)  5 - Death   Santiago Glad MM, Creech RH, Tormey DC, et al. [id number]). "Toxicity and response criteria of the [facility]". Am. Evlyn Clines. Oncol. 5 (6): 649-55    LABORATORY DATA:  Lab Results  Component Value Date   WBC 7.8 06/19/2019   HGB 14.2 06/19/2019   HCT 42.8 06/19/2019   MCV 88.4 06/19/2019   PLT 370 06/19/2019   Lab Results  Component Value Date   NA 133 (L) 06/26/2019   K 3.9 06/26/2019   CL 97 (L) 06/26/2019   CO2 24 06/26/2019   Lab Results  Component Value Date   ALT 19 06/15/2018   AST 18 06/15/2018   ALKPHOS 54 06/15/2018   BILITOT 0.5 06/15/2018      RADIOGRAPHY: MM RT BREAST BX W LOC DEV 1ST LESION IMAGE BX SPEC STEREO GUIDE  Addendum Date: 10/29/2022   ADDENDUM REPORT: 10/29/2022 08:13 ADDENDUM: Pathology revealed HIGH-GRADE DUCTAL CARCINOMA IN SITU, SOLID TYPE WITH APOCRINE FEATURES AND FOCAL NECROSIS SHOWING CANCERIZATION OF LOBULES AND ADENOSIS NEGATIVE FOR INVASIVE CARCINOMA, FOCAL ATYPICAL LOBULAR HYPERPLASIA (ALH), MICROCALCIFICATIONS PRESENT WITHIN DCIS AND BENIGN ADENOSIS AND STROMA, BACKGROUND FIBROCYSTIC CHANGES INCLUDING STROMAL  FIBROSIS AND EXTENSIVE ADENOSIS AND SCLEROSING ADENOSIS of the RIGHT breast, upper-outer quadrant, (x clip). This was found to be concordant by Dr. Baird Lyons. Pathology results were discussed with the patient by telephone. The patient reported doing well after the biopsy with tenderness and bleeding at the site. Post biopsy instructions and care were reviewed and questions were answered. The patient was encouraged to call [facility] for any additional concerns. My direct phone number was provided. The patient was referred to [facility] on November 03, 2022. Consideration for a bilateral breast MRI for further evaluation of extent of disease given the high grade histology, breast density and family history. Pathology results reported by Rene Kocher, RN on 10/28/2022. Electronically Signed   By: Baird Lyons M.D.   On: 10/29/2022 08:13   Result Date: 10/29/2022 CLINICAL DATA:  Right breast calcifications EXAM: RIGHT BREAST STEREOTACTIC CORE NEEDLE BIOPSY COMPARISON:  Previous exam(s). FINDINGS: The patient and I discussed the procedure of stereotactic-guided biopsy including benefits and alternatives. We discussed the high likelihood of a successful procedure. We discussed the risks of the procedure including infection, bleeding, tissue injury, clip migration, and inadequate sampling. Informed written consent was given. The usual time out protocol was performed immediately prior to the procedure. Using sterile technique and 1% lidocaine and 1% lidocaine with epinephrine as local anesthetic, under stereotactic guidance, a 9 gauge vacuum assisted device was used to perform core needle biopsy of calcifications in the upper-outer quadrant of the right breast using a superior to inferior approach. Specimen radiograph was performed showing calcifications are present in the tissue samples. Specimens with calcifications are  identified for pathology. Lesion quadrant: Upper-outer quadrant At the conclusion of the procedure, X shaped tissue marker clip was deployed into the biopsy cavity. Follow-up 2-view mammogram was performed and dictated separately. IMPRESSION: Stereotactic-guided biopsy of the right breast. No apparent complications. Electronically Signed: By: Baird Lyons M.D. On: 10/27/2022 12:13   MM CLIP PLACEMENT  RIGHT  Result Date: 10/27/2022 CLINICAL DATA:  Status post stereotactic biopsy of right breast calcifications. EXAM: 3D DIAGNOSTIC RIGHT MAMMOGRAM POST STEREOTACTIC BIOPSY COMPARISON:  Previous exam(s). FINDINGS: 3D Mammographic images were obtained following stereotactic guided biopsy of the right breast. The biopsy marking clip is upper-outer quadrant of the breast. There are residual calcifications posterosuperior to the X shaped biopsy marker clip. IMPRESSION: Appropriate positioning of the X shaped biopsy marking clip at the site of biopsy in the upper-outer quadrant of the right breast. Final Assessment: Post Procedure Mammograms for Marker Placement Electronically Signed   By: Baird Lyons M.D.   On: 10/27/2022 12:13   MM Digital Diagnostic Unilat R  Result Date: 10/21/2022 CLINICAL DATA:  Screening recall for right breast calcifications. The patient has family history of breast cancer in 2 of her sisters. EXAM: DIGITAL DIAGNOSTIC UNILATERAL RIGHT MAMMOGRAM TECHNIQUE: Right digital diagnostic mammography was performed. COMPARISON:  Previous exam(s). ACR Breast Density Category c: The breasts are heterogeneously dense, which may obscure small masses. FINDINGS: There is a 1.7 cm group of pleomorphic calcifications in the upper-outer quadrant of the right breast. IMPRESSION: 1.7 cm group of indeterminate calcifications in the upper-outer right breast. RECOMMENDATION: 1. Stereotactic biopsy is recommended for the right breast calcifications. We will schedule the patient for the procedure at her earliest  convenience. 2. Consider genetics assessment to determine the patient's lifetime risk of breast cancer given her family history, if this has not already been performed. Per American Cancer Society guidelines, if the patient has a calculated lifetime risk of developing breast cancer of greater than 20%, annual screening MRI of the breasts would be recommended at the time of screening mammography. I have discussed the findings and recommendations with the patient. If applicable, a reminder letter will be sent to the patient regarding the next appointment. BI-RADS CATEGORY  4: Suspicious. Electronically Signed   By: Frederico Hamman M.D.   On: 10/21/2022 13:47   MM 3D SCREENING MAMMOGRAM BILATERAL BREAST  Result Date: 10/15/2022 CLINICAL DATA:  Screening. EXAM: DIGITAL SCREENING BILATERAL MAMMOGRAM WITH TOMOSYNTHESIS AND CAD TECHNIQUE: Bilateral screening digital craniocaudal and mediolateral oblique mammograms were obtained. Bilateral screening digital breast tomosynthesis was performed. The images were evaluated with computer-aided detection. COMPARISON:  Previous exam(s). ACR Breast Density Category b: There are scattered areas of fibroglandular density. FINDINGS: In the right breast, calcifications warrant further evaluation with magnified views. In the left breast, no findings suspicious for malignancy. IMPRESSION: Further evaluation is suggested for calcifications in the right breast. RECOMMENDATION: Diagnostic mammogram of the right breast. (Code:[id number]) The patient will be contacted regarding the findings, and additional imaging will be scheduled. BI-RADS CATEGORY  0: Incomplete: Need additional imaging evaluation. Electronically Signed   By: Ted Mcalpine M.D.   On: 10/15/2022 16:05       IMPRESSION/PLAN: 1. High Grade ER positive invasive ductal carcinoma of the right breast. Dr. Mitzi Hansen discusses the pathology findings and reviews the nature of noninvasive breast disease. The consensus from  the breast conference includes breast conservation with lumpectomy. Dr. Mitzi Hansen recommends external radiotherapy to the breast  to reduce risks of local recurrence followed by antiestrogen therapy. We discussed the risks, benefits, short, and long term effects of radiotherapy, as well as the curative intent, and the patient is interested in proceeding. Dr. Mitzi Hansen discusses the delivery and logistics of radiotherapy and anticipates a course of 4 weeks of radiotherapy to the right breast. We will see her back a few weeks after surgery to discuss the simulation  process and anticipate we starting radiotherapy about 4-6 weeks after surgery.  2. Possible genetic predisposition to malignancy. The patient is a candidate for genetic testing given *** personal and family history. She will meet with our geneticist today in clinic.   In a visit lasting *** minutes, greater than 50% of the time was spent face to face reviewing her case, as well as in preparation of, discussing, and coordinating the patient's care.  The above documentation reflects my direct findings during this shared patient visit. Please see the separate note by Dr. Mitzi Hansen on this date for the remainder of the patient's plan of care.    Osker Mason, [facility]    **Disclaimer: This note was dictated with voice recognition software. Similar sounding words can inadvertently be transcribed and this note may contain transcription errors which may not have been corrected upon publication of note.**

## 2022-11-03 ENCOUNTER — Other Ambulatory Visit: Payer: Self-pay | Admitting: General Surgery

## 2022-11-03 ENCOUNTER — Encounter: Payer: Self-pay | Admitting: Hematology

## 2022-11-03 ENCOUNTER — Inpatient Hospital Stay: Payer: Medicare Other | Attending: Hematology

## 2022-11-03 ENCOUNTER — Ambulatory Visit
Admission: RE | Admit: 2022-11-03 | Discharge: 2022-11-03 | Disposition: A | Discharge status: Home / Self Care | Payer: Medicare Other | Source: Ambulatory Visit | Attending: Radiation Oncology | Admitting: Radiation Oncology

## 2022-11-03 ENCOUNTER — Inpatient Hospital Stay (HOSPITAL_BASED_OUTPATIENT_CLINIC_OR_DEPARTMENT_OTHER): Payer: Medicare Other | Admitting: Hematology

## 2022-11-03 ENCOUNTER — Inpatient Hospital Stay (HOSPITAL_BASED_OUTPATIENT_CLINIC_OR_DEPARTMENT_OTHER): Payer: Medicare Other | Admitting: Genetic Counselor

## 2022-11-03 ENCOUNTER — Encounter: Payer: Self-pay | Admitting: *Deleted

## 2022-11-03 ENCOUNTER — Encounter: Payer: Self-pay | Admitting: Genetic Counselor

## 2022-11-03 ENCOUNTER — Other Ambulatory Visit: Payer: Self-pay

## 2022-11-03 VITALS — BP 155/86 | HR 82 | Temp 98.1°F | Resp 17 | Wt 176.9 lb

## 2022-11-03 DIAGNOSIS — Z79899 Other long term (current) drug therapy: Secondary | ICD-10-CM | POA: Insufficient documentation

## 2022-11-03 DIAGNOSIS — D0511 Intraductal carcinoma in situ of right breast: Secondary | ICD-10-CM

## 2022-11-03 DIAGNOSIS — M810 Age-related osteoporosis without current pathological fracture: Secondary | ICD-10-CM | POA: Diagnosis not present

## 2022-11-03 DIAGNOSIS — E119 Type 2 diabetes mellitus without complications: Secondary | ICD-10-CM | POA: Diagnosis not present

## 2022-11-03 DIAGNOSIS — Z803 Family history of malignant neoplasm of breast: Secondary | ICD-10-CM | POA: Insufficient documentation

## 2022-11-03 DIAGNOSIS — Z808 Family history of malignant neoplasm of other organs or systems: Secondary | ICD-10-CM

## 2022-11-03 LAB — CMP (CANCER CENTER ONLY)
ALT: 14 U/L (ref 0–44)
AST: 11 U/L — ABNORMAL LOW (ref 15–41)
Albumin: 4.4 g/dL (ref 3.5–5.0)
Alkaline Phosphatase: 68 U/L (ref 38–126)
Anion gap: 13 (ref 5–15)
BUN: 9 mg/dL (ref 8–23)
CO2: 25 mmol/L (ref 22–32)
Calcium: 9.4 mg/dL (ref 8.9–10.3)
Chloride: 99 mmol/L (ref 98–111)
Creatinine: 0.61 mg/dL (ref 0.44–1.00)
GFR, Estimated: >60 mL/min (ref >60)
Glucose, Bld: 123 mg/dL — ABNORMAL HIGH (ref 70–99)
Potassium: 3.4 mmol/L — ABNORMAL LOW (ref 3.5–5.1)
Sodium: 137 mmol/L (ref 135–145)
Total Bilirubin: 0.5 mg/dL (ref 0.3–1.2)
Total Protein: 7.4 g/dL (ref 6.5–8.1)

## 2022-11-03 LAB — CBC WITH DIFFERENTIAL (CANCER CENTER ONLY)
Abs Immature Granulocytes: 0.03 10*3/uL (ref 0.00–0.07)
Basophils Absolute: 0.1 10*3/uL (ref 0.0–0.1)
Basophils Relative: 2 %
Eosinophils Absolute: 0.2 10*3/uL (ref 0.0–0.5)
Eosinophils Relative: 4 %
HCT: 41.7 % (ref 36.0–46.0)
Hemoglobin: 14.2 g/dL (ref 12.0–15.0)
Immature Granulocytes: 1 %
Lymphocytes Relative: 27 %
Lymphs Abs: 1.5 10*3/uL (ref 0.7–4.0)
MCH: 30.2 pg (ref 26.0–34.0)
MCHC: 34.1 g/dL (ref 30.0–36.0)
MCV: 88.7 fL (ref 80.0–100.0)
Monocytes Absolute: 0.6 10*3/uL (ref 0.1–1.0)
Monocytes Relative: 12 %
Neutro Abs: 3 10*3/uL (ref 1.7–7.7)
Neutrophils Relative %: 54 %
Platelet Count: 299 10*3/uL (ref 150–400)
RBC: 4.7 MIL/uL (ref 3.87–5.11)
RDW: 13.1 % (ref 11.5–15.5)
WBC Count: 5.4 10*3/uL (ref 4.0–10.5)
nRBC: 0 % (ref 0.0–0.2)

## 2022-11-03 LAB — GENETIC SCREENING ORDER

## 2022-11-03 NOTE — Progress Notes (Signed)
REFERRING PROVIDER: Malachy Mood, MD [address] [place],  [place] [address]  PRIMARY PROVIDER:  Tisovec, Adelfa Koh, MD  PRIMARY REASON FOR VISIT:  1. Ductal carcinoma in situ (DCIS) of right breast   2. Family history of breast cancer      HISTORY OF PRESENT ILLNESS:   [name], a 67 y.o. female, was seen for a [facility] cancer genetics consultation at the request of Dr. Mosetta Putt due to a personal and family history of breast cancer.  [name] presents to clinic today to discuss the possibility of a hereditary predisposition to cancer, to discuss genetic testing, and to further clarify her future cancer risks, as well as potential cancer risks for family members.   In July 2024, at the age of 32, [name] was diagnosed with ductal carcinoma in situ of the right breast (ER+/PR-). The preliminary treatment plan includes breast conserving surgery, adjuvant radiation, and anti-estrogens.   CANCER HISTORY:  Oncology History  Ductal carcinoma in situ (DCIS) of right breast  10/27/2022 Cancer Staging   Staging form: Breast, AJCC 8th Edition - Clinical stage from 10/27/2022: Stage 0 (cTis (DCIS), cN0, cM0, G3, ER+, PR-, HER2: Not Assessed) - Signed by Malachy Mood, MD on 11/03/2022 Stage prefix: Initial diagnosis Histologic grading system: 3 grade system   11/01/2022 Initial Diagnosis   Ductal carcinoma in situ (DCIS) of right breast      Past Medical History:  Diagnosis Date   Anemia    Arthritis    Atypical chest pain    a. 05/2018 CTA chest: No PE; b. 05/2018 MV: No ischemia/scar. EF 65%.   Breast cancer [facility])    Coronary artery calcification seen on CT scan    a. 05/2018 CT Chest: Coronary and Ao atherosclerotic Calcifications.   Diabetes mellitus without complication (HCC)    Diastolic dysfunction    a. 05/2018 Echo: EF 60-65%, impaired relaxation. Nl RVSP.   Dysrhythmia    PVC's   Family history of adverse reaction to anesthesia    sister-malignant hyperthermia   FH of  Malignant hyperthermia    a. sister had malginant hyperthermia   History of renal insufficiency    History of skin cancer    Hyperlipemia    Osteoporosis    Pulmonary nodules    a. 05/2018 CT Chest: Small pulm nodules measuring up to 6mm avg diameter. Rec non-contrast Chest CT in 6-12 mos.   Thyroid nodule     Past Surgical History:  Procedure Laterality Date   BREAST BIOPSY Right 10/27/2022   MM RT BREAST BX W LOC DEV 1ST LESION IMAGE BX SPEC STEREO GUIDE 10/27/2022 [facility]MO[facility]   CESAREAN SECTION     CHOLECYSTECTOMY  2006   lap choli with umb hernia   COLONOSCOPY     LIPOMA EXCISION Left 03/15/2014   Procedure: EXCISION LIPOMA LEFT LOWER QUARDRANT ABDOMINAL WALL;  Surgeon: Violeta Gelinas, MD;  Location: [facility];  Service: General;  Laterality: Left;   THYROIDECTOMY N/A 06/25/2019   Procedure: TOTAL THYROIDECTOMY;  Surgeon: Darnell Level, MD;  Location: [facility];  Service: General;  Laterality: N/A;   TONSILLECTOMY     TRIGGER FINGER RELEASE  2012   lt small,middle,long   TRIGGER FINGER RELEASE Right 07/14/2017   Procedure: RELEASE TRIGGER FINGER/A-1 PULLEY RIGHT THUMB;  Surgeon: Cindee Salt, MD;  Location: [facility];  Service: Orthopedics;  Laterality: Right;   UMBILICAL HERNIA REPAIR  2006   with lap choli    FAMILY  HISTORY:  We obtained a detailed, 4-generation family history.  Significant diagnoses are listed below: Family History  Problem Relation Age of Onset   Throat cancer Mother    Breast cancer Sister 40       d. 69; mets   Breast cancer Sister 36       d. 55s; mets     [name] is unaware of previous family history of genetic testing for hereditary cancer risks. There is no reported Ashkenazi Jewish ancestry. There is no known consanguinity.  GENETIC COUNSELING ASSESSMENT: [name] is a 67 y.o. female with a personal and family history which is somewhat suggestive of a hereditary cancer syndrome and predisposition to  cancer given the presence of multiple breast cancer diagnoses in the family. We, therefore, discussed and recommended the following at today's visit.   DISCUSSION: We discussed that 5 - 10% of cancer is hereditary.  Most cases of hereditary breast cancer is associated with mutations in BRCA1/2.  There are other genes that can be associated with hereditary breast cancer syndromes.  We discussed that testing is beneficial for several reasons including knowing how to follow individuals for their cancer risks, identifying whether potential treatment/surgery options would be beneficial, and understanding if other family members could be at risk for cancer and allowing them to undergo genetic testing.   We reviewed the characteristics, features and inheritance patterns of hereditary cancer syndromes. We also discussed genetic testing, including the appropriate family members to test, the process of testing, insurance coverage and turn-around-time for results. We discussed the implications of a negative, positive, carrier and/or variant of uncertain significant result. We recommended [name] pursue genetic testing for a panel that includes genes associated with breast cancer and other cancers.    The [facility] Common Hereditary Cancers + RNA Panel includes sequencing, deletion/duplication, and RNA analysis of the following 48 genes: APC, ATM, AXIN2, BAP1, BARD1, BMPR1A, BRCA1, BRCA2, BRIP1, CDH1, CDK4*, CDKN2A*, CHEK2, CTNNA1, DICER1, EPCAM* (del/dup only), FH, GREM1* (promoter dup analysis only), HOXB13*, KIT*, MBD4*, MEN1, MLH1, MSH2, MSH3, MSH6, MUTYH, NF1, NTHL1, PALB2, PDGFRA*, PMS2, POLD1, POLE, PTEN, RAD51C, RAD51D, SDHA (sequencing only), SDHB, SDHC, SDHD, SMAD4, SMARCA4, STK11, TP53, TSC1, TSC2, VHL.  *Genes without RNA analysis.   Based on [name]'s personal and family history of breast cancer, she meets medical criteria for genetic testing. Despite that she meets criteria, she may still have an out  of pocket cost. We discussed that if her out of pocket cost for testing is over $100, the laboratory should contact her and discuss the self-pay prices and/or patient pay assistance programs.    PLAN: After considering the risks, benefits, and limitations, [name] provided informed consent to pursue genetic testing and the blood sample was sent to [facility] for analysis of the Common Hereditary Cancers +RNA Panel. Results should be available within approximately 3 weeks' time, at which point they will be disclosed by telephone to [name], as will any additional recommendations warranted by these results. [name] will receive a summary of her genetic counseling visit and a copy of her results once available. This information will also be available in Epic.    [name] questions were answered to her satisfaction today. Our contact information was provided should additional questions or concerns arise. Thank you for the referral and allowing [place] to share in the care of your patient.    M. Rennie Plowman, MS, [facility] Genetic Counselor [email or web address] (P) [phone]  The patient was seen for a  total of 16 minutes in face-to-face genetic counseling.  The patient was accompanied by her daughter and husband.  Dr. Mosetta Putt was available to discuss this case as needed.    _______________________________________________________________________ For Office Staff:  Number of people involved in session: 3 Was an Intern/ student involved with case: no

## 2022-11-03 NOTE — Progress Notes (Signed)
[facility]   Telephone:([phone]) [phone] Fax:([phone]) [phone]   Clinic New Consult Note   Patient Care Team: Tisovec, Adelfa Koh, MD as PCP - General (Internal Medicine) Iran Ouch, MD as PCP - Cardiology (Cardiology) Malachy Mood, MD as Consulting Physician (Hematology) Emelia Loron, MD as Consulting Physician (General Surgery) Dorothy Puffer, MD as Consulting Physician (Radiation Oncology) Pershing Proud, RN as Oncology Nurse Navigator Donnelly Angelica, RN as Oncology Nurse Navigator 11/03/2022  CHIEF COMPLAINTS/PURPOSE OF CONSULTATION:  Newly diagnosed right breast DCIS   HISTORY OF PRESENTING ILLNESS:  [name] 67 y.o. female is here because of newly diagnosed right breast DCIS.  She was referred by breast center.  She is accompanied by her husband and daughter.  This was discovered on her screening mammogram.  She has strong family history of breast cancer, both of 2 sisters died from breast cancer.  Her screening mammogram on October 15, 2022 showed calcifications in the right breast, so she underwent a diagnostic mammogram on October 21, 2022, which showed 1.7 cm group of indeterminate calcification in the upper outer right breast.  Biopsy confirmed high-grade DCIS, along with ALH.  ER 80% strongly positive, PR negative.  She has multiple medical comorbidities, longstanding history of diabetes but it is very well-controlled.  She lives with her husband, remain active.  She denies any significant pain or other symptoms, recent weight loss or other concerns.  She had 2 pregnancies, no history of hormonal replacement.  She has a history of osteoporosis, was on alendronate for 5 years, and came off a few years ago.  MEDICAL HISTORY:  Past Medical History:  Diagnosis Date   Anemia    Arthritis    Atypical chest pain    a. 05/2018 CTA chest: No PE; b. 05/2018 MV: No ischemia/scar. EF 65%.   Breast cancer [facility])    Coronary artery calcification seen on CT scan    a.  05/2018 CT Chest: Coronary and Ao atherosclerotic Calcifications.   Diabetes mellitus without complication (HCC)    Diastolic dysfunction    a. 05/2018 Echo: EF 60-65%, impaired relaxation. Nl RVSP.   Dysrhythmia    PVC's   Family history of adverse reaction to anesthesia    sister-malignant hyperthermia   FH of Malignant hyperthermia    a. sister had malginant hyperthermia   History of renal insufficiency    History of skin cancer    Hyperlipemia    Osteoporosis    Pulmonary nodules    a. 05/2018 CT Chest: Small pulm nodules measuring up to 6mm avg diameter. Rec non-contrast Chest CT in 6-12 mos.   Thyroid nodule     SURGICAL HISTORY: Past Surgical History:  Procedure Laterality Date   BREAST BIOPSY Right 10/27/2022   MM RT BREAST BX W LOC DEV 1ST LESION IMAGE BX SPEC STEREO GUIDE 10/27/2022 [facility]   CESAREAN SECTION     CHOLECYSTECTOMY  2006   lap choli with umb hernia   COLONOSCOPY     LIPOMA EXCISION Left 03/15/2014   Procedure: EXCISION LIPOMA LEFT LOWER QUARDRANT ABDOMINAL WALL;  Surgeon: Violeta Gelinas, MD;  Location: [facility];  Service: General;  Laterality: Left;   THYROIDECTOMY N/A 06/25/2019   Procedure: TOTAL THYROIDECTOMY;  Surgeon: Darnell Level, MD;  Location: [facility];  Service: General;  Laterality: N/A;   TONSILLECTOMY     TRIGGER FINGER RELEASE  2012   lt small,middle,long   TRIGGER FINGER RELEASE Right 07/14/2017   Procedure: RELEASE  TRIGGER FINGER/A-1 PULLEY RIGHT THUMB;  Surgeon: Cindee Salt, MD;  Location: [facility];  Service: Orthopedics;  Laterality: Right;   UMBILICAL HERNIA REPAIR  2006   with lap choli    SOCIAL HISTORY: Social History   Socioeconomic History   Marital status: Married    Spouse name: Not on file   Number of children: 2   Years of education: Not on file   Highest education level: Not on file  Occupational History   Not on file  Tobacco Use   Smoking status: Never   Smokeless tobacco:  Never  Vaping Use   Vaping status: Not on file  Substance and Sexual Activity   Alcohol use: No   Drug use: No   Sexual activity: Not on file  Other Topics Concern   Not on file  Social History Narrative   Not on file   Social Determinants of Health   Financial Resource Strain: Not on file  Food Insecurity: Not on file  Transportation Needs: Not on file  Physical Activity: Not on file  Stress: Not on file  Social Connections: Not on file  Intimate Partner Violence: Not on file    FAMILY HISTORY: Family History  Problem Relation Age of Onset   Heart disease Mother    Throat cancer Mother    Diabetes Father    Breast cancer Sister 40       d. 72; mets   Breast cancer Sister 62       d. 39s; mets   Heart disease Other     ALLERGIES:  has No Known Allergies.  MEDICATIONS:  Current Outpatient Medications  Medication Sig Dispense Refill   aspirin EC 81 MG tablet Take 81 mg by mouth daily.     Calcium Carb-Cholecalciferol (CALCIUM 600 + D PO) Take 1 tablet by mouth daily. (Patient not taking: Reported on 03/08/2022)     ciprofloxacin (CIPRO) 250 MG tablet Take 250 mg by mouth See admin instructions. Take 1 tablet (250 mg) by mouth daily as needed after sexual intercourse.     Cyanocobalamin (VITAMIN B-12 PO) Take by mouth. Takes every 31 days.(Inj.)     Insulin Glargine (BASAGLAR KWIKPEN) 100 UNIT/ML SOPN Inject 35 Units into the skin at bedtime.     insulin lispro (HUMALOG) 100 UNIT/ML injection Inject 0-15 Units into the skin 3 (three) times daily before meals. Sliding scale     levothyroxine (SYNTHROID) 112 MCG tablet Take 112 mcg by mouth daily before breakfast.     metFORMIN (GLUCOPHAGE) 1000 MG tablet Take 1,000 mg by mouth 2 (two) times daily with a meal.     metoprolol succinate (TOPROL-XL) 25 MG 24 hr tablet Take 1 tablet (25 mg total) by mouth daily. 90 tablet 2   Nutritional Supplements (JUICE PLUS FIBRE PO) Take 6 tablets by mouth in the morning and at bedtime.  Fruit Blend (2) + Vegetable Blend (2) + Berry Blend twice daily     ramipril (ALTACE) 5 MG capsule Take 5 mg by mouth daily.      rosuvastatin (CRESTOR) 20 MG tablet Take 20 mg by mouth daily with supper.      No current facility-administered medications for this visit.    REVIEW OF SYSTEMS:   Constitutional: Denies fevers, chills or abnormal night sweats Eyes: Denies blurriness of vision, double vision or watery eyes Ears, nose, mouth, throat, and face: Denies mucositis or sore throat Respiratory: Denies cough, dyspnea or wheezes Cardiovascular: Denies palpitation, chest discomfort or  lower extremity swelling Gastrointestinal:  Denies nausea, heartburn or change in bowel habits Skin: Denies abnormal skin rashes Lymphatics: Denies new lymphadenopathy or easy bruising Neurological:Denies numbness, tingling or new weaknesses Behavioral/Psych: Mood is stable, no new changes  All other systems were reviewed with the patient and are negative.  PHYSICAL EXAMINATION: ECOG PERFORMANCE STATUS: 0 - Asymptomatic  Vitals:   11/03/22 0907  BP: (!) 155/86  Pulse: 82  Resp: 17  Temp: 98.1 F (36.7 C)  SpO2: 100%   Filed Weights   11/03/22 0907  Weight: 176 lb 14.4 oz (80.2 kg)    GENERAL:alert, no distress and comfortable SKIN: skin color, texture, turgor are normal, no rashes or significant lesions EYES: normal, conjunctiva are pink and non-injected, sclera clear OROPHARYNX:no exudate, no erythema and lips, buccal mucosa, and tongue normal  NECK: supple, thyroid normal size, non-tender, without nodularity LYMPH:  no palpable lymphadenopathy in the cervical, axillary or inguinal LUNGS: clear to auscultation and percussion with normal breathing effort HEART: regular rate & rhythm and no murmurs and no lower extremity edema ABDOMEN:abdomen soft, non-tender and normal bowel sounds Musculoskeletal:no cyanosis of digits and no clubbing  PSYCH: alert & oriented x 3 with fluent  speech NEURO: no focal motor/sensory deficits  LABORATORY DATA:  I have reviewed the data as listed    Latest Ref Rng & Units 11/03/2022    8:17 AM 06/19/2019    3:50 PM 06/16/2018    4:41 AM  CBC  WBC 4.0 - 10.5 K/uL 5.4  7.8  5.7   Hemoglobin 12.0 - 15.0 g/dL 09.8  11.9  14.7   Hematocrit 36.0 - 46.0 % 41.7  42.8  42.3   Platelets 150 - 400 K/uL 299  370  323        Latest Ref Rng & Units 11/03/2022    8:17 AM 06/26/2019    5:00 AM 06/19/2019    3:50 PM  CMP  Glucose 70 - 99 mg/dL 829  562  130   BUN 8 - 23 mg/dL 9  12  12    Creatinine 0.44 - 1.00 mg/dL 8.65  7.84  6.96   Sodium 135 - 145 mmol/L 137  133  135   Potassium 3.5 - 5.1 mmol/L 3.4  3.9  3.8   Chloride 98 - 111 mmol/L 99  97  97   CO2 22 - 32 mmol/L 25  24  25    Calcium 8.9 - 10.3 mg/dL 9.4  9.1  9.8   Total Protein 6.5 - 8.1 g/dL 7.4     Total Bilirubin 0.3 - 1.2 mg/dL 0.5     Alkaline Phos 38 - 126 U/L 68     AST 15 - 41 U/L 11     ALT 0 - 44 U/L 14        RADIOGRAPHIC STUDIES: I have personally reviewed the radiological images as listed and agreed with the findings in the report. MM RT BREAST BX W LOC DEV 1ST LESION IMAGE BX SPEC STEREO GUIDE  Addendum Date: 10/29/2022   ADDENDUM REPORT: 10/29/2022 08:13 ADDENDUM: Pathology revealed HIGH-GRADE DUCTAL CARCINOMA IN SITU, SOLID TYPE WITH APOCRINE FEATURES AND FOCAL NECROSIS SHOWING CANCERIZATION OF LOBULES AND ADENOSIS NEGATIVE FOR INVASIVE CARCINOMA, FOCAL ATYPICAL LOBULAR HYPERPLASIA (ALH), MICROCALCIFICATIONS PRESENT WITHIN DCIS AND BENIGN ADENOSIS AND STROMA, BACKGROUND FIBROCYSTIC CHANGES INCLUDING STROMAL FIBROSIS AND EXTENSIVE ADENOSIS AND SCLEROSING ADENOSIS of the RIGHT breast, upper-outer quadrant, (x clip). This was found to be concordant by Dr. Baird Lyons. Pathology results were  discussed with the patient by telephone. The patient reported doing well after the biopsy with tenderness and bleeding at the site. Post biopsy instructions and care were reviewed and  questions were answered. The patient was encouraged to call [facility] for any additional concerns. My direct phone number was provided. The patient was referred to [facility] on November 03, 2022. Consideration for a bilateral breast MRI for further evaluation of extent of disease given the high grade histology, breast density and family history. Pathology results reported by Rene Kocher, RN on 10/28/2022. Electronically Signed   By: Baird Lyons M.D.   On: 10/29/2022 08:13   Result Date: 10/29/2022 CLINICAL DATA:  Right breast calcifications EXAM: RIGHT BREAST STEREOTACTIC CORE NEEDLE BIOPSY COMPARISON:  Previous exam(s). FINDINGS: The patient and I discussed the procedure of stereotactic-guided biopsy including benefits and alternatives. We discussed the high likelihood of a successful procedure. We discussed the risks of the procedure including infection, bleeding, tissue injury, clip migration, and inadequate sampling. Informed written consent was given. The usual time out protocol was performed immediately prior to the procedure. Using sterile technique and 1% lidocaine and 1% lidocaine with epinephrine as local anesthetic, under stereotactic guidance, a 9 gauge vacuum assisted device was used to perform core needle biopsy of calcifications in the upper-outer quadrant of the right breast using a superior to inferior approach. Specimen radiograph was performed showing calcifications are present in the tissue samples. Specimens with calcifications are identified for pathology. Lesion quadrant: Upper-outer quadrant At the conclusion of the procedure, X shaped tissue marker clip was deployed into the biopsy cavity. Follow-up 2-view mammogram was performed and dictated separately. IMPRESSION: Stereotactic-guided biopsy of the right breast. No apparent complications. Electronically Signed: By: Baird Lyons M.D. On:  10/27/2022 12:13   MM CLIP PLACEMENT RIGHT  Result Date: 10/27/2022 CLINICAL DATA:  Status post stereotactic biopsy of right breast calcifications. EXAM: 3D DIAGNOSTIC RIGHT MAMMOGRAM POST STEREOTACTIC BIOPSY COMPARISON:  Previous exam(s). FINDINGS: 3D Mammographic images were obtained following stereotactic guided biopsy of the right breast. The biopsy marking clip is upper-outer quadrant of the breast. There are residual calcifications posterosuperior to the X shaped biopsy marker clip. IMPRESSION: Appropriate positioning of the X shaped biopsy marking clip at the site of biopsy in the upper-outer quadrant of the right breast. Final Assessment: Post Procedure Mammograms for Marker Placement Electronically Signed   By: Baird Lyons M.D.   On: 10/27/2022 12:13   MM Digital Diagnostic Unilat R  Result Date: 10/21/2022 CLINICAL DATA:  Screening recall for right breast calcifications. The patient has family history of breast cancer in 2 of her sisters. EXAM: DIGITAL DIAGNOSTIC UNILATERAL RIGHT MAMMOGRAM TECHNIQUE: Right digital diagnostic mammography was performed. COMPARISON:  Previous exam(s). ACR Breast Density Category c: The breasts are heterogeneously dense, which may obscure small masses. FINDINGS: There is a 1.7 cm group of pleomorphic calcifications in the upper-outer quadrant of the right breast. IMPRESSION: 1.7 cm group of indeterminate calcifications in the upper-outer right breast. RECOMMENDATION: 1. Stereotactic biopsy is recommended for the right breast calcifications. We will schedule the patient for the procedure at her earliest convenience. 2. Consider genetics assessment to determine the patient's lifetime risk of breast cancer given her family history, if this has not already been performed. Per American Cancer Society guidelines, if the patient has a calculated lifetime risk of developing breast cancer of greater than 20%, annual screening MRI of the breasts  would be recommended at the time  of screening mammography. I have discussed the findings and recommendations with the patient. If applicable, a reminder letter will be sent to the patient regarding the next appointment. BI-RADS CATEGORY  4: Suspicious. Electronically Signed   By: Frederico Hamman M.D.   On: 10/21/2022 13:47   MM 3D SCREENING MAMMOGRAM BILATERAL BREAST  Result Date: 10/15/2022 CLINICAL DATA:  Screening. EXAM: DIGITAL SCREENING BILATERAL MAMMOGRAM WITH TOMOSYNTHESIS AND CAD TECHNIQUE: Bilateral screening digital craniocaudal and mediolateral oblique mammograms were obtained. Bilateral screening digital breast tomosynthesis was performed. The images were evaluated with computer-aided detection. COMPARISON:  Previous exam(s). ACR Breast Density Category b: There are scattered areas of fibroglandular density. FINDINGS: In the right breast, calcifications warrant further evaluation with magnified views. In the left breast, no findings suspicious for malignancy. IMPRESSION: Further evaluation is suggested for calcifications in the right breast. RECOMMENDATION: Diagnostic mammogram of the right breast. (Code:[id number]) The patient will be contacted regarding the findings, and additional imaging will be scheduled. BI-RADS CATEGORY  0: Incomplete: Need additional imaging evaluation. Electronically Signed   By: Ted Mcalpine M.D.   On: 10/15/2022 16:05    ASSESSMENT & PLAN:  67 year old female with past medical history of diabetes and osteoporosis, presented with screening discovered right breast DCIS   67 year-old menopausal woman, presented with screening discovered to DCIS.   1.  breast DCIS, grade 3,  ER+ /PR- -I discussed her breast imaging and needle biopsy results with patient and her family members in great detail. -She is a candidate for breast conservation surgery. She has been seen by breast surgeon Dr. Dwain Sarna, who recommends lumpectomy. --Her DCIS will be cured by complete surgical resection. Any form of  adjuvant therapy is preventive. -Given her strongly positive/negative ER and high risk of future breast cancer (17% in the next 10 years, based on [facility] DCIS nomogram), I recommend antiestrogen therapy with low-dose tamoxifen 5 mg daily for 3 years, which decrease her risk of future breast cancer by ~40%.  -The potential side effects, which includes but not limited to, hot flash, skin and vaginal dryness, slightly increased risk of cardiovascular disease and cataract, small risk of thrombosis and endometrial cancer, were discussed with her in great details.  Due to the low-dose tamoxifen, her risk of thrombosis is minimal.  We also discussed very small risk of endometrial cancer, she does have some vaginal spotting, will follow-up with her gynecologist.  We also discussed that tamoxifen can potentially improve her bone density, she is a benefit for her since she has osteoporosis. -She would also benefit from adjuvant radiation if she is motivated, she will be seen by radiation oncologist Dr. Mitzi Hansen today.  2.  Osteoporosis -Was previously on oral alendronate for 5 years, came off a few years ago -Will monitor her bone density. -Discussed that tamoxifen can strengthen her bone.   Plan -She will likely have lumpectomy soon -I'll see her after radiation or 1-2 months after her surgery to review her pathology result and start her on Tamoxifen       No orders of the defined types were placed in this encounter.   All questions were answered. The patient knows to call the clinic with any problems, questions or concerns. I spent 35 minutes counseling the patient face to face. The total time spent in the appointment was 45 minutes and more than 50% was on counseling.     Malachy Mood, MD 11/03/2022 11:46 AM

## 2022-11-05 ENCOUNTER — Other Ambulatory Visit: Payer: Self-pay | Admitting: General Surgery

## 2022-11-05 DIAGNOSIS — D0511 Intraductal carcinoma in situ of right breast: Secondary | ICD-10-CM

## 2022-11-11 ENCOUNTER — Encounter: Payer: Self-pay | Admitting: *Deleted

## 2022-11-11 ENCOUNTER — Telehealth: Payer: Self-pay | Admitting: *Deleted

## 2022-11-11 DIAGNOSIS — D0511 Intraductal carcinoma in situ of right breast: Secondary | ICD-10-CM

## 2022-11-11 NOTE — Telephone Encounter (Signed)
Spoke to pt concerning  [facility] from 11/03/22. Denies questions or concerns regarding dx or treatment care plan. Encourage pt to call with needs. Received verbal understanding. Informed pt will receive a call with an appt to see Dr Marena Chancy for xrt at [facility]

## 2022-11-12 ENCOUNTER — Telehealth: Payer: Self-pay | Admitting: Genetic Counselor

## 2022-11-12 ENCOUNTER — Encounter: Payer: Self-pay | Admitting: Genetic Counselor

## 2022-11-12 DIAGNOSIS — Z1589 Genetic susceptibility to other disease: Secondary | ICD-10-CM

## 2022-11-12 DIAGNOSIS — Z1379 Encounter for other screening for genetic and chromosomal anomalies: Secondary | ICD-10-CM | POA: Insufficient documentation

## 2022-11-12 HISTORY — DX: Genetic susceptibility to other disease: Z15.89

## 2022-11-12 NOTE — Telephone Encounter (Signed)
Called home number in attempt to disclose genetics results.  Patient not available.  Told patient's husbands that will call back early next week.

## 2022-11-16 ENCOUNTER — Encounter: Payer: Self-pay | Admitting: Genetic Counselor

## 2022-11-16 ENCOUNTER — Telehealth: Payer: Self-pay | Admitting: Genetic Counselor

## 2022-11-16 ENCOUNTER — Ambulatory Visit: Payer: Self-pay | Admitting: Genetic Counselor

## 2022-11-16 DIAGNOSIS — Z1589 Genetic susceptibility to other disease: Secondary | ICD-10-CM

## 2022-11-16 DIAGNOSIS — D0511 Intraductal carcinoma in situ of right breast: Secondary | ICD-10-CM

## 2022-11-16 DIAGNOSIS — Z1379 Encounter for other screening for genetic and chromosomal anomalies: Secondary | ICD-10-CM

## 2022-11-16 DIAGNOSIS — Z803 Family history of malignant neoplasm of breast: Secondary | ICD-10-CM

## 2022-11-16 NOTE — Telephone Encounter (Signed)
Contacted patient in attempt to disclose results of genetic testing.  LVM on mobile number with contact information requesting a call back.

## 2022-11-16 NOTE — Progress Notes (Signed)
GENETIC TEST RESULTS  Patient Name: [name] Patient Age: 67 y.o. Encounter Date: 11/16/2022  Referring Provider: Malachy Mood, MD  [name] was seen in the Cancer Genetics clinic on November 03, 2022 due to a personal and family history of breast cancer and concern regarding a hereditary predisposition to cancer in the family. Please refer to the prior Genetics clinic note for more information regarding [name]'s medical and family histories and our assessment at the time.   Oncology History  Ductal carcinoma in situ (DCIS) of right breast  10/27/2022 Cancer Staging   Staging form: Breast, AJCC 8th Edition - Clinical stage from 10/27/2022: Stage 0 (cTis (DCIS), cN0, cM0, G3, ER+, PR-, HER2: Not Assessed) - Signed by Malachy Mood, MD on 11/03/2022 Stage prefix: Initial diagnosis Histologic grading system: 3 grade system   11/01/2022 Initial Diagnosis   Ductal carcinoma in situ (DCIS) of right breast     FAMILY HISTORY:  We obtained a detailed, 4-generation family history.  Significant diagnoses are listed below:      Family History  Problem Relation Age of Onset   Throat cancer Mother     Breast cancer Sister 40        d. 14; mets   Breast cancer Sister 36        d. 11s; mets       [name] is unaware of previous family history of genetic testing for hereditary cancer risks. There is no reported Ashkenazi Jewish ancestry. There is no known consanguinity.    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    No other pathogenic variants were detected in the [facility] Common Hereditary Cancers +RNA Panel.   The [facility] Common Hereditary Cancers + RNA Panel includes sequencing, deletion/duplication, and RNA analysis of the following 48 genes: APC, ATM, AXIN2, BAP1, BARD1, BMPR1A, BRCA1, BRCA2, BRIP1, CDH1, CDK4*, CDKN2A*, CHEK2, CTNNA1, DICER1, EPCAM* (del/dup only),  FH, GREM1* (promoter dup analysis only), HOXB13*, KIT*, MBD4*, MEN1, MLH1, MSH2, MSH3, MSH6, MUTYH, NF1, NTHL1, PALB2, PDGFRA*, PMS2, POLD1, POLE, PTEN, RAD51C, RAD51D, SDHA (sequencing only), SDHB, SDHC, SDHD, SMAD4, SMARCA4, STK11, TP53, TSC1, TSC2, VHL.  *Genes without RNA analysis.    The test report has been scanned into EPIC and is located under the Molecular Pathology section of the Results Review tab.  A portion of the result report is included below for reference. Genetic testing reported out on November 09, 2022.     Cancer Risks for CHEK2: Females have a 20-40% lifetime risk of breast cancer. Males are thought to be at an increased risk of prostate cancer. The exact risk figure is unknown at this time. No increased risk for colon cancer.    Research is continuing to help learn more about the cancers associated with CHEK2 pathogenic variants and what the exact risks are to develop these cancers.  Management Recommendations:  Breast Cancer Screening/Risk Reduction: Females: Breast cancer screening includes: Breast awareness beginning at age 49 Monthly self-breast examination beginning at age 28 Clinical breast examination every 6-12 months beginning at age 63 or at the age of the earliest diagnosed breast cancer in the family, if onset was before age 53 Annual mammogram with consideration of tomosynthesis starting at age 66 or 10 years prior to the youngest age of diagnosis, whichever comes first Consider breast MRI with contrast starting at age 55-35 Evidence is insufficient for a prophylactic risk-reducing mastectomy, manage  based on family history   Colon Cancer Screening: General population screening is appropriate Manage based on personal and family history   Prostate Cancer Screening: Consider prostate cancer screening beginning at age 74   This information is based on current understanding of the gene and may change in the future.   Implications for Family  Members: Hereditary predisposition to cancer due to pathogenic variants in the CHEK2 gene has autosomal dominant inheritance. This means that an individual with a pathogenic variant has a 50% chance of passing the condition on to his/her offspring. Identification of a pathogenic variant allows for the recognition of at-risk relatives who can pursue testing for the familial variant.   Family members are encouraged to consider genetic testing for this familial pathogenic variant. As there are generally no childhood cancer risks associated with single pathogenic variants in the CHEK2 gene, individuals in the family are not recommended to have testing until they reach at least 68 years of age. Complimentary testing for the familial variant is available for 150 days after the report date. They may contact our office at [phone] for more information or to schedule an appointment. Family members who live outside of the area are encouraged to find a genetic counselor in their area by visiting: [email or web address].   Resources: FORCE (Facing Our Risk of Cancer Empowered) is a resource for those with a hereditary predisposition to develop cancer.  FORCE provides information about risk reduction, advocacy, legislation, and clinical trials.  Additionally, FORCE provides a platform for collaboration and support; which includes: peer navigation, message boards, local support groups, a toll-free helpline, research registry and recruitment, advocate training, published medical research, webinars, brochures, mastectomy photos, and more.  For more information, visit www.facingourrisk.org  Plan:  Discussed with patient no changes to breast cancer treatment plan based on this result. Notified oncology team.  She may discuss considering annual breast MRIs in addtition to annual mammograms for breast cancer screening.  Family letter provided to encourage family testing.  Our contact number was  provided. [name] questions were answered to her satisfaction, and she knows she is welcome to call us at anytime with additional questions or concerns.   Reid Regas M. Rennie Plowman, MS, [facility] Certified Education officer, [email or web address] (P) [phone]

## 2022-11-16 NOTE — Telephone Encounter (Signed)
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  Discussed increased risk for breast cancer and possibly prostate cancer.  Discussed no changes in treatment plan based on this result; however, her providers may discuss considering breast MRIs for future breast screening.  Discussed family implications.  Providing written information on CHEK2 and family sharing letters to home address. Detailed clinic note to follow.      Leviticus Harton M. Rennie Plowman, MS, [facility] Genetic Counselor [email or web address] (P) [phone]

## 2022-11-29 ENCOUNTER — Other Ambulatory Visit: Payer: Self-pay | Admitting: Medical

## 2022-11-29 DIAGNOSIS — I493 Ventricular premature depolarization: Secondary | ICD-10-CM

## 2022-11-30 NOTE — Pre-Procedure Instructions (Signed)
Surgical Instructions   Your procedure is scheduled on Tuesday, September 10th. Report to [facility] Main Entrance "A" at 05:30 A.M., then check in with the Admitting office. Any questions or running late day of surgery: call [phone]  Questions prior to your surgery date: call [phone], Monday-Friday, 8am-4pm. If you experience any cold or flu symptoms such as cough, fever, chills, shortness of breath, etc. between now and your scheduled surgery, please notify us at the above number.     Remember:  Do not eat after midnight the night before your surgery   You may drink clear liquids until 04:30 AM the morning of your surgery.   Clear liquids allowed are: Water, Non-Citrus Juices (without pulp), Carbonated Beverages, Clear Tea, Black Coffee Only (NO MILK, CREAM OR POWDERED CREAMER of any kind), and Gatorade.  Patient Instructions  The night before surgery:  No food after midnight. ONLY clear liquids after midnight   The day of surgery (if you have diabetes): Drink ONE (1) 12 oz G2 given to you in your pre admission testing appointment by 04:30 AM the morning of surgery. Drink in one sitting. Do not sip.  This drink was given to you during your hospital  pre-op appointment visit.  Nothing else to drink after completing the  12 oz bottle of G2.         If you have questions, please contact your surgeon's office.    Take these medicines the morning of surgery with A SIP OF WATER  levothyroxine (SYNTHROID)  metoprolol succinate (TOPROL-XL)  nitrofurantoin (MACRODANTIN)    Follow your surgeon's instructions on when to stop Aspirin.  If no instructions were given by your surgeon then you will need to call the office to get those instructions.     One week prior to surgery, STOP taking any Aleve, Naproxen, Ibuprofen, Motrin, Advil, Goody's, BC's, all herbal medications, fish oil, and non-prescription vitamins.  WHAT DO I DO ABOUT MY DIABETES MEDICATION?   Do not take  metFORMIN (GLUCOPHAGE) the morning of surgery.  THE NIGHT BEFORE SURGERY, take  17 units (50%) of Insulin Glargine [facility]      If your CBG is greater than 220 mg/dL, you may take  of your sliding scale (correction) dose of insulin lispro (HUMALOG).   HOW TO MANAGE YOUR DIABETES BEFORE AND AFTER SURGERY  Why is it important to control my blood sugar before and after surgery? Improving blood sugar levels before and after surgery helps healing and can limit problems. A way of improving blood sugar control is eating a healthy diet by:  Eating less sugar and carbohydrates  Increasing activity/exercise  Talking with your doctor about reaching your blood sugar goals High blood sugars (greater than 180 mg/dL) can raise your risk of infections and slow your recovery, so you will need to focus on controlling your diabetes during the weeks before surgery. Make sure that the doctor who takes care of your diabetes knows about your planned surgery including the date and location.  How do I manage my blood sugar before surgery? Check your blood sugar at least 4 times a day, starting 2 days before surgery, to make sure that the level is not too high or low.  Check your blood sugar the morning of your surgery when you wake up and every 2 hours until you get to the Short Stay unit.  If your blood sugar is less than 70 mg/dL, you will need to treat for low blood sugar: Do not take  insulin. Treat a low blood sugar (less than 70 mg/dL) with  cup of clear juice (cranberry or apple), 4 glucose tablets, OR glucose gel. Recheck blood sugar in 15 minutes after treatment (to make sure it is greater than 70 mg/dL). If your blood sugar is not greater than 70 mg/dL on recheck, call [phone] for further instructions. Report your blood sugar to the short stay nurse when you get to Short Stay.  If you are admitted to the hospital after surgery: Your blood sugar will be checked by the staff and you  will probably be given insulin after surgery (instead of oral diabetes medicines) to make sure you have good blood sugar levels. The goal for blood sugar control after surgery is 80-180 mg/dL.                     Do NOT Smoke (Tobacco/Vaping) for 24 hours prior to your procedure.  If you use a CPAP at night, you may bring your mask/headgear for your overnight stay.   You will be asked to remove any contacts, glasses, piercing's, hearing aid's, dentures/partials prior to surgery. Please bring cases for these items if needed.    Patients discharged the day of surgery will not be allowed to drive home, and someone needs to stay with them for 24 hours.  SURGICAL WAITING ROOM VISITATION Patients may have no more than 2 support people in the waiting area - these visitors may rotate.   Pre-op nurse will coordinate an appropriate time for 1 ADULT support person, who may not rotate, to accompany patient in pre-op.  Children under the age of 41 must have an adult with them who is not the patient and must remain in the main waiting area with an adult.  If the patient needs to stay at the hospital during part of their recovery, the visitor guidelines for inpatient rooms apply.  Please refer to the [facility] website for the visitor guidelines for any additional information.   If you received a COVID test during your pre-op visit  it is requested that you wear a mask when out in public, stay away from anyone that may not be feeling well and notify your surgeon if you develop symptoms. If you have been in contact with anyone that has tested positive in the last 10 days please notify you surgeon.      Pre-operative CHG Bathing Instructions   You can play a key role in reducing the risk of infection after surgery. Your skin needs to be as free of germs as possible. You can reduce the number of germs on your skin by washing with CHG (chlorhexidine gluconate) soap before surgery. CHG is an antiseptic soap  that kills germs and continues to kill germs even after washing.   DO NOT use if you have an allergy to chlorhexidine/CHG or antibacterial soaps. If your skin becomes reddened or irritated, stop using the CHG and notify one of our RNs at [phone].              TAKE A SHOWER THE NIGHT BEFORE SURGERY AND THE DAY OF SURGERY    Please keep in mind the following:  DO NOT shave, including legs and underarms, 48 hours prior to surgery.   You may shave your face before/day of surgery.  Place clean sheets on your bed the night before surgery Use a clean washcloth (not used since being washed) for each shower. DO NOT sleep with pet's night before surgery.  CHG Shower Instructions:  If you choose to wash your hair and private area, wash first with your normal shampoo/soap.  After you use shampoo/soap, rinse your hair and body thoroughly to remove shampoo/soap residue.  Turn the water OFF and apply half the bottle of CHG soap to a CLEAN washcloth.  Apply CHG soap ONLY FROM YOUR NECK DOWN TO YOUR TOES (washing for 3-5 minutes)  DO NOT use CHG soap on face, private areas, open wounds, or sores.  Pay special attention to the area where your surgery is being performed.  If you are having back surgery, having someone wash your back for you may be helpful. Wait 2 minutes after CHG soap is applied, then you may rinse off the CHG soap.  Pat dry with a clean towel  Put on clean pajamas    Additional instructions for the day of surgery: DO NOT APPLY any lotions, deodorants, cologne, or perfumes.   Do not wear jewelry or makeup Do not wear nail polish, gel polish, artificial nails, or any other type of covering on natural nails (fingers and toes) Do not bring valuables to the hospital. [facility] is not responsible for valuables/personal belongings. Put on clean/comfortable clothes.  Please brush your teeth.  Ask your nurse before applying any prescription medications to the skin.

## 2022-12-01 ENCOUNTER — Encounter (HOSPITAL_COMMUNITY): Payer: Self-pay

## 2022-12-01 ENCOUNTER — Other Ambulatory Visit: Payer: Self-pay | Admitting: Obstetrics and Gynecology

## 2022-12-01 ENCOUNTER — Other Ambulatory Visit: Payer: Self-pay

## 2022-12-01 ENCOUNTER — Encounter (HOSPITAL_COMMUNITY)
Admission: RE | Admit: 2022-12-01 | Discharge: 2022-12-01 | Disposition: A | Discharge status: Home / Self Care | Payer: Medicare Other | Source: Ambulatory Visit | Attending: General Surgery | Admitting: General Surgery

## 2022-12-01 VITALS — BP 138/75 | HR 81 | Temp 97.5°F | Resp 18 | Ht 66.0 in | Wt 176.8 lb

## 2022-12-01 DIAGNOSIS — I2510 Atherosclerotic heart disease of native coronary artery without angina pectoris: Secondary | ICD-10-CM | POA: Diagnosis not present

## 2022-12-01 DIAGNOSIS — Z01812 Encounter for preprocedural laboratory examination: Secondary | ICD-10-CM | POA: Insufficient documentation

## 2022-12-01 DIAGNOSIS — I10 Essential (primary) hypertension: Secondary | ICD-10-CM | POA: Diagnosis not present

## 2022-12-01 DIAGNOSIS — E785 Hyperlipidemia, unspecified: Secondary | ICD-10-CM | POA: Insufficient documentation

## 2022-12-01 DIAGNOSIS — Z01818 Encounter for other preprocedural examination: Secondary | ICD-10-CM

## 2022-12-01 DIAGNOSIS — E119 Type 2 diabetes mellitus without complications: Secondary | ICD-10-CM | POA: Diagnosis not present

## 2022-12-01 HISTORY — DX: Personal history of urinary calculi: Z87.442

## 2022-12-01 LAB — CBC
HCT: 43 % (ref 36.0–46.0)
Hemoglobin: 14.5 g/dL (ref 12.0–15.0)
MCH: 30.5 pg (ref 26.0–34.0)
MCHC: 33.7 g/dL (ref 30.0–36.0)
MCV: 90.5 fL (ref 80.0–100.0)
Platelets: 314 10*3/uL (ref 150–400)
RBC: 4.75 MIL/uL (ref 3.87–5.11)
RDW: 12.6 % (ref 11.5–15.5)
WBC: 6 10*3/uL (ref 4.0–10.5)
nRBC: 0 % (ref 0.0–0.2)

## 2022-12-01 LAB — HEMOGLOBIN A1C
Hgb A1c MFr Bld: 7 % — ABNORMAL HIGH (ref 4.8–5.6)
Mean Plasma Glucose: 154.2 mg/dL

## 2022-12-01 LAB — BASIC METABOLIC PANEL
Anion gap: 10 (ref 5–15)
BUN: 8 mg/dL (ref 8–23)
CO2: 27 mmol/L (ref 22–32)
Calcium: 9.7 mg/dL (ref 8.9–10.3)
Chloride: 98 mmol/L (ref 98–111)
Creatinine, Ser: 0.58 mg/dL (ref 0.44–1.00)
GFR, Estimated: >60 mL/min (ref >60)
Glucose, Bld: 190 mg/dL — ABNORMAL HIGH (ref 70–99)
Potassium: 3.9 mmol/L (ref 3.5–5.1)
Sodium: 135 mmol/L (ref 135–145)

## 2022-12-01 LAB — GLUCOSE, CAPILLARY: Glucose-Capillary: 182 mg/dL — ABNORMAL HIGH (ref 70–99)

## 2022-12-01 NOTE — Progress Notes (Signed)
PCP - Dr. Guerry Bruin Cardiologist - Dr.Muhammad Kirke Corin Oncologist- Dr. Mosetta Putt Urologist- Dr. Tawanna Cooler McDiarmid  PPM/ICD - denies   Chest x-ray - 06/19/19 EKG - 03/08/22 Stress Test - 06/19/18 ECHO - 06/15/18 Cardiac Cath - denies  Sleep Study - denies   Fasting Blood Sugar - 90-150 Checks Blood Sugar 2-3 times a day  Last dose of GLP1 agonist-  n/a   Blood Thinner Instructions: n/a Aspirin Instructions: f/u with surgeon  ERAS Protcol - yes PRE-SURGERY G2- given at PAT  COVID TEST- n/a   Anesthesia review: yes, cardiac hx, breast seed placement, family hx of malignant hyperthermia  Patient denies shortness of breath, fever, cough and chest pain at PAT appointment   All instructions explained to the patient, with a verbal understanding of the material. Patient agrees to go over the instructions while at home for a better understanding.  The opportunity to ask questions was provided.

## 2022-12-01 NOTE — Progress Notes (Signed)
LVM with Dr. Tenny Craw' office letting them know that we need surgical orders.

## 2022-12-02 NOTE — Progress Notes (Signed)
Anesthesia Chart Review:  67 year old female with pertinent history including IDDM 2, renal insufficiency, atypical chest pain, anemia, history of total thyroidectomy 05/2019, family history of malignant hyperthermia in patient's sister.  Last seen by cardiology APP Charlsie Quest, NP 03/08/2022 for management of HTN, HLD, PVCs, coronary atherosclerosis.  She was noted to be doing well at that time, no changes to management, 1 year follow-up recommended.  Prior evaluation of atypical chest pain in 2020 was benign, nuclear stress was low risk, echo showed EF 6065%, no significant valvular abnormalities.  Preop labs reviewed, unremarkable.  IDDM2 controlled with A1c 7.0.  EKG 03/04/2022: Sinus rhythm with frequent PVCs.  Rate 100.  Low-voltage QRS.  Nonspecific ST and T wave abnormality.  Myocardial Perfusion 06/16/2018 Normal pharmacologic myocardial perfusion stress test without significant ischemia or scar. The left ventricular ejection fraction is normal (65%). Attenuation correction CT shows coronary artery and aortic atherosclerotic calcification. This is a low risk study.   Echo 06/15/2018 IMPRESSIONS   1. The left ventricle has normal systolic function with an ejection  fraction of 60-65%. The cavity size was normal. Left ventricular diastolic  Doppler parameters are consistent with impaired relaxation.   2. The right ventricle has normal systolic function. The cavity was  normal. There is no increase in right ventricular wall thickness. Normal  RVSP    [name] [facility] Short Stay Center/Anesthesiology Phone [phone] 12/02/2022 10:56 AM

## 2022-12-02 NOTE — Anesthesia Preprocedure Evaluation (Addendum)
Anesthesia Evaluation  Patient identified by MRN, date of birth, ID band Patient awake    Reviewed: Allergy & Precautions, NPO status , Patient's Chart, lab work & pertinent test results  History of Anesthesia Complications (+) MALIGNANT HYPERTHERMIA, Family history of anesthesia reaction and history of anesthetic complications  Airway Mallampati: III  TM Distance: >3 FB Neck ROM: Full    Dental no notable dental hx.    Pulmonary neg pulmonary ROS   Pulmonary exam normal        Cardiovascular negative cardio ROS Normal cardiovascular exam     Neuro/Psych negative neurological ROS  negative psych ROS   GI/Hepatic negative GI ROS, Neg liver ROS,,,  Endo/Other  diabetes, Insulin Dependent, Oral Hypoglycemic AgentsHypothyroidism    Renal/GU negative Renal ROS     Musculoskeletal negative musculoskeletal ROS (+)    Abdominal   Peds  Hematology negative hematology ROS (+)   Anesthesia Other Findings RIGHT BREAST CANCER  Reproductive/Obstetrics                              Anesthesia Physical Anesthesia Plan  ASA: 2  Anesthesia Plan: General   Post-op Pain Management:    Induction: Intravenous  PONV Risk Score and Plan: 3 and Ondansetron, Dexamethasone, Propofol infusion, Midazolam, Treatment may vary due to age or medical condition and TIVA  Airway Management Planned: LMA  Additional Equipment:   Intra-op Plan:   Post-operative Plan: Extubation in OR  Informed Consent: I have reviewed the patients History and Physical, chart, labs and discussed the procedure including the risks, benefits and alternatives for the proposed anesthesia with the patient or authorized representative who has indicated his/her understanding and acceptance.     Dental advisory given  Plan Discussed with: CRNA  Anesthesia Plan Comments: (PAT note by Antionette Poles, PA-C: 67 year old female with  pertinent history including IDDM 2, renal insufficiency, atypical chest pain, anemia, history of total thyroidectomy 05/2019, family history of malignant hyperthermia in patient's sister.  Last seen by cardiology APP Charlsie Quest, NP 03/08/2022 for management of HTN, HLD, PVCs, coronary atherosclerosis.  She was noted to be doing well at that time, no changes to management, 1 year follow-up recommended.  Prior evaluation of atypical chest pain in 2020 was benign, nuclear stress was low risk, echo showed EF 60-65%, no significant valvular abnormalities.  Preop labs reviewed, unremarkable.  IDDM2 controlled with A1c 7.0.  EKG 03/04/2022: Sinus rhythm with frequent PVCs.  Rate 100.  Low-voltage QRS.  Nonspecific ST and T wave abnormality.  Myocardial Perfusion 06/16/2018  Normal pharmacologic myocardial perfusion stress test without significant ischemia or scar.  The left ventricular ejection fraction is normal (65%).  Attenuation correction CT shows coronary artery and aortic atherosclerotic calcification.  This is a low risk study.   Echo 06/15/2018 IMPRESSIONS   1. The left ventricle has normal systolic function with an ejection  fraction of 60-65%. The cavity size was normal. Left ventricular diastolic  Doppler parameters are consistent with impaired relaxation.   2. The right ventricle has normal systolic function. The cavity was  normal. There is no increase in right ventricular wall thickness. Normal  RVSP  )         Anesthesia Quick Evaluation

## 2022-12-06 ENCOUNTER — Ambulatory Visit
Admission: RE | Admit: 2022-12-06 | Discharge: 2022-12-06 | Disposition: A | Discharge status: Home / Self Care | Payer: Medicare Other | Source: Ambulatory Visit | Attending: General Surgery | Admitting: General Surgery

## 2022-12-06 DIAGNOSIS — D0511 Intraductal carcinoma in situ of right breast: Secondary | ICD-10-CM

## 2022-12-06 HISTORY — PX: BREAST BIOPSY: SHX20

## 2022-12-06 NOTE — H&P (Signed)
65 yof with screening detected right breast calcifications. She has c density breast tissue. She has history of two sisters with breast cancer diagnosed 20 and 1 who are both deceased. Area was in uoq and measured 1.7 cm. 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 No mass or dc noted. She is retired and they live in [place].  Review of Systems: A complete review of systems was obtained from the patient. I have reviewed this information and discussed as appropriate with the patient. See HPI as well for other ROS.  Review of Systems  All other systems reviewed and are negative.   Medical History: Past Medical History:  Diagnosis Date  Anemia  Diabetes mellitus without complication (CMS/HHS-HCC)  History of cancer  Hyperlipidemia  Hypertension  Osteoporosis  Thyroid disease   Patient Active Problem List  Diagnosis  Ductal carcinoma in situ (DCIS) of right breast   Past Surgical History:  Procedure Laterality Date  EXCISION LIPOMA LEFT LOWER QUARDRANT ABDOMINAL WALL 03/15/2014  Dr. Janee Morn  Trigger Finger Release 07/14/2017  THYROIDECTOMY TOTAL 06/25/2019  CESAREAN SECTION  TONSILLECTOMY   No Known Allergies  Current Outpatient Medications on File Prior to Visit  Medication Sig Dispense Refill  alendronate (FOSAMAX) 70 MG tablet Take 1 tablet by mouth once a week  ascorbic acid, vitamin C, (VITAMIN C) 250 MG tablet Take 1 tablet by mouth once daily  aspirin 81 MG EC tablet Take 1 tablet by mouth once daily  BASAGLAR KWIKPEN U-100 INSULIN pen injector (concentration 100 units/mL) Inject subcutaneously as directed  calcium carbonate (TUMS) 200 mg calcium (500 mg) chewable tablet Take 2 tablets by mouth 2 (two) times daily  HUMALOG U-100 INSULIN injection (concentration 100 units/mL) Inject subcutaneously  levothyroxine (SYNTHROID) 88 MCG tablet Take 1 tablet by mouth once daily Before breakfast  metFORMIN (GLUCOPHAGE) 1000 MG tablet Take 1 tablet by mouth  2 (two) times daily  metoprolol succinate (TOPROL-XL) 25 MG XL tablet Take 1 tablet by mouth once daily  ramipriL (ALTACE) 5 MG capsule Take 1 capsule by mouth once daily  rosuvastatin (CRESTOR) 20 MG tablet Take 1 tablet by mouth once daily  traMADoL (ULTRAM) 50 mg tablet Take 1-2 tablets by mouth every 6 (six) hours as needed   Family History  Problem Relation Age of Onset  Coronary Artery Disease (Blocked arteries around heart) Mother  Heart disease Mother  Diabetes Father  Skin cancer Sister  Breast cancer Sister  Heart disease Other  Lung cancer Other    Social History   Tobacco Use  Smoking Status Never  Smokeless Tobacco Never  Marital status: Married  Tobacco Use  Smoking status: Never  Smokeless tobacco: Never  Vaping Use  Vaping status: Never Used  Substance and Sexual Activity  Alcohol use: Never  Drug use: Never  Sexual activity: Defer   Objective:   Physical Exam Vitals reviewed.  Constitutional:  Appearance: Normal appearance.  Chest:  Breasts: Right: No inverted nipple, mass or nipple discharge.  Left: No inverted nipple, mass or nipple discharge.  Lymphadenopathy:  Upper Body:  Right upper body: No supraclavicular or axillary adenopathy.  Left upper body: No supraclavicular or axillary adenopathy.  Neurological:  Mental Status: She is alert.    Assessment and Plan:   Ductal carcinoma in situ (DCIS) of right breast  Right breast seed guided lumpectomy  We discussed the staging and pathophysiology of breast cancer. We discussed all of the different options for treatment for breast cancer including  surgery,radiation therapy, and antiestrogen therapy.  We discussed that she does not need a sn biopsy due to noninvasive nature of this tumor.  We discussed the options for treatment of the breast cancer which included lumpectomy versus a mastectomy. We discussed the performance of the lumpectomy with radioactive seed placement. We discussed a 5-10%  chance of a positive margin requiring reexcision in the operating room. We also discussed that she will likely need radiation therapy if she undergoes lumpectomy. We discussed mastectomy and the postoperative care for that as well. Mastectomy can be followed by reconstruction. The decision for lumpectomy vs mastectomy has no impact on decision for chemotherapy. Most mastectomy patients will not need radiation therapy. We discussed that there is no difference in her survival whether she undergoes lumpectomy with radiation therapy or antiestrogen therapy versus a mastectomy. There is also no real difference between her recurrence in the breast. We discussed the risks of operation including bleeding, infection, possible reoperation. She understands her further therapy will be based on what her stages at the time of her operation.

## 2022-12-07 ENCOUNTER — Ambulatory Visit (HOSPITAL_COMMUNITY): Payer: Medicare Other | Admitting: Physician Assistant

## 2022-12-07 ENCOUNTER — Ambulatory Visit (HOSPITAL_BASED_OUTPATIENT_CLINIC_OR_DEPARTMENT_OTHER): Payer: Medicare Other | Admitting: Registered Nurse

## 2022-12-07 ENCOUNTER — Other Ambulatory Visit: Payer: Self-pay

## 2022-12-07 ENCOUNTER — Encounter (HOSPITAL_COMMUNITY)
Admission: RE | Disposition: A | Discharge status: Home / Self Care | Payer: Self-pay | Source: Ambulatory Visit | Attending: General Surgery

## 2022-12-07 ENCOUNTER — Ambulatory Visit
Admission: RE | Admit: 2022-12-07 | Discharge: 2022-12-07 | Disposition: A | Discharge status: Home / Self Care | Payer: Medicare Other | Source: Ambulatory Visit | Attending: General Surgery | Admitting: General Surgery

## 2022-12-07 ENCOUNTER — Ambulatory Visit (HOSPITAL_COMMUNITY)
Admission: RE | Admit: 2022-12-07 | Discharge: 2022-12-07 | Disposition: A | Discharge status: Home / Self Care | Payer: Medicare Other | Source: Ambulatory Visit | Attending: General Surgery | Admitting: General Surgery

## 2022-12-07 DIAGNOSIS — C50411 Malignant neoplasm of upper-outer quadrant of right female breast: Secondary | ICD-10-CM | POA: Insufficient documentation

## 2022-12-07 DIAGNOSIS — X58XXXA Exposure to other specified factors, initial encounter: Secondary | ICD-10-CM | POA: Insufficient documentation

## 2022-12-07 DIAGNOSIS — D0511 Intraductal carcinoma in situ of right breast: Secondary | ICD-10-CM

## 2022-12-07 DIAGNOSIS — E039 Hypothyroidism, unspecified: Secondary | ICD-10-CM | POA: Diagnosis not present

## 2022-12-07 DIAGNOSIS — Z803 Family history of malignant neoplasm of breast: Secondary | ICD-10-CM | POA: Insufficient documentation

## 2022-12-07 DIAGNOSIS — Z170 Estrogen receptor positive status [ER+]: Secondary | ICD-10-CM | POA: Diagnosis not present

## 2022-12-07 DIAGNOSIS — Z7984 Long term (current) use of oral hypoglycemic drugs: Secondary | ICD-10-CM | POA: Diagnosis not present

## 2022-12-07 DIAGNOSIS — N6021 Fibroadenosis of right breast: Secondary | ICD-10-CM | POA: Diagnosis not present

## 2022-12-07 DIAGNOSIS — E119 Type 2 diabetes mellitus without complications: Secondary | ICD-10-CM | POA: Diagnosis not present

## 2022-12-07 DIAGNOSIS — Z794 Long term (current) use of insulin: Secondary | ICD-10-CM | POA: Diagnosis not present

## 2022-12-07 DIAGNOSIS — N858 Other specified noninflammatory disorders of uterus: Secondary | ICD-10-CM | POA: Diagnosis not present

## 2022-12-07 DIAGNOSIS — S3769XA Other injury of uterus, initial encounter: Secondary | ICD-10-CM | POA: Diagnosis not present

## 2022-12-07 DIAGNOSIS — N950 Postmenopausal bleeding: Secondary | ICD-10-CM | POA: Diagnosis not present

## 2022-12-07 DIAGNOSIS — Z1501 Genetic susceptibility to malignant neoplasm of breast: Secondary | ICD-10-CM | POA: Diagnosis not present

## 2022-12-07 HISTORY — PX: HYSTEROSCOPY WITH D & C: SHX1775

## 2022-12-07 HISTORY — PX: BREAST LUMPECTOMY WITH RADIOACTIVE SEED LOCALIZATION: SHX6424

## 2022-12-07 LAB — GLUCOSE, CAPILLARY
Glucose-Capillary: 142 mg/dL — ABNORMAL HIGH (ref 70–99)
Glucose-Capillary: 178 mg/dL — ABNORMAL HIGH (ref 70–99)

## 2022-12-07 SURGERY — BREAST LUMPECTOMY WITH RADIOACTIVE SEED LOCALIZATION
Anesthesia: General | Laterality: Right

## 2022-12-07 MED ORDER — FENTANYL CITRATE (PF) 250 MCG/5ML IJ SOLN
INTRAMUSCULAR | Status: AC
  Filled 2022-12-07: qty 5

## 2022-12-07 MED ORDER — ORAL CARE MOUTH RINSE: 15.0000 mL | Freq: Once | OROMUCOSAL | Status: AC

## 2022-12-07 MED ORDER — ENSURE PRE-SURGERY PO LIQD: 296.0000 mL | Freq: Once | ORAL | Status: DC

## 2022-12-07 MED ORDER — ACETAMINOPHEN 650 MG RE SUPP: 650.0000 mg | RECTAL | Status: DC | PRN

## 2022-12-07 MED ORDER — CHLORHEXIDINE GLUCONATE CLOTH 2 % EX PADS: 6.0000 | MEDICATED_PAD | Freq: Once | CUTANEOUS | Status: DC

## 2022-12-07 MED ORDER — OXYCODONE HCL 5 MG PO TABS
5.0000 mg | ORAL_TABLET | ORAL | Status: DC | PRN
  Administered 2022-12-07: 5 mg via ORAL

## 2022-12-07 MED ORDER — ONDANSETRON HCL 4 MG/2ML IJ SOLN: 4.0000 mg | Freq: Once | INTRAMUSCULAR | Status: DC | PRN

## 2022-12-07 MED ORDER — ACETAMINOPHEN 500 MG PO TABS
1000.0000 mg | ORAL_TABLET | ORAL | Status: AC
  Administered 2022-12-07: 1000 mg via ORAL
  Filled 2022-12-07: qty 2

## 2022-12-07 MED ORDER — PROPOFOL 500 MG/50ML IV EMUL
INTRAVENOUS | Status: DC | PRN
Start: 2022-12-07 — End: 2022-12-07
  Administered 2022-12-07: 200 ug/kg/min via INTRAVENOUS

## 2022-12-07 MED ORDER — PHENYLEPHRINE HCL-NACL 20-0.9 MG/250ML-% IV SOLN
INTRAVENOUS | Status: DC | PRN
  Administered 2022-12-07: 20 ug/min via INTRAVENOUS

## 2022-12-07 MED ORDER — BUPIVACAINE HCL (PF) 0.25 % IJ SOLN
INTRAMUSCULAR | Status: AC
  Filled 2022-12-07: qty 30

## 2022-12-07 MED ORDER — ONDANSETRON HCL 4 MG/2ML IJ SOLN
INTRAMUSCULAR | Status: DC | PRN
  Administered 2022-12-07: 4 mg via INTRAVENOUS

## 2022-12-07 MED ORDER — DEXAMETHASONE SODIUM PHOSPHATE 10 MG/ML IJ SOLN
INTRAMUSCULAR | Status: DC | PRN
  Administered 2022-12-07: 5 mg via INTRAVENOUS

## 2022-12-07 MED ORDER — ACETAMINOPHEN 325 MG PO TABS: 650.0000 mg | ORAL_TABLET | ORAL | Status: DC | PRN

## 2022-12-07 MED ORDER — KETOROLAC TROMETHAMINE 15 MG/ML IJ SOLN: 15.0000 mg | Freq: Once | INTRAMUSCULAR | Status: DC | PRN

## 2022-12-07 MED ORDER — PROPOFOL 10 MG/ML IV BOLUS
INTRAVENOUS | Status: AC
  Filled 2022-12-07: qty 20

## 2022-12-07 MED ORDER — SUGAMMADEX SODIUM 200 MG/2ML IV SOLN
INTRAVENOUS | Status: DC | PRN
  Administered 2022-12-07: 200 mg via INTRAVENOUS

## 2022-12-07 MED ORDER — BUPIVACAINE HCL (PF) 0.25 % IJ SOLN
INTRAMUSCULAR | Status: DC | PRN
Start: 2022-12-07 — End: 2022-12-07
  Administered 2022-12-07: 10 mL

## 2022-12-07 MED ORDER — POVIDONE-IODINE 10 % EX SWAB
2.0000 | Freq: Once | CUTANEOUS | Status: AC
  Administered 2022-12-07: 2 via TOPICAL

## 2022-12-07 MED ORDER — LACTATED RINGERS IV SOLN: INTRAVENOUS | Status: DC

## 2022-12-07 MED ORDER — ROCURONIUM BROMIDE 100 MG/10ML IV SOLN
INTRAVENOUS | Status: DC | PRN
Start: 2022-12-07 — End: 2022-12-07
  Administered 2022-12-07: 10 mg via INTRAVENOUS
  Administered 2022-12-07: 30 mg via INTRAVENOUS

## 2022-12-07 MED ORDER — OXYCODONE HCL 5 MG PO TABS
ORAL_TABLET | ORAL | Status: AC
  Filled 2022-12-07: qty 1

## 2022-12-07 MED ORDER — CEFAZOLIN SODIUM-DEXTROSE 2-4 GM/100ML-% IV SOLN
2.0000 g | INTRAVENOUS | Status: AC
  Administered 2022-12-07: 2 g via INTRAVENOUS
  Filled 2022-12-07: qty 100

## 2022-12-07 MED ORDER — FENTANYL CITRATE (PF) 100 MCG/2ML IJ SOLN
25.0000 ug | INTRAMUSCULAR | Status: DC | PRN
  Administered 2022-12-07: 25 ug via INTRAVENOUS

## 2022-12-07 MED ORDER — FENTANYL CITRATE (PF) 250 MCG/5ML IJ SOLN
INTRAMUSCULAR | Status: DC | PRN
  Administered 2022-12-07 (×3): 50 ug via INTRAVENOUS

## 2022-12-07 MED ORDER — DEXMEDETOMIDINE HCL IN NACL 80 MCG/20ML IV SOLN
INTRAVENOUS | Status: DC | PRN
Start: 2022-12-07 — End: 2022-12-07
  Administered 2022-12-07 (×2): 10 ug via INTRAVENOUS

## 2022-12-07 MED ORDER — PROPOFOL 10 MG/ML IV BOLUS
INTRAVENOUS | Status: DC | PRN
Start: 2022-12-07 — End: 2022-12-07
  Administered 2022-12-07: 200 mg via INTRAVENOUS

## 2022-12-07 MED ORDER — LIDOCAINE HCL 1 % IJ SOLN
INTRAMUSCULAR | Status: DC | PRN
  Administered 2022-12-07: 10 mL

## 2022-12-07 MED ORDER — KETOROLAC TROMETHAMINE 15 MG/ML IJ SOLN
INTRAMUSCULAR | Status: DC | PRN
Start: 2022-12-07 — End: 2022-12-07
  Administered 2022-12-07: 15 mg via INTRAVENOUS

## 2022-12-07 MED ORDER — LIDOCAINE HCL (CARDIAC) PF 100 MG/5ML IV SOSY
PREFILLED_SYRINGE | INTRAVENOUS | Status: DC | PRN
Start: 2022-12-07 — End: 2022-12-07
  Administered 2022-12-07: 60 mg via INTRATRACHEAL

## 2022-12-07 MED ORDER — CHLORHEXIDINE GLUCONATE 0.12 % MT SOLN
15.0000 mL | Freq: Once | OROMUCOSAL | Status: AC
  Administered 2022-12-07: 15 mL via OROMUCOSAL
  Filled 2022-12-07: qty 15

## 2022-12-07 MED ORDER — SODIUM CHLORIDE 0.9% FLUSH: 3.0000 mL | INTRAVENOUS | Status: DC | PRN

## 2022-12-07 MED ORDER — 0.9 % SODIUM CHLORIDE (POUR BTL) OPTIME
TOPICAL | Status: DC | PRN
  Administered 2022-12-07: 1000 mL

## 2022-12-07 MED ORDER — BUPIVACAINE-EPINEPHRINE (PF) 0.5% -1:200000 IJ SOLN
INTRAMUSCULAR | Status: AC
  Filled 2022-12-07: qty 30

## 2022-12-07 MED ORDER — SODIUM CHLORIDE 0.9 % IV SOLN: 250.0000 mL | INTRAVENOUS | Status: DC | PRN

## 2022-12-07 MED ORDER — LIDOCAINE HCL 1 % IJ SOLN
INTRAMUSCULAR | Status: AC
  Filled 2022-12-07: qty 20

## 2022-12-07 MED ORDER — AMISULPRIDE (ANTIEMETIC) 5 MG/2ML IV SOLN: 10.0000 mg | Freq: Once | INTRAVENOUS | Status: DC | PRN

## 2022-12-07 MED ORDER — FENTANYL CITRATE (PF) 100 MCG/2ML IJ SOLN
INTRAMUSCULAR | Status: AC
  Filled 2022-12-07: qty 2

## 2022-12-07 MED ORDER — SODIUM CHLORIDE 0.9 % IR SOLN
Status: DC | PRN
  Administered 2022-12-07: 1

## 2022-12-07 SURGICAL SUPPLY — 49 items
ABLATOR SURESOUND NOVASURE (ABLATOR) IMPLANT
ADH SKN CLS APL DERMABOND .7 (GAUZE/BANDAGES/DRESSINGS) ×2
APL PRP STRL LF DISP 70% ISPRP (MISCELLANEOUS) ×2
APPLIER CLIP 9.375 MED OPEN (MISCELLANEOUS)
APR CLP MED 9.3 20 MLT OPN (MISCELLANEOUS)
BAG COUNTER SPONGE SURGICOUNT (BAG) ×3 IMPLANT
BAG SPNG CNTER NS LX DISP (BAG)
BINDER BREAST LRG (GAUZE/BANDAGES/DRESSINGS) IMPLANT
BINDER BREAST XLRG (GAUZE/BANDAGES/DRESSINGS) IMPLANT
CANISTER SUCT 3000ML PPV (MISCELLANEOUS) ×3 IMPLANT
CATH ROBINSON RED A/P 16FR (CATHETERS) ×3 IMPLANT
CHLORAPREP W/TINT 26 (MISCELLANEOUS) ×3 IMPLANT
CLIP APPLIE 9.375 MED OPEN (MISCELLANEOUS) IMPLANT
CLIP TI MEDIUM 6 (CLIP) ×3 IMPLANT
COVER PROBE W GEL 5X96 (DRAPES) ×3 IMPLANT
COVER SURGICAL LIGHT HANDLE (MISCELLANEOUS) ×3 IMPLANT
DERMABOND ADVANCED .7 DNX12 (GAUZE/BANDAGES/DRESSINGS) ×3 IMPLANT
DEVICE DUBIN SPECIMEN MAMMOGRA (MISCELLANEOUS) ×3 IMPLANT
DILATOR CANAL MILEX (MISCELLANEOUS) IMPLANT
DRAPE CHEST BREAST 15X10 FENES (DRAPES) ×3 IMPLANT
ELECT COATED BLADE 2.86 ST (ELECTRODE) ×3 IMPLANT
ELECT REM PT RETURN 9FT ADLT (ELECTROSURGICAL) ×2
ELECTRODE REM PT RTRN 9FT ADLT (ELECTROSURGICAL) ×3 IMPLANT
GLOVE BIO SURGEON STRL SZ7 (GLOVE) ×9 IMPLANT
GLOVE BIOGEL PI IND STRL 7.5 (GLOVE) ×3 IMPLANT
GLOVE SURG UNDER POLY LF SZ7 (GLOVE) ×3 IMPLANT
GOWN STRL REUS W/ TWL LRG LVL3 (GOWN DISPOSABLE) ×12 IMPLANT
GOWN STRL REUS W/TWL LRG LVL3 (GOWN DISPOSABLE) ×8
KIT BASIN OR (CUSTOM PROCEDURE TRAY) ×3 IMPLANT
KIT MARKER MARGIN INK (KITS) ×3 IMPLANT
KIT PROCEDURE FLUENT (KITS) ×3 IMPLANT
LIGHT WAVEGUIDE WIDE FLAT (MISCELLANEOUS) IMPLANT
NDL HYPO 25GX1X1/2 BEV (NEEDLE) ×3 IMPLANT
NEEDLE HYPO 25GX1X1/2 BEV (NEEDLE) ×2
NS IRRIG 1000ML POUR BTL (IV SOLUTION) ×3 IMPLANT
PACK GENERAL/GYN (CUSTOM PROCEDURE TRAY) ×3 IMPLANT
PACK VAGINAL MINOR WOMEN LF (CUSTOM PROCEDURE TRAY) ×3 IMPLANT
PAD OB MATERNITY 4.3X12.25 (PERSONAL CARE ITEMS) ×3 IMPLANT
STRIP CLOSURE SKIN 1/2X4 (GAUZE/BANDAGES/DRESSINGS) ×3 IMPLANT
SUT MNCRL AB 4-0 PS2 18 (SUTURE) ×3 IMPLANT
SUT MON AB 5-0 PS2 18 (SUTURE) IMPLANT
SUT SILK 2 0 SH (SUTURE) IMPLANT
SUT VIC AB 2-0 SH 27 (SUTURE) ×2
SUT VIC AB 2-0 SH 27XBRD (SUTURE) ×3 IMPLANT
SUT VIC AB 3-0 SH 27 (SUTURE) ×2
SUT VIC AB 3-0 SH 27X BRD (SUTURE) ×3 IMPLANT
SYR CONTROL 10ML LL (SYRINGE) ×3 IMPLANT
TOWEL GREEN STERILE (TOWEL DISPOSABLE) ×3 IMPLANT
TOWEL GREEN STERILE FF (TOWEL DISPOSABLE) ×9 IMPLANT

## 2022-12-07 NOTE — Anesthesia Procedure Notes (Signed)
Procedure Name: Intubation Date/Time: 12/07/2022 7:45 AM  Performed by: Loleta Ellijah Leffel, CRNAPre-anesthesia Checklist: Patient identified, Patient being monitored, Timeout performed, Emergency Drugs available and Suction available Patient Re-evaluated:Patient Re-evaluated prior to induction Oxygen Delivery Method: Circle system utilized Preoxygenation: Pre-oxygenation with 100% oxygen Induction Type: IV induction Ventilation: Mask ventilation without difficulty Laryngoscope Size: Mac and 3 Grade View: Grade II Tube type: Oral Tube size: 7.0 mm Number of attempts: 1 Airway Equipment and Method: Stylet Placement Confirmation: ETT inserted through vocal cords under direct vision, positive ETCO2 and breath sounds checked- equal and bilateral Secured at: 22 cm Tube secured with: Tape Dental Injury: Teeth and Oropharynx as per pre-operative assessment  Comments: Attempted LMA x2. Unable to position well.

## 2022-12-07 NOTE — H&P (Addendum)
H&P  Chief complaint : Postmenopausal bleeding , biopsy-proven right breast cancer   HPI: 67 year old female who presents for hysteroscopy, dilation and curettage for postmenopausal bleeding.  The patient was recently diagnosed with right breast cancer and has a scheduled lumpectomy for today.  Around the time of diagnosis of her breast cancer she also had an episode of postmenopausal bleeding.  An ultrasound in the office showed hypoechoic fluid and possible polyp within the endometrial cavity.  Management options were discussed.  There was concern for inadequate sampling of the endometrial cavity with [place]elle endometrial biopsy alone therefore the decision was made to proceed with hysteroscopy D&C for an adequate tissue sample.  Patient was already scheduled for a lumpectomy so the surgical procedure was added on.  Risks/benefits/alternatives of the procedure were discussed with the patient at length and she wishes to proceed.  Vitals:   12/07/22 0546  BP: (!) 155/87  Pulse: 75  Resp: 17  Temp: 98 F (36.7 C)  TempSrc: Oral  SpO2: 97%  Weight: 79.4 kg  Height: 5\' 6"  (1.676 m)   PE: AOx3, NAD Normal WOB Abd Soft  Results for orders placed or performed during the hospital encounter of 12/07/22 (from the past 24 hour(s))  Glucose, capillary     Status: Abnormal   Collection Time: 12/07/22  5:48 AM  Result Value Ref Range   Glucose-Capillary 142 (H) 70 - 99 mg/dL   Assessment and plan:  1) proceed with hysteroscopy D&C following right lumpectomy procedure

## 2022-12-07 NOTE — Transfer of Care (Signed)
Immediate Anesthesia Transfer of Care Note  Patient: [name]  Procedure(s) Performed: RIGHT BREAST LUMPECTOMY WITH RADIOACTIVE SEED LOCALIZATION (Right) DILATATION AND CURETTAGE /HYSTEROSCOPY WITH POLYPECTOMY ENDOMETRIAL BIOPSY  Patient Location: PACU  Anesthesia Type:General  Level of Consciousness: awake  Airway & Oxygen Therapy: Patient Spontanous Breathing  Post-op Assessment: Report given to RN and Post -op Vital signs reviewed and stable  Post vital signs: Reviewed and stable  Last Vitals:  Vitals Value Taken Time  BP 110/59 12/07/22 0934  Temp    Pulse 79 12/07/22 0936  Resp 18 12/07/22 0936  SpO2 92 % 12/07/22 0936  Vitals shown include unfiled device data.  Last Pain:  Vitals:   12/07/22 0639  TempSrc:   PainSc: 0-No pain         Complications: No notable events documented.

## 2022-12-07 NOTE — Op Note (Signed)
Pre-Operative Diagnosis: 1) postmenopausal bleeding Postoperative Diagnosis: 1) postmenopausal bleeding 2) uterine perforation Procedure: Hysteroscopy, dilation and curettage Surgeon: Dr. Waynard Reeds Assistant: None Operative Findings: Atrophic appearing endometrium.  Left tubal ostia visualized.  In the vicinity of the right tubal ostia a uterine perforation was noted.  No significant bleeding was noted in this area.  Fluid deficit for 25 cc Specimen: Endometrial curettings EBL: Total I/O In: 1100 [I.V.:1000; IV Piggyback:100] Out: 400 [Urine:400]   Ms. [name] Is a 67 year old who presents for right breast lumpectomy for biopsy-proven breast cancer.  She also experienced some postmenopausal bleeding and the decision was made to perform a hysteroscopy D&C while she was under anesthesia for the lumpectomy to get a definitive tissue sample. Please see the patient's history and physical for complete details of the history. Management options were discussed with the patient. R/B/A reviewed. Following appropriate informed consent was taken to the operating room. The patient was appropriately identified during a time out procedure. General anesthesia was administered and the patient was placed in the dorsal lithotomy position. The patient was prepped and draped in the normal sterile fashion. A speculum was placed into the vagina, a single-tooth tenaculum was placed on the anterior lip of the cervix, and 10 cc of 1% lidocaine was administered in a paracervical fashion. The cervix was serially dilated with Shawnie Pons dilators.  The hysteroscope was introduced for the above findings.  The hysteroscope was then removed.  A careful sharp curettage was performed.  The hysteroscope was reintroduced to visualize the perforated area no bleeding was noted.  This completed the surgical procedure.  All sponge, needle, instrument counts were correct.  The patient was extubated and transferred to the PACU in stable condition  following the procedure.

## 2022-12-07 NOTE — Op Note (Signed)
Preoperative diagnosis: right breast DCIS Postoperative diagnosis: Same as above Procedure: Right breast seed guided lumpectomy Surgeon: Dr. Harden Mo Anesthesia: General Estimated blood loss: Minimal Complications: None Drains: Specimens: 1.  Right breast tissue containing clip and seed marked with paint 2.  Additional inferior and posterior margins marked short superior, long lateral, double deep Sponge and needle count was correct at completion Disposition recovery stable addition   Indications: 39 yof with screening detected right breast calcifications. She has c density breast tissue. She has history of two sisters with breast cancer diagnosed 18 and 31 who are both deceased. Area was in uoq and measured 1.7 cm. 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 She was found to be CHEK2 positive.  We discussed proceeding with  lumpectomy.  Procedure: After informed consent was obtained she was taken to the operating room.  She was given Ancef.  SCDs were in place.  She was placed under general anesthesia without complication.  She was prepped and draped in the standard sterile surgical fashion.  Surgical timeout was then performed.   I located the seed in very uoq.  I infiltrated marcaine throughout this area and made a curvilinear incision high on the breast.  I then used the neoprobe to remove the seed and the surrounding tissue with an attempt to get a clear margin.  I thought all my margins were clear but on imaging I thought posterior and inferior could be close so I removed these and marked as above. Hemostasis was observed.  I then closed the cavity with 2-0 Vicryl and the skin was closed with 3-0 Vicryl and 4-0 Monocryl.  Glue and Steri-Strips were applied.  She tolerated this well was extubated and transferred recovery stable.

## 2022-12-07 NOTE — Interval H&P Note (Signed)
History and Physical Interval Note:  12/07/2022 7:14 AM  [name]  has presented today for surgery, with the diagnosis of RIGHT BREAST CANCER.  The various methods of treatment have been discussed with the patient and family. After consideration of risks, benefits and other options for treatment, the patient has consented to  Procedure(s): RIGHT BREAST LUMPECTOMY WITH RADIOACTIVE SEED LOCALIZATION (Right) DILATATION AND CURETTAGE /HYSTEROSCOPY WITH POLYPECTOMY ENDOMETRIAL BIOPSY (N/A) as a surgical intervention.  The patient's history has been reviewed, patient examined, no change in status, stable for surgery.  I have reviewed the patient's chart and labs.  Questions were answered to the patient's satisfaction.     Emelia Loron

## 2022-12-07 NOTE — Progress Notes (Signed)
Late entry for 0640  Patient had taken 3 units of Humalog at home.  Spoke to Dr. Bradley Ferris no glycemic protocol initiated.

## 2022-12-07 NOTE — Discharge Instructions (Signed)
[facility] Office Phone Number [phone]  POST OP INSTRUCTIONS Take 400 mg of ibuprofen every 8 hours or 650 mg tylenol every 6 hours for next 72 hours then as needed. Use ice several times daily also.  A prescription for pain medication may be given to you upon discharge.  Take your pain medication as prescribed, if needed.  If narcotic pain medicine is not needed, then you may take acetaminophen (Tylenol), naprosyn (Alleve) or ibuprofen (Advil) as needed. Take your usually prescribed medications unless otherwise directed If you need a refill on your pain medication, please contact your pharmacy.  They will contact our office to request authorization.  Prescriptions will not be filled after 5pm or on week-ends. You should eat very light the first 24 hours after surgery, such as soup, crackers, pudding, etc.  Resume your normal diet the day after surgery. Most patients will experience some swelling and bruising in the breast.  Ice packs and a good support bra will help.  Wear the breast binder provided or a sports bra for 72 hours day and night.  After that wear a sports bra during the day until you return to the office. Swelling and bruising can take several days to resolve.  It is common to experience some constipation if taking pain medication after surgery.  Increasing fluid intake and taking a stool softener will usually help or prevent this problem from occurring.  A mild laxative (Milk of Magnesia or Miralax) should be taken according to package directions if there are no bowel movements after 48 hours. I used skin glue on the incision, you may shower in 24 hours.  The glue will flake off over the next 2-3 weeks.  Any sutures or staples will be removed at the office during your follow-up visit. ACTIVITIES:  You may resume regular daily activities (gradually increasing) beginning the next day.  Wearing a good support bra or sports bra minimizes pain and swelling.  You may have  sexual intercourse when it is comfortable. You may drive when you no longer are taking prescription pain medication, you can comfortably wear a seatbelt, and you can safely maneuver your car and apply brakes. RETURN TO WORK:  ______________________________________________________________________________________ You should see your doctor in the office for a follow-up appointment approximately two weeks after your surgery.  Your doctor's nurse will typically make your follow-up appointment when she calls you with your pathology report.  Expect your pathology report 3-4 business days after your surgery.  You may call to check if you do not hear from us after three days. OTHER INSTRUCTIONS: _______________________________________________________________________________________________ _____________________________________________________________________________________________________________________________________ _____________________________________________________________________________________________________________________________________ _____________________________________________________________________________________________________________________________________  WHEN TO CALL DR WAKEFIELD: Fever over 101.0 Nausea and/or vomiting. Extreme swelling or bruising. Continued bleeding from incision. Increased pain, redness, or drainage from the incision.  The clinic staff is available to answer your questions during regular business hours.  Please don't hesitate to call and ask to speak to one of the nurses for clinical concerns.  If you have a medical emergency, go to the nearest emergency room or call 911.  A surgeon from [facility] is always on call at the hospital.  For further questions, please visit [email or web address]  

## 2022-12-08 ENCOUNTER — Encounter (HOSPITAL_COMMUNITY): Payer: Self-pay | Admitting: General Surgery

## 2022-12-08 NOTE — Anesthesia Postprocedure Evaluation (Signed)
Anesthesia Post Note  Patient: [name]  Procedure(s) Performed: RIGHT BREAST LUMPECTOMY WITH RADIOACTIVE SEED LOCALIZATION (Right) DILATATION AND CURETTAGE /HYSTEROSCOPY WITH POLYPECTOMY ENDOMETRIAL BIOPSY     Patient location during evaluation: PACU Anesthesia Type: General Level of consciousness: awake Pain management: pain level controlled Vital Signs Assessment: post-procedure vital signs reviewed and stable Respiratory status: spontaneous breathing, nonlabored ventilation and respiratory function stable Cardiovascular status: blood pressure returned to baseline and stable Postop Assessment: no apparent nausea or vomiting Anesthetic complications: no   No notable events documented.  Last Vitals:  Vitals:   12/07/22 1000 12/07/22 1015  BP: 110/70 122/71  Pulse: 69 70  Resp: 14 14  Temp:  36.6 C  SpO2: 97% 94%    Last Pain:  Vitals:   12/07/22 1000  TempSrc:   PainSc: 4                  Anoop Hemmer P Keneth Borg

## 2022-12-13 ENCOUNTER — Encounter: Payer: Self-pay | Admitting: *Deleted

## 2022-12-20 ENCOUNTER — Encounter: Payer: Self-pay | Admitting: Hematology

## 2022-12-20 ENCOUNTER — Inpatient Hospital Stay: Payer: Medicare Other | Attending: Hematology | Admitting: Hematology

## 2022-12-20 VITALS — BP 144/77 | HR 86 | Temp 98.1°F | Resp 18 | Ht 69.0 in | Wt 179.1 lb

## 2022-12-20 DIAGNOSIS — D0511 Intraductal carcinoma in situ of right breast: Secondary | ICD-10-CM | POA: Insufficient documentation

## 2022-12-20 DIAGNOSIS — I10 Essential (primary) hypertension: Secondary | ICD-10-CM | POA: Insufficient documentation

## 2022-12-20 DIAGNOSIS — E1159 Type 2 diabetes mellitus with other circulatory complications: Secondary | ICD-10-CM | POA: Diagnosis not present

## 2022-12-20 DIAGNOSIS — C50411 Malignant neoplasm of upper-outer quadrant of right female breast: Secondary | ICD-10-CM

## 2022-12-20 DIAGNOSIS — Z79899 Other long term (current) drug therapy: Secondary | ICD-10-CM | POA: Diagnosis not present

## 2022-12-20 DIAGNOSIS — Z170 Estrogen receptor positive status [ER+]: Secondary | ICD-10-CM | POA: Diagnosis not present

## 2022-12-20 NOTE — Progress Notes (Signed)
START ON PATHWAY REGIMEN - Breast     Cycle 1: A cycle is 7 days:     Trastuzumab-xxxx      Paclitaxel    Cycles 2 through 12: A cycle is every 7 days:     Trastuzumab-xxxx      Paclitaxel    Cycles 13 through 25: A cycle is every 21 days:     Trastuzumab-xxxx   **Always confirm dose/schedule in your pharmacy ordering system**  Patient Characteristics: Postoperative without Neoadjuvant Therapy, M0 (Pathologic Staging), Invasive Disease, Adjuvant Therapy, HER2 Positive, ER Positive, Node Negative, pT1a, pN0/N25mi, Chemotherapy Indicated Therapeutic Status: Postoperative without Neoadjuvant Therapy, M0 (Pathologic Staging) AJCC Grade: G2 AJCC N Category: pN0 AJCC M Category: cM0 ER Status: Positive (+) AJCC 8 Stage Grouping: IA HER2 Status: Positive (+) Oncotype Dx Recurrence Score: Not Appropriate AJCC T Category: pT1a PR Status: Negative (-) Intervention Indicated: Chemotherapy Intent of Therapy: Curative Intent, Discussed with Patient

## 2022-12-20 NOTE — Progress Notes (Signed)
[facility]   [phone] [phone]   Clinic Follow up Note   Patient Care Team: Tisovec, Adelfa Koh, MD as PCP - General (Internal Medicine) Iran Ouch, MD as PCP - Cardiology (Cardiology) Malachy Mood, MD as Consulting Physician (Hematology) Emelia Loron, MD as Consulting Physician (General Surgery) Dorothy Puffer, MD as Consulting Physician (Radiation Oncology) Pershing Proud, RN as Oncology Nurse Navigator Donnelly Angelica, RN as Oncology Nurse Navigator  Date of Service:  12/20/2022  CHIEF COMPLAINT: f/u of right breast cancer  CURRENT THERAPY:  Pending adjuvant chemotherapy with weekly Taxol or Abraxane, and trastuzumab   Assessment and Plan    Malignant neoplasm of upper-outer quadrant of right breast, pT1aN0M0, stage IA, G2, HER2 positive, ER positive, PR negative, and DCIS(+) It was discovered on screening mammogram, initial biopsy showed DCIS only.   -Status post a right lumpectomy.  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 Margins are negative for invasive cancer, but DCIS margin was close. No lymphovascular or perineural invasion. Lymph node biopsy was not performed, Dr. Dwain Sarna has ordered an ultrasound of the right axilla for further evaluation.  If ultrasound is negative, I think it is okay not to have sentinel lymph node biopsy given the small primary tumor. -Given the aggressive nature of her HER2 positive disease, and moderate risk of recurrence, I do recommend adjuvant chemotherapy with Taxol or Abraxane once weekly for 12 weeks.  We also discussed the alternative of high dose every 3 weeks for 4 treatments. -HER2 antibody therapy to be given concurrently with chemotherapy, then every three weeks for a total of one year. --Chemotherapy consent: Side effects including but does not not limited to, fatigue, nausea, vomiting,  diarrhea, hair loss, neuropathy, fluid retention, renal and kidney dysfunction, neutropenic fever, needed for blood transfusion, bleeding, were discussed with patient in great detail. She agrees to proceed. -The goal of chemotherapy is curative. -Consider port placement for chemotherapy administration. -Will obtain a baseline echocardiogram, and monitor every 3 months. -Consider use of cold cap to prevent hair loss during chemotherapy. She is interested  -After chemotherapy, plan for radiation therapy. -Post-radiation, initiate anti-estrogen therapy (Tamoxifen or Anastrozole). -We also discussed cancer surveillance after cancer treatment.  Diabetes Well-controlled with recent A1C below 7. -Monitor blood sugar levels during chemotherapy as they may increase.  Hypertension Medication controlled with Metoprolol. -Monitor blood pressure during chemotherapy as it may decrease.  Plan -Coordinate with Dr. Dwain Sarna for ultrasound and decide if she will need sentinel lymph node biopsy. -Coordinate with Dr. Dwain Sarna for port placement. -Check insurance coverage for Abraxane and trastuzumab, plan to start the week of October 7 -Consider use of cold cap during chemotherapy to prevent hair loss.  -Echocardiogram in the next few weeks, and chemo class.       SUMMARY OF ONCOLOGIC HISTORY: Oncology History  Malignant neoplasm of upper-outer quadrant of right breast in female, estrogen receptor positive (HCC)  10/27/2022 Cancer Staging   Staging form: Breast, AJCC 8th Edition - Clinical stage from 10/27/2022: Stage 0 (cTis (DCIS), cN0, cM0, G3, ER+, PR-, HER2: Not Assessed) - Signed by Malachy Mood, MD on 11/03/2022 Stage prefix: Initial diagnosis Histologic grading system: 3 grade system   11/01/2022 Initial Diagnosis   Ductal carcinoma in situ (DCIS) of right breast   12/07/2022 Cancer Staging   Staging form: Breast, AJCC 8th Edition - Pathologic stage from 12/07/2022: Stage IA (  pT1a, pN0, cM0, G2,  ER+, PR-, HER2+) - Signed by Malachy Mood, MD on 12/20/2022 Stage prefix: Initial diagnosis Histologic grading system: 3 grade system Residual tumor (R): R0 - None      Discussed the use of AI scribe software for clinical note transcription with the patient, who gave verbal consent to proceed.  History of Present Illness   The patient, with a history of diabetes, recently underwent surgery for breast cancer. The pathology results revealed a small 5mm invasive HER2 positive tumor and high-grade ductal carcinoma in situ (DCIS). The patient reports that the surgery went well with no complications and she has recovered well in the two weeks since the procedure. The patient's spouse is present during the consultation and is actively involved in the discussion about the patient's treatment plan. The patient also has a family history of breast cancer, with two sisters who died from the disease.         All other systems were reviewed with the patient and are negative.  MEDICAL HISTORY:  Past Medical History:  Diagnosis Date   Anemia    Arthritis    Atypical chest pain    a. 05/2018 CTA chest: No PE; b. 05/2018 MV: No ischemia/scar. EF 65%.   Breast cancer [facility])    Coronary artery calcification seen on CT scan    a. 05/2018 CT Chest: Coronary and Ao atherosclerotic Calcifications.   Diabetes mellitus without complication (HCC)    Diastolic dysfunction    a. 05/2018 Echo: EF 60-65%, impaired relaxation. Nl RVSP.   Dysrhythmia    PVC's   Family history of adverse reaction to anesthesia    sister-malignant hyperthermia   FH of Malignant hyperthermia    a. sister had malginant hyperthermia   History of kidney stones    History of renal insufficiency    History of skin cancer    Hyperlipemia    Osteoporosis    Pulmonary nodules    a. 05/2018 CT Chest: Small pulm nodules measuring up to 6mm avg diameter. Rec non-contrast Chest CT in 6-12 mos.   Thyroid nodule     SURGICAL HISTORY: Past  Surgical History:  Procedure Laterality Date   BREAST BIOPSY Right 10/27/2022   MM RT BREAST BX W LOC DEV 1ST LESION IMAGE BX SPEC STEREO GUIDE 10/27/2022 [facility]   BREAST BIOPSY  12/06/2022   MM RT RADIOACTIVE SEED LOC MAMMO GUIDE 12/06/2022 [facility]   BREAST LUMPECTOMY WITH RADIOACTIVE SEED LOCALIZATION Right 12/07/2022   Procedure: RIGHT BREAST LUMPECTOMY WITH RADIOACTIVE SEED LOCALIZATION;  Surgeon: Emelia Loron, MD;  Location: [facility] OR;  Service: General;  Laterality: Right;   CESAREAN SECTION     CHOLECYSTECTOMY  2006   lap choli with umb hernia   COLONOSCOPY     HYSTEROSCOPY WITH D & C N/A 12/07/2022   Procedure: DILATATION AND CURETTAGE /HYSTEROSCOPY WITH POLYPECTOMY ENDOMETRIAL BIOPSY;  Surgeon: Waynard Reeds, MD;  Location: [facility] OR;  Service: Gynecology;  Laterality: N/A;   LIPOMA EXCISION Left 03/15/2014   Procedure: EXCISION LIPOMA LEFT LOWER QUARDRANT ABDOMINAL WALL;  Surgeon: Violeta Gelinas, MD;  Location: [facility];  Service: General;  Laterality: Left;   THYROIDECTOMY N/A 06/25/2019   Procedure: TOTAL THYROIDECTOMY;  Surgeon: Darnell Level, MD;  Location: [facility] ORS;  Service: General;  Laterality: N/A;   TONSILLECTOMY     TRIGGER FINGER RELEASE  2012   lt small,middle,long   TRIGGER FINGER RELEASE Right 07/14/2017   Procedure: RELEASE TRIGGER FINGER/A-1 PULLEY RIGHT THUMB;  Surgeon: Cindee Salt, MD;  Location:  SURGERY CENTER;  Service: Orthopedics;  Laterality: Right;   UMBILICAL HERNIA REPAIR  2006   with lap choli    I have reviewed the social history and family history with the patient and they are unchanged from previous note.  ALLERGIES:  has No Known Allergies.  MEDICATIONS:  Current Outpatient Medications  Medication Sig Dispense Refill   aspirin EC 81 MG tablet Take 81 mg by mouth daily.     Insulin Glargine (BASAGLAR KWIKPEN) 100 UNIT/ML SOPN Inject 35 Units into the skin at bedtime.     insulin lispro (HUMALOG) 100  UNIT/ML injection Inject 10-15 Units into the skin 3 (three) times daily before meals. Sliding scale     levothyroxine (SYNTHROID) 112 MCG tablet Take 112 mcg by mouth daily before breakfast.     metFORMIN (GLUCOPHAGE) 1000 MG tablet Take 1,000 mg by mouth 2 (two) times daily with a meal.     metoprolol succinate (TOPROL-XL) 25 MG 24 hr tablet TAKE 1 TABLET (25 MG TOTAL) BY MOUTH DAILY. 90 tablet 0   nitrofurantoin (MACRODANTIN) 100 MG capsule Take 100 mg by mouth daily.     Nutritional Supplements (JUICE PLUS FIBRE PO) Take 4 tablets by mouth in the morning and at bedtime. Fruit Blend (2) + Vegetable Blend (2)     ramipril (ALTACE) 5 MG capsule Take 5 mg by mouth daily with breakfast.     rosuvastatin (CRESTOR) 20 MG tablet Take 20 mg by mouth daily with supper.      triamcinolone cream (KENALOG) 0.1 % Apply 1 Application topically daily as needed (dermatitis).     vitamin C (ASCORBIC ACID) 250 MG tablet Take 250 mg by mouth daily.     No current facility-administered medications for this visit.    PHYSICAL EXAMINATION: ECOG PERFORMANCE STATUS: 0 - Asymptomatic  Vitals:   12/20/22 1521  BP: (!) 144/77  Pulse: 86  Resp: 18  Temp: 98.1 F (36.7 C)  SpO2: 98%   Wt Readings from Last 3 Encounters:  12/20/22 179 lb 1.6 oz (81.2 kg)  12/07/22 175 lb (79.4 kg)  12/01/22 176 lb 12.8 oz (80.2 kg)     GENERAL:alert, no distress and comfortable SKIN: skin color, texture, turgor are normal, no rashes or significant lesions EYES: normal, Conjunctiva are pink and non-injected, sclera clear NECK: supple, thyroid normal size, non-tender, without nodularity LYMPH:  no palpable lymphadenopathy in the cervical, axillary  LUNGS: clear to auscultation and percussion with normal breathing effort HEART: regular rate & rhythm and no murmurs and no lower extremity edema ABDOMEN:abdomen soft, non-tender and normal bowel sounds Musculoskeletal:no cyanosis of digits and no clubbing  NEURO: alert &  oriented x 3 with fluent speech, no focal motor/sensory deficits Breast exam showed well-healed incision in the right upper outer quadrant of right breast,  Physical Exam          LABORATORY DATA:  I have reviewed the data as listed    Latest Ref Rng & Units 12/01/2022    9:30 AM 11/03/2022    8:17 AM 06/19/2019    3:50 PM  CBC  WBC 4.0 - 10.5 K/uL 6.0  5.4  7.8   Hemoglobin 12.0 - 15.0 g/dL 40.1  02.7  25.3   Hematocrit 36.0 - 46.0 % 43.0  41.7  42.8   Platelets 150 - 400 K/uL 314  299  370         Latest Ref Rng & Units 12/01/2022  9:30 AM 11/03/2022    8:17 AM 06/26/2019    5:00 AM  CMP  Glucose 70 - 99 mg/dL 119  147  829   BUN 8 - 23 mg/dL 8  9  12    Creatinine 0.44 - 1.00 mg/dL 5.62  1.30  8.65   Sodium 135 - 145 mmol/L 135  137  133   Potassium 3.5 - 5.1 mmol/L 3.9  3.4  3.9   Chloride 98 - 111 mmol/L 98  99  97   CO2 22 - 32 mmol/L 27  25  24    Calcium 8.9 - 10.3 mg/dL 9.7  9.4  9.1   Total Protein 6.5 - 8.1 g/dL  7.4    Total Bilirubin 0.3 - 1.2 mg/dL  0.5    Alkaline Phos 38 - 126 U/L  68    AST 15 - 41 U/L  11    ALT 0 - 44 U/L  14        RADIOGRAPHIC STUDIES: I have personally reviewed the radiological images as listed and agreed with the findings in the report. No results found.    No orders of the defined types were placed in this encounter.  All questions were answered. The patient knows to call the clinic with any problems, questions or concerns. No barriers to learning was detected. The total time spent in the appointment was 45 minutes.     Malachy Mood, MD 12/20/2022

## 2022-12-21 ENCOUNTER — Other Ambulatory Visit: Payer: Self-pay | Admitting: General Surgery

## 2022-12-21 DIAGNOSIS — D0511 Intraductal carcinoma in situ of right breast: Secondary | ICD-10-CM

## 2022-12-22 ENCOUNTER — Telehealth: Payer: Self-pay | Admitting: *Deleted

## 2022-12-22 NOTE — Telephone Encounter (Signed)
FISH testing ordered on final surgical specimen per Drs Mosetta Putt and Dwain Sarna.

## 2022-12-23 ENCOUNTER — Ambulatory Visit: Payer: Medicare Other | Attending: Cardiology

## 2022-12-23 ENCOUNTER — Other Ambulatory Visit: Payer: Self-pay | Admitting: Hematology

## 2022-12-23 ENCOUNTER — Ambulatory Visit: Payer: Medicare Other | Admitting: Radiation Oncology

## 2022-12-23 DIAGNOSIS — I5030 Unspecified diastolic (congestive) heart failure: Secondary | ICD-10-CM

## 2022-12-23 DIAGNOSIS — R079 Chest pain, unspecified: Secondary | ICD-10-CM

## 2022-12-23 DIAGNOSIS — C50411 Malignant neoplasm of upper-outer quadrant of right female breast: Secondary | ICD-10-CM

## 2022-12-23 DIAGNOSIS — Z170 Estrogen receptor positive status [ER+]: Secondary | ICD-10-CM

## 2022-12-23 DIAGNOSIS — I7121 Aneurysm of the ascending aorta, without rupture: Secondary | ICD-10-CM | POA: Diagnosis not present

## 2022-12-23 DIAGNOSIS — I083 Combined rheumatic disorders of mitral, aortic and tricuspid valves: Secondary | ICD-10-CM

## 2022-12-24 ENCOUNTER — Telehealth: Payer: Self-pay | Admitting: *Deleted

## 2022-12-24 LAB — ECHOCARDIOGRAM COMPLETE
AR max vel: 1.89 cm2
AV Area VTI: 2 cm2
AV Area mean vel: 1.91 cm2
AV Mean grad: 5.3 mm[Hg]
AV Peak grad: 9.2 mm[Hg]
Ao pk vel: 1.52 m/s
Area-P 1/2: 4.39 cm2
Calc EF: 38.8 %
S' Lateral: 4.8 cm
Single Plane A2C EF: 44.6 %
Single Plane A4C EF: 32.2 %

## 2022-12-24 NOTE — Telephone Encounter (Signed)
Spoke to pt concerning timing of hair loss as well as wig/turban salons. Pt will call dignicap. After she and her husband have made a final decision, she will call with the decision. No further needs voiced at this time.

## 2022-12-24 NOTE — Telephone Encounter (Signed)
Spoke with patient regarding dignicap and next steps for ordering delta kit and card.  Gave support phone # as well.

## 2022-12-27 ENCOUNTER — Other Ambulatory Visit: Payer: Medicare Other

## 2022-12-28 ENCOUNTER — Encounter: Payer: Self-pay | Admitting: Hematology

## 2022-12-28 ENCOUNTER — Encounter: Payer: Self-pay | Admitting: *Deleted

## 2022-12-28 ENCOUNTER — Telehealth: Payer: Self-pay | Admitting: *Deleted

## 2022-12-28 ENCOUNTER — Ambulatory Visit
Admission: RE | Admit: 2022-12-28 | Discharge: 2022-12-28 | Disposition: A | Discharge status: Home / Self Care | Payer: Medicare Other | Source: Ambulatory Visit | Attending: General Surgery | Admitting: General Surgery

## 2022-12-28 ENCOUNTER — Other Ambulatory Visit: Payer: Self-pay | Admitting: Hematology

## 2022-12-28 DIAGNOSIS — C50411 Malignant neoplasm of upper-outer quadrant of right female breast: Secondary | ICD-10-CM

## 2022-12-28 DIAGNOSIS — D0511 Intraductal carcinoma in situ of right breast: Secondary | ICD-10-CM

## 2022-12-28 MED ORDER — LIDOCAINE-PRILOCAINE 2.5-2.5 % EX CREA
TOPICAL_CREAM | CUTANEOUS | 3 refills | Status: DC
Start: 2022-12-28 — End: 2023-09-20

## 2022-12-28 MED ORDER — ONDANSETRON HCL 8 MG PO TABS: 8.0000 mg | ORAL_TABLET | Freq: Three times a day (TID) | ORAL | 1 refills | Status: DC | PRN

## 2022-12-28 MED ORDER — PROCHLORPERAZINE MALEATE 10 MG PO TABS: 10.0000 mg | ORAL_TABLET | Freq: Four times a day (QID) | ORAL | 1 refills | Status: DC | PRN

## 2022-12-28 NOTE — Telephone Encounter (Signed)
Spoke to pt concerning cold cap therapy. Pt has decided to forgo cold cap. Reached out to [facility] regarding wig salon. Nickie will call pt to schedule appt.  Msg sent to physician team regarding LN [place] negative. Pt ready to move forward with port placement and chemo.  No further questions voiced at this time.

## 2022-12-29 ENCOUNTER — Other Ambulatory Visit: Payer: Self-pay | Admitting: General Surgery

## 2022-12-29 ENCOUNTER — Other Ambulatory Visit: Payer: Self-pay

## 2022-12-30 ENCOUNTER — Telehealth: Payer: Self-pay | Admitting: Hematology

## 2022-12-30 ENCOUNTER — Other Ambulatory Visit: Payer: Self-pay

## 2022-12-31 ENCOUNTER — Other Ambulatory Visit: Payer: Self-pay

## 2022-12-31 ENCOUNTER — Telehealth: Payer: Self-pay | Admitting: Hematology

## 2022-12-31 ENCOUNTER — Telehealth: Payer: Self-pay

## 2022-12-31 ENCOUNTER — Telehealth: Payer: Self-pay | Admitting: *Deleted

## 2022-12-31 NOTE — Telephone Encounter (Signed)
Left detailed vm on verified vm informing pt will start chemo after she returns from her cruise. Informed Dr. Mosetta Putt sent reassurance that this is ok per Dr. Mosetta Putt as well as pt needs to know she needs to heal from her surgery prior to starting chemo. Contact information provided for questions or needs.

## 2022-12-31 NOTE — Telephone Encounter (Signed)
LVM message for pt in response to pt's call on 12/30/2022.  Informed pt that Dr. Mosetta Putt confirmed that the pt will NOT start chemo on 01/03/2023 but will start after she has completed her surgery.  Stated that Dr. Mosetta Putt will see the pt back in clinic 1 wk after her surgery at which time they will discuss pt's tx plan.  Stated Dr. Mosetta Putt stated the pt will then be scheduled to start chemo 1 wk after the f/u OV.  Instructed pt to contact Dr. Latanya Maudlin office should she have additional questions or concerns.

## 2023-01-01 ENCOUNTER — Other Ambulatory Visit: Payer: Self-pay

## 2023-01-03 ENCOUNTER — Other Ambulatory Visit: Payer: Medicare Other

## 2023-01-03 ENCOUNTER — Encounter: Payer: Self-pay | Admitting: *Deleted

## 2023-01-03 ENCOUNTER — Other Ambulatory Visit: Payer: Self-pay | Admitting: Hematology

## 2023-01-03 ENCOUNTER — Other Ambulatory Visit: Payer: Self-pay

## 2023-01-03 ENCOUNTER — Ambulatory Visit: Payer: Medicare Other | Admitting: Pharmacist

## 2023-01-04 ENCOUNTER — Telehealth: Payer: Self-pay | Admitting: Hematology

## 2023-01-04 ENCOUNTER — Telehealth: Payer: Self-pay | Admitting: *Deleted

## 2023-01-04 LAB — SURGICAL PATHOLOGY

## 2023-01-04 NOTE — Telephone Encounter (Signed)
Pt called to r/s post op with Dr. Mosetta Putt as she will be out of town. Scheduled and confirm new appt for 01/20/23 at 10:20 to discuss pathology from [facility] bx and chemo cplan. No further needs voiced at this time.

## 2023-01-05 ENCOUNTER — Other Ambulatory Visit: Payer: Self-pay

## 2023-01-06 ENCOUNTER — Telehealth: Payer: Self-pay | Admitting: Hematology

## 2023-01-06 NOTE — Pre-Procedure Instructions (Signed)
Surgical Instructions   Your procedure is scheduled on January 12, 2023. Report to [facility] Main Entrance "A" at 6:30 A.M., then check in with the Admitting office. Any questions or running late day of surgery: call [phone]  Questions prior to your surgery date: call [phone], Monday-Friday, 8am-4pm. If you experience any cold or flu symptoms such as cough, fever, chills, shortness of breath, etc. between now and your scheduled surgery, please notify us at the above number.     Remember:  Do not eat after midnight the night before your surgery   You may drink clear liquids until 5:30 AM the morning of your surgery.   Clear liquids allowed are: Water, Non-Citrus Juices (without pulp), Carbonated Beverages, Clear Tea, Black Coffee Only (NO MILK, CREAM OR POWDERED CREAMER of any kind), and Gatorade.  Patient Instructions  The night before surgery:  No food after midnight. ONLY clear liquids after midnight  The day of surgery (if you have diabetes): Drink ONE (1) 12 oz G2 given to you in your pre admission testing appointment by 5:30 AM the morning of surgery. Drink in one sitting. Do not sip.  This drink was given to you during your hospital  pre-op appointment visit.  Nothing else to drink after completing the  12 oz bottle of G2.         If you have questions, please contact your surgeon's office.     Take these medicines the morning of surgery with A SIP OF WATER: levothyroxine (SYNTHROID)  nitrofurantoin (MACRODANTIN)   May take these medicines IF NEEDED: ondansetron (ZOFRAN)    Follow your surgeon's instructions on when to stop Asprin.  If no instructions were given by your surgeon then you will need to call the office to get those instructions.     One week prior to surgery, STOP taking any Aleve, Naproxen, Ibuprofen, Motrin, Advil, Goody's, BC's, all herbal medications, fish oil, and non-prescription vitamins.   WHAT DO I DO ABOUT MY DIABETES  MEDICATION?   Do not take metFORMIN (GLUCOPHAGE) the morning of surgery.  THE NIGHT BEFORE SURGERY, take 17 units of Insulin Glargine [facility]).      If your CBG is greater than 220 mg/dL, you may take  of your insulin lispro (HUMALOG).   HOW TO MANAGE YOUR DIABETES BEFORE AND AFTER SURGERY  Why is it important to control my blood sugar before and after surgery? Improving blood sugar levels before and after surgery helps healing and can limit problems. A way of improving blood sugar control is eating a healthy diet by:  Eating less sugar and carbohydrates  Increasing activity/exercise  Talking with your doctor about reaching your blood sugar goals High blood sugars (greater than 180 mg/dL) can raise your risk of infections and slow your recovery, so you will need to focus on controlling your diabetes during the weeks before surgery. Make sure that the doctor who takes care of your diabetes knows about your planned surgery including the date and location.  How do I manage my blood sugar before surgery? Check your blood sugar at least 4 times a day, starting 2 days before surgery, to make sure that the level is not too high or low.  Check your blood sugar the morning of your surgery when you wake up and every 2 hours until you get to the Short Stay unit.  If your blood sugar is less than 70 mg/dL, you will need to treat for low blood sugar: Do not take insulin.  Treat a low blood sugar (less than 70 mg/dL) with  cup of clear juice (cranberry or apple), 4 glucose tablets, OR glucose gel. Recheck blood sugar in 15 minutes after treatment (to make sure it is greater than 70 mg/dL). If your blood sugar is not greater than 70 mg/dL on recheck, call [phone] for further instructions. Report your blood sugar to the short stay nurse when you get to Short Stay.  If you are admitted to the hospital after surgery: Your blood sugar will be checked by the staff and you will probably  be given insulin after surgery (instead of oral diabetes medicines) to make sure you have good blood sugar levels. The goal for blood sugar control after surgery is 80-180 mg/dL.                      Do NOT Smoke (Tobacco/Vaping) for 24 hours prior to your procedure.  If you use a CPAP at night, you may bring your mask/headgear for your overnight stay.   You will be asked to remove any contacts, glasses, piercing's, hearing aid's, dentures/partials prior to surgery. Please bring cases for these items if needed.    Patients discharged the day of surgery will not be allowed to drive home, and someone needs to stay with them for 24 hours.  SURGICAL WAITING ROOM VISITATION Patients may have no more than 2 support people in the waiting area - these visitors may rotate.   Pre-op nurse will coordinate an appropriate time for 1 ADULT support person, who may not rotate, to accompany patient in pre-op.  Children under the age of 30 must have an adult with them who is not the patient and must remain in the main waiting area with an adult.  If the patient needs to stay at the hospital during part of their recovery, the visitor guidelines for inpatient rooms apply.  Please refer to the [facility] website for the visitor guidelines for any additional information.   If you received a COVID test during your pre-op visit  it is requested that you wear a mask when out in public, stay away from anyone that may not be feeling well and notify your surgeon if you develop symptoms. If you have been in contact with anyone that has tested positive in the last 10 days please notify you surgeon.      Pre-operative CHG Bathing Instructions   You can play a key role in reducing the risk of infection after surgery. Your skin needs to be as free of germs as possible. You can reduce the number of germs on your skin by washing with CHG (chlorhexidine gluconate) soap before surgery. CHG is an antiseptic soap that kills  germs and continues to kill germs even after washing.   DO NOT use if you have an allergy to chlorhexidine/CHG or antibacterial soaps. If your skin becomes reddened or irritated, stop using the CHG and notify one of our RNs at [phone].              TAKE A SHOWER THE NIGHT BEFORE SURGERY AND THE DAY OF SURGERY    Please keep in mind the following:  DO NOT shave, including legs and underarms, 48 hours prior to surgery.   You may shave your face before/day of surgery.  Place clean sheets on your bed the night before surgery Use a clean washcloth (not used since being washed) for each shower. DO NOT sleep with pet's night before surgery.  CHG Shower Instructions:  Wash your face and private area with normal soap. If you choose to wash your hair, wash first with your normal shampoo.  After you use shampoo/soap, rinse your hair and body thoroughly to remove shampoo/soap residue.  Turn the water OFF and apply half the bottle of CHG soap to a CLEAN washcloth.  Apply CHG soap ONLY FROM YOUR NECK DOWN TO YOUR TOES (washing for 3-5 minutes)  DO NOT use CHG soap on face, private areas, open wounds, or sores.  Pay special attention to the area where your surgery is being performed.  If you are having back surgery, having someone wash your back for you may be helpful. Wait 2 minutes after CHG soap is applied, then you may rinse off the CHG soap.  Pat dry with a clean towel  Put on clean pajamas    Additional instructions for the day of surgery: DO NOT APPLY any lotions, deodorants, cologne, or perfumes.   Do not wear jewelry or makeup Do not wear nail polish, gel polish, artificial nails, or any other type of covering on natural nails (fingers and toes) Do not bring valuables to the hospital. [facility] is not responsible for valuables/personal belongings. Put on clean/comfortable clothes.  Please brush your teeth.  Ask your nurse before applying any prescription medications to the  skin.

## 2023-01-07 ENCOUNTER — Encounter (HOSPITAL_COMMUNITY)
Admission: RE | Admit: 2023-01-07 | Discharge: 2023-01-07 | Disposition: A | Discharge status: Home / Self Care | Payer: Medicare Other | Source: Ambulatory Visit | Attending: General Surgery | Admitting: General Surgery

## 2023-01-07 ENCOUNTER — Encounter (HOSPITAL_COMMUNITY): Payer: Self-pay

## 2023-01-07 ENCOUNTER — Other Ambulatory Visit: Payer: Self-pay

## 2023-01-07 VITALS — BP 112/66 | HR 86 | Temp 98.3°F | Resp 17 | Ht 69.0 in | Wt 177.3 lb

## 2023-01-07 DIAGNOSIS — Z01812 Encounter for preprocedural laboratory examination: Secondary | ICD-10-CM | POA: Diagnosis present

## 2023-01-07 DIAGNOSIS — Z794 Long term (current) use of insulin: Secondary | ICD-10-CM | POA: Diagnosis not present

## 2023-01-07 DIAGNOSIS — E119 Type 2 diabetes mellitus without complications: Secondary | ICD-10-CM | POA: Insufficient documentation

## 2023-01-07 LAB — CBC
HCT: 43 % (ref 36.0–46.0)
Hemoglobin: 14.1 g/dL (ref 12.0–15.0)
MCH: 28.8 pg (ref 26.0–34.0)
MCHC: 32.8 g/dL (ref 30.0–36.0)
MCV: 87.9 fL (ref 80.0–100.0)
Platelets: 338 10*3/uL (ref 150–400)
RBC: 4.89 MIL/uL (ref 3.87–5.11)
RDW: 12.3 % (ref 11.5–15.5)
WBC: 8.7 10*3/uL (ref 4.0–10.5)
nRBC: 0 % (ref 0.0–0.2)

## 2023-01-07 LAB — BASIC METABOLIC PANEL
Anion gap: 12 (ref 5–15)
BUN: 6 mg/dL — ABNORMAL LOW (ref 8–23)
CO2: 25 mmol/L (ref 22–32)
Calcium: 9.6 mg/dL (ref 8.9–10.3)
Chloride: 100 mmol/L (ref 98–111)
Creatinine, Ser: 0.77 mg/dL (ref 0.44–1.00)
GFR, Estimated: >60 mL/min (ref >60)
Glucose, Bld: 125 mg/dL — ABNORMAL HIGH (ref 70–99)
Potassium: 4.4 mmol/L (ref 3.5–5.1)
Sodium: 137 mmol/L (ref 135–145)

## 2023-01-07 LAB — GLUCOSE, CAPILLARY: Glucose-Capillary: 160 mg/dL — ABNORMAL HIGH (ref 70–99)

## 2023-01-07 NOTE — Progress Notes (Signed)
PCP - Dr. Guerry Bruin Cardiologist - Dr. Lorine Bears Oncologist- Dr. Malachy Mood Urologist- Dr. Tawanna Cooler McDiarmid  PPM/ICD - Denies Device Orders - n/a Rep Notified - n/a  Chest x-ray - Denies EKG - 03/08/2022 Stress Test - 06/16/2018 ECHO - 12/23/2022 - part of chemotherapy work-up Cardiac Cath - Denies  Sleep Study - Denies CPAP - n/a  Pt is DM2. She checks her blood sugar 2-3x/day. Normal fasting range is 80-90. CBG at pre-op 160. A1c 7.0 on 12/01/2022  Last dose of GLP1 agonist- n/a GLP1 instructions: n/a  Blood Thinner Instructions: n/a Aspirin Instructions: Pt said she will stop taking her ASA today. Last dose 10/11  ERAS Protcol - Clear liquids until 0530 morning of surgery PRE-SURGERY Ensure or G2- G2 given to pt with instructions  COVID TEST- n/a   Anesthesia review: Yes. Hx of sister with malignant hyperthermia. Pt has not been tested for this. She had lumpectomy done 12/07/2022 with no complications   Patient denies shortness of breath, fever, cough and chest pain at PAT appointment. Pt denies any respiratory illness/infection in the last two months.    All instructions explained to the patient, with a verbal understanding of the material. Patient agrees to go over the instructions while at home for a better understanding. Patient also instructed to self quarantine after being tested for COVID-19. The opportunity to ask questions was provided.

## 2023-01-08 ENCOUNTER — Other Ambulatory Visit: Payer: Self-pay

## 2023-01-10 ENCOUNTER — Ambulatory Visit: Payer: Medicare Other | Admitting: Pharmacist

## 2023-01-10 ENCOUNTER — Inpatient Hospital Stay: Payer: Medicare Other

## 2023-01-10 NOTE — Anesthesia Preprocedure Evaluation (Addendum)
Anesthesia Evaluation  Patient identified by MRN, date of birth, ID band Patient awake    Reviewed: Allergy & Precautions, NPO status , Patient's Chart, lab work & pertinent test results, reviewed documented beta blocker date and time   History of Anesthesia Complications (+) MALIGNANT HYPERTHERMIA, Family history of anesthesia reaction and history of anesthetic complications (sister with history of MH)  Airway Mallampati: II  TM Distance: >3 FB Neck ROM: Full    Dental no notable dental hx.    Pulmonary neg pulmonary ROS   Pulmonary exam normal        Cardiovascular hypertension, Pt. on medications and Pt. on home beta blockers + CAD  Normal cardiovascular exam     Neuro/Psych negative neurological ROS     GI/Hepatic negative GI ROS, Neg liver ROS,,,  Endo/Other  diabetes, Type 2, Oral Hypoglycemic Agents, Insulin Dependent    Renal/GU negative Renal ROS  negative genitourinary   Musculoskeletal  (+) Arthritis ,    Abdominal   Peds  Hematology negative hematology ROS (+)   Anesthesia Other Findings Day of surgery medications reviewed with patient.  Reproductive/Obstetrics                              Anesthesia Physical Anesthesia Plan  ASA: 3  Anesthesia Plan: General   Post-op Pain Management: Tylenol PO (pre-op)*   Induction: Intravenous  PONV Risk Score and Plan: 3 and Propofol infusion, TIVA, Treatment may vary due to age or medical condition, Ondansetron, Dexamethasone and Midazolam  Airway Management Planned: LMA  Additional Equipment: None  Intra-op Plan:   Post-operative Plan: Extubation in OR  Informed Consent: I have reviewed the patients History and Physical, chart, labs and discussed the procedure including the risks, benefits and alternatives for the proposed anesthesia with the patient or authorized representative who has indicated his/her understanding and  acceptance.     Dental advisory given  Plan Discussed with: CRNA  Anesthesia Plan Comments: (See recent PAT note by Antionette Poles, PA-C 12/02/22  )         Anesthesia Quick Evaluation

## 2023-01-11 ENCOUNTER — Ambulatory Visit: Payer: Medicare Other | Attending: General Surgery | Admitting: Rehabilitation

## 2023-01-11 ENCOUNTER — Encounter: Payer: Self-pay | Admitting: Rehabilitation

## 2023-01-11 ENCOUNTER — Other Ambulatory Visit: Payer: Self-pay

## 2023-01-11 DIAGNOSIS — C50411 Malignant neoplasm of upper-outer quadrant of right female breast: Secondary | ICD-10-CM | POA: Insufficient documentation

## 2023-01-11 DIAGNOSIS — Z483 Aftercare following surgery for neoplasm: Secondary | ICD-10-CM | POA: Diagnosis present

## 2023-01-11 DIAGNOSIS — Z9189 Other specified personal risk factors, not elsewhere classified: Secondary | ICD-10-CM | POA: Diagnosis present

## 2023-01-11 DIAGNOSIS — Z170 Estrogen receptor positive status [ER+]: Secondary | ICD-10-CM | POA: Insufficient documentation

## 2023-01-11 NOTE — Therapy (Signed)
OUTPATIENT PHYSICAL THERAPY BREAST CANCER BASELINE EVALUATION   Patient Name: [name] MRN: [id number] DOB:June 04, 1955, 67 y.o., female Today's Date: 01/11/2023  END OF SESSION:  PT End of Session - 01/11/23 1236     Visit Number 1    Number of Visits 2    Date for PT Re-Evaluation 02/22/23    Authorization Type U[employer] MCR - needs auth    PT Start Time 1200    PT Stop Time 1235    PT Time Calculation (min) 35 min    Activity Tolerance Patient tolerated treatment well    Behavior During Therapy [facility] for tasks assessed/performed             Past Medical History:  Diagnosis Date   Anemia    Arthritis    Atypical chest pain    a. 05/2018 CTA chest: No PE; b. 05/2018 MV: No ischemia/scar. EF 65%.   Breast cancer [facility])    Coronary artery calcification seen on CT scan    a. 05/2018 CT Chest: Coronary and Ao atherosclerotic Calcifications.   Diabetes mellitus without complication (HCC)    Diastolic dysfunction    a. 05/2018 Echo: EF 60-65%, impaired relaxation. Nl RVSP.   Dysrhythmia    PVC's   Family history of adverse reaction to anesthesia    sister-malignant hyperthermia   FH of Malignant hyperthermia    a. sister had malginant hyperthermia   History of kidney stones    History of renal insufficiency    History of skin cancer    Hyperlipemia    Osteoporosis    Pulmonary nodules    a. 05/2018 CT Chest: Small pulm nodules measuring up to 6mm avg diameter. Rec non-contrast Chest CT in 6-12 mos.   Thyroid nodule    Past Surgical History:  Procedure Laterality Date   BREAST BIOPSY Right 10/27/2022   MM RT BREAST BX W LOC DEV 1ST LESION IMAGE BX SPEC STEREO GUIDE 10/27/2022 [facility]   BREAST BIOPSY  12/06/2022   MM RT RADIOACTIVE SEED LOC MAMMO GUIDE 12/06/2022 [facility]   BREAST LUMPECTOMY WITH RADIOACTIVE SEED LOCALIZATION Right 12/07/2022   Procedure: RIGHT BREAST LUMPECTOMY WITH RADIOACTIVE SEED LOCALIZATION;  Surgeon: Emelia Loron, MD;  Location:  [facility] OR;  Service: General;  Laterality: Right;   CESAREAN SECTION     CHOLECYSTECTOMY  2006   lap choli with umb hernia   COLONOSCOPY     HYSTEROSCOPY WITH D & C N/A 12/07/2022   Procedure: DILATATION AND CURETTAGE /HYSTEROSCOPY WITH POLYPECTOMY ENDOMETRIAL BIOPSY;  Surgeon: Waynard Reeds, MD;  Location: [facility] OR;  Service: Gynecology;  Laterality: N/A;   LIPOMA EXCISION Left 03/15/2014   Procedure: EXCISION LIPOMA LEFT LOWER QUARDRANT ABDOMINAL WALL;  Surgeon: Violeta Gelinas, MD;  Location: [facility];  Service: General;  Laterality: Left;   THYROIDECTOMY N/A 06/25/2019   Procedure: TOTAL THYROIDECTOMY;  Surgeon: Darnell Level, MD;  Location: [facility];  Service: General;  Laterality: N/A;   TONSILLECTOMY     TRIGGER FINGER RELEASE  2012   lt small,middle,long   TRIGGER FINGER RELEASE Right 07/14/2017   Procedure: RELEASE TRIGGER FINGER/A-1 PULLEY RIGHT THUMB;  Surgeon: Cindee Salt, MD;  Location: [facility];  Service: Orthopedics;  Laterality: Right;   UMBILICAL HERNIA REPAIR  2006   with lap choli   Patient Active Problem List   Diagnosis Date Noted   CHEK2 gene mutation positive 11/12/2022   Genetic testing 11/12/2022   Malignant neoplasm of upper-outer quadrant of right  breast in female, estrogen receptor positive (HCC) 11/01/2022   Neoplasm of uncertain behavior of thyroid gland 06/19/2019   Multiple thyroid nodules 06/19/2019   Unstable angina (HCC) 06/15/2018   Chest pain    Plantar fibromatosis 09/09/2015    PCP: Guerry Bruin, MD  REFERRING PROVIDER: Emelia Loron, MD  REFERRING DIAG:  Diagnosis  C50.911 (ICD-10-CM) - Primary invasive malignant neoplasm of right female breast [facility])    THERAPY DIAG:  Malignant neoplasm of upper-outer quadrant of right breast in female, estrogen receptor positive [facility])  Aftercare following surgery for neoplasm  At risk for lymphedema  Rationale for Evaluation and Treatment: Rehabilitation  ONSET DATE:  12/07/22  SUBJECTIVE:                                                                                                                                                                                           SUBJECTIVE STATEMENT: Will be having lymph node removal tomorrow.   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  Dr. Dwain Sarna then did an US of the axilla and it was decided to go back to do a SLNB.  Will have chemo with taxol or abraxane and then radiation. Will have Rt SLNB and port placement 01/12/23.   PATIENT GOALS:   reduce lymphedema risk and learn post op HEP.   PAIN:  Are you having pain? No  PRECAUTIONS: none   RED FLAGS: None   HAND DOMINANCE: right  WEIGHT BEARING RESTRICTIONS: No  FALLS:  Has patient fallen in last 6 months? Yes. Number of falls 1 when I was fishing - I tripped on something   LIVING ENVIRONMENT: Patient lives with: husband   OCCUPATION: retired   LEISURE: nothing really - help with grandkids  PRIOR LEVEL OF FUNCTION: Independent   OBJECTIVE: Note: Objective measures were completed at Evaluation unless otherwise noted.  COGNITION: Overall cognitive status: Within functional limits for tasks assessed    POSTURE:  Forward head and rounded shoulders posture  UPPER EXTREMITY AROM/PROM:  A/PROM RIGHT   eval   Shoulder extension 60  Shoulder flexion 153  Shoulder abduction 162  Shoulder internal rotation   Shoulder external rotation 80    (Blank rows = not tested)   CERVICAL AROM: All within normal limits:   UPPER EXTREMITY STRENGTH: WNL  LYMPHEDEMA ASSESSMENTS (in cm):   LANDMARK RIGHT   eval  10 cm proximal to olecranon process 29  Olecranon process 26  10 cm proximal to ulnar styloid process 21.3  Just proximal to ulnar styloid process 16.2  Across hand at thumb web  space 19.8  At base of 2nd digit 6.5  (Blank rows = not tested)  LANDMARK LEFT   eval  10 cm  proximal to olecranon process 28.1  Olecranon process 25.7  10 cm proximal to ulnar styloid process 20.1  Just proximal to ulnar styloid process 16.2  Across hand at thumb web space 19.3  At base of 2nd digit 6.3  (Blank rows = not tested)  L-DEX LYMPHEDEMA SCREENING:  The patient was assessed using the L-Dex machine today to produce a lymphedema index baseline score. The patient will be reassessed on a regular basis (typically every 3 months) to obtain new L-Dex scores. If the score is > 6.5 points away from his/her baseline score indicating onset of subclinical lymphedema, it will be recommended to wear a compression garment for 4 weeks, 12 hours per day and then be reassessed. If the score continues to be > 6.5 points from baseline at reassessment, we will initiate lymphedema treatment. Assessing in this manner has a 95% rate of preventing clinically significant lymphedema.    QUICK DASH SURVEY: 0%  PATIENT EDUCATION:  Education details: Lymphedema risk reduction and post op shoulder/posture HEP Person educated: Patient Education method: Explanation, Demonstration, Handout Education comprehension: Patient verbalized understanding and returned demonstration  HOME EXERCISE PROGRAM: Patient was instructed today in a home exercise program today for post op shoulder range of motion. These included active assist shoulder flexion in sitting, scapular retraction, wall walking with shoulder abduction, and hands behind head external rotation.  She was encouraged to do these twice a day, holding 3 seconds and repeating 5 times when permitted by her physician.   ASSESSMENT:  CLINICAL IMPRESSION: Pt is doing very well after lumpectomy but will now be having SLNB, chemo, and radiation and will benefit from a post op PT reassessment to determine needs and from L-Dex screens every 3 months for 2 years to detect subclinical lymphedema.  Pt will benefit from skilled therapeutic intervention to  improve on the following deficits: Decreased knowledge of precautions, impaired UE functional use, pain, decreased ROM, postural dysfunction.   PT treatment/interventions: ADL/self-care home management, pt/family education, therapeutic exercise  REHAB POTENTIAL: Excellent  CLINICAL DECISION MAKING: Stable/uncomplicated  EVALUATION COMPLEXITY: Low   GOALS: Goals reviewed with patient? YES  LONG TERM GOALS: (STG=LTG)    Name Target Date Goal status  1 Pt will be able to verbalize understanding of pertinent lymphedema risk reduction practices relevant to her dx specifically related to skin care.  Baseline:  No knowledge 01/11/2023 Achieved at eval  2 Pt will be able to return demo and/or verbalize understanding of the post op HEP related to regaining shoulder ROM. Baseline:  No knowledge 01/11/2023 Achieved at eval  3 Pt will be able to verbalize understanding of the importance of attending the post op After Breast CA Class for further lymphedema risk reduction education and therapeutic exercise.  Baseline:  No knowledge 01/11/2023 Achieved at eval  4 Pt will demo she has regained full shoulder ROM and function post operatively compared to baselines.  Baseline: See objective measurements taken today. 02/22/23 NEW    PLAN:  PT FREQUENCY/DURATION: EVAL and 1 follow up appointment.   PLAN FOR NEXT SESSION: will reassess 3-4 weeks post op to determine needs.   Patient will follow up at outpatient cancer rehab 3-4 weeks following surgery.  If the patient requires physical therapy at that time, a specific plan will be dictated and sent to the referring physician for approval. The patient was educated  today on appropriate basic range of motion exercises to begin post operatively and the importance of attending the After Breast Cancer class following surgery.  Patient was educated today on lymphedema risk reduction practices as it pertains to recommendations that will benefit the patient  immediately following surgery.  She verbalized good understanding.    Physical Therapy Information for After Breast Cancer Surgery/Treatment:  Lymphedema is a swelling condition that you may be at risk for in your arm if you have lymph nodes removed from the armpit area.  After a sentinel node biopsy, the risk is approximately 5-9% and is higher after an axillary node dissection.  There is treatment available for this condition and it is not life-threatening.  Contact your physician or physical therapist with concerns. You may begin the 4 shoulder/posture exercises (see additional sheet) when permitted by your physician (typically a week after surgery).  If you have drains, you may need to wait until those are removed before beginning range of motion exercises.  A general recommendation is to not lift your arms above shoulder height until drains are removed.  These exercises should be done to your tolerance and gently.  This is not a "no pain/no gain" type of recovery so listen to your body and stretch into the range of motion that you can tolerate, stopping if you have pain.  If you are having immediate reconstruction, ask your plastic surgeon about doing exercises as he or she may want you to wait. We encourage you to attend the free one time ABC (After Breast Cancer) class offered by [facility].  You will learn information related to lymphedema risk, prevention and treatment and additional exercises to regain mobility following surgery.  You can call [phone] for more information.  This is offered the 1st and 3rd Monday of each month.  You only attend the class one time. While undergoing any medical procedure or treatment, try to avoid blood pressure being taken or needle sticks from occurring on the arm on the side of cancer.   This recommendation begins after surgery and continues for the rest of your life.  This may help reduce your risk of getting lymphedema (swelling in  your arm). An excellent resource for those seeking information on lymphedema is the National Lymphedema Network's web site. It can be accessed at www.lymphnet.org If you notice swelling in your hand, arm or breast at any time following surgery (even if it is many years from now), please contact your doctor or physical therapist to discuss this.  Lymphedema can be treated at any time but it is easier for you if it is treated early on.  If you feel like your shoulder motion is not returning to normal in a reasonable amount of time, please contact your surgeon or physical therapist.  [facility] [phone]. [address], [place] [address]  ABC CLASS After Breast Cancer Class  After Breast Cancer Class is a specially designed exercise class to assist you in a safe recover after having breast cancer surgery.  In this class you will learn how to get back to full function whether your drains were just removed or if you had surgery a month ago.  This one-time class is held the 1st and 3rd Monday of every month from 11:00 a.m. until 12:00 noon virtually.  This class is FREE and space is limited. For more information or to register for the next available class, call [phone].  Class Goals  Understand specific stretches to improve the flexibility of you chest and shoulder. Learn ways to safely strengthen your upper body and improve your posture. Understand the warning signs of infection and why you may be at risk for an arm infection. Learn about Lymphedema and prevention.  ** You do not attend this class until after surgery.  Drains must be removed to participate  Patient was instructed today in a home exercise program today for post op shoulder range of motion. These included active assist shoulder flexion in sitting, scapular retraction, wall walking with shoulder abduction, and hands behind head external rotation.  She was encouraged to do  these twice a day, holding 3 seconds and repeating 5 times when permitted by her physician.    Idamae Lusher, PT 01/11/2023, 12:38 PM

## 2023-01-12 ENCOUNTER — Ambulatory Visit (HOSPITAL_COMMUNITY)
Admission: RE | Admit: 2023-01-12 | Discharge: 2023-01-12 | Disposition: A | Discharge status: Home / Self Care | Payer: Medicare Other | Attending: General Surgery | Admitting: General Surgery

## 2023-01-12 ENCOUNTER — Other Ambulatory Visit: Payer: Self-pay

## 2023-01-12 ENCOUNTER — Encounter (HOSPITAL_COMMUNITY)
Admission: RE | Disposition: A | Discharge status: Home / Self Care | Payer: Self-pay | Source: Home / Self Care | Attending: General Surgery

## 2023-01-12 ENCOUNTER — Ambulatory Visit (HOSPITAL_COMMUNITY): Payer: Medicare Other

## 2023-01-12 ENCOUNTER — Ambulatory Visit (HOSPITAL_BASED_OUTPATIENT_CLINIC_OR_DEPARTMENT_OTHER): Payer: Medicare Other | Admitting: Anesthesiology

## 2023-01-12 ENCOUNTER — Encounter (HOSPITAL_COMMUNITY): Payer: Self-pay | Admitting: General Surgery

## 2023-01-12 ENCOUNTER — Ambulatory Visit (HOSPITAL_COMMUNITY): Payer: Medicare Other | Admitting: Physician Assistant

## 2023-01-12 DIAGNOSIS — Z170 Estrogen receptor positive status [ER+]: Secondary | ICD-10-CM | POA: Diagnosis not present

## 2023-01-12 DIAGNOSIS — C50411 Malignant neoplasm of upper-outer quadrant of right female breast: Secondary | ICD-10-CM | POA: Insufficient documentation

## 2023-01-12 DIAGNOSIS — E119 Type 2 diabetes mellitus without complications: Secondary | ICD-10-CM | POA: Diagnosis not present

## 2023-01-12 DIAGNOSIS — Z1731 Human epidermal growth factor receptor 2 positive status: Secondary | ICD-10-CM | POA: Insufficient documentation

## 2023-01-12 DIAGNOSIS — I10 Essential (primary) hypertension: Secondary | ICD-10-CM | POA: Insufficient documentation

## 2023-01-12 DIAGNOSIS — D0511 Intraductal carcinoma in situ of right breast: Secondary | ICD-10-CM | POA: Diagnosis not present

## 2023-01-12 DIAGNOSIS — Z452 Encounter for adjustment and management of vascular access device: Secondary | ICD-10-CM | POA: Diagnosis present

## 2023-01-12 DIAGNOSIS — Z7984 Long term (current) use of oral hypoglycemic drugs: Secondary | ICD-10-CM | POA: Insufficient documentation

## 2023-01-12 DIAGNOSIS — Z794 Long term (current) use of insulin: Secondary | ICD-10-CM | POA: Insufficient documentation

## 2023-01-12 DIAGNOSIS — Z1722 Progesterone receptor negative status: Secondary | ICD-10-CM | POA: Diagnosis not present

## 2023-01-12 DIAGNOSIS — I2510 Atherosclerotic heart disease of native coronary artery without angina pectoris: Secondary | ICD-10-CM

## 2023-01-12 HISTORY — PX: AXILLARY LYMPH NODE BIOPSY: SHX5737

## 2023-01-12 HISTORY — PX: PORTACATH PLACEMENT: SHX2246

## 2023-01-12 LAB — GLUCOSE, CAPILLARY
Glucose-Capillary: 195 mg/dL — ABNORMAL HIGH (ref 70–99)
Glucose-Capillary: 130 mg/dL — ABNORMAL HIGH (ref 70–99)
Glucose-Capillary: 149 mg/dL — ABNORMAL HIGH (ref 70–99)

## 2023-01-12 SURGERY — AXILLARY LYMPH NODE BIOPSY
Anesthesia: General | Site: Axilla | Laterality: Right

## 2023-01-12 MED ORDER — OXYCODONE HCL 5 MG/5ML PO SOLN: 5.0000 mg | Freq: Once | ORAL | Status: DC | PRN

## 2023-01-12 MED ORDER — INSULIN ASPART 100 UNIT/ML IJ SOLN: 0.0000 [IU] | INTRAMUSCULAR | Status: DC | PRN

## 2023-01-12 MED ORDER — TRAMADOL HCL 50 MG PO TABS
50.0000 mg | ORAL_TABLET | Freq: Four times a day (QID) | ORAL | 0 refills | Status: DC | PRN
Start: 2023-01-12 — End: 2023-01-31

## 2023-01-12 MED ORDER — ACETAMINOPHEN 325 MG PO TABS: 650.0000 mg | ORAL_TABLET | ORAL | Status: DC | PRN

## 2023-01-12 MED ORDER — PROPOFOL 10 MG/ML IV BOLUS
INTRAVENOUS | Status: AC
  Filled 2023-01-12: qty 20

## 2023-01-12 MED ORDER — MIDAZOLAM HCL 2 MG/2ML IJ SOLN
INTRAMUSCULAR | Status: AC
  Filled 2023-01-12: qty 2

## 2023-01-12 MED ORDER — ORAL CARE MOUTH RINSE: 15.0000 mL | Freq: Once | OROMUCOSAL | Status: AC

## 2023-01-12 MED ORDER — LIDOCAINE 2% (20 MG/ML) 5 ML SYRINGE
INTRAMUSCULAR | Status: AC
  Filled 2023-01-12: qty 5

## 2023-01-12 MED ORDER — BUPIVACAINE-EPINEPHRINE (PF) 0.25% -1:200000 IJ SOLN
INTRAMUSCULAR | Status: AC
  Filled 2023-01-12: qty 30

## 2023-01-12 MED ORDER — HEPARIN SOD (PORK) LOCK FLUSH 100 UNIT/ML IV SOLN
INTRAVENOUS | Status: DC | PRN
  Administered 2023-01-12: 400 [IU] via INTRAVENOUS

## 2023-01-12 MED ORDER — CHLORHEXIDINE GLUCONATE CLOTH 2 % EX PADS: 6.0000 | MEDICATED_PAD | Freq: Once | CUTANEOUS | Status: DC

## 2023-01-12 MED ORDER — ACETAMINOPHEN 650 MG RE SUPP: 650.0000 mg | RECTAL | Status: DC | PRN

## 2023-01-12 MED ORDER — HEPARIN SOD (PORK) LOCK FLUSH 100 UNIT/ML IV SOLN
INTRAVENOUS | Status: AC
  Filled 2023-01-12: qty 5

## 2023-01-12 MED ORDER — CEFAZOLIN SODIUM-DEXTROSE 2-4 GM/100ML-% IV SOLN
2.0000 g | INTRAVENOUS | Status: AC
  Administered 2023-01-12: 2 g via INTRAVENOUS
  Filled 2023-01-12: qty 100

## 2023-01-12 MED ORDER — OXYCODONE HCL 5 MG PO TABS: 5.0000 mg | ORAL_TABLET | ORAL | Status: DC | PRN

## 2023-01-12 MED ORDER — ENSURE PRE-SURGERY PO LIQD: 296.0000 mL | Freq: Once | ORAL | Status: DC

## 2023-01-12 MED ORDER — ONDANSETRON HCL 4 MG/2ML IJ SOLN
INTRAMUSCULAR | Status: AC
  Filled 2023-01-12: qty 2

## 2023-01-12 MED ORDER — HEMOSTATIC AGENTS (NO CHARGE) OPTIME
TOPICAL | Status: DC | PRN
Start: 2023-01-12 — End: 2023-01-12
  Administered 2023-01-12: 1 via TOPICAL

## 2023-01-12 MED ORDER — CHLORHEXIDINE GLUCONATE 0.12 % MT SOLN
15.0000 mL | Freq: Once | OROMUCOSAL | Status: AC
  Administered 2023-01-12: 15 mL via OROMUCOSAL
  Filled 2023-01-12: qty 15

## 2023-01-12 MED ORDER — HEPARIN 6000 UNIT IRRIGATION SOLUTION
Status: AC
  Filled 2023-01-12: qty 500

## 2023-01-12 MED ORDER — LACTATED RINGERS IV SOLN: INTRAVENOUS | Status: DC

## 2023-01-12 MED ORDER — PHENYLEPHRINE HCL-NACL 20-0.9 MG/250ML-% IV SOLN
INTRAVENOUS | Status: DC | PRN
  Administered 2023-01-12: 40 ug/min via INTRAVENOUS

## 2023-01-12 MED ORDER — FENTANYL CITRATE (PF) 250 MCG/5ML IJ SOLN
INTRAMUSCULAR | Status: AC
  Filled 2023-01-12: qty 5

## 2023-01-12 MED ORDER — HEPARIN 6000 UNIT IRRIGATION SOLUTION
Status: DC | PRN
  Administered 2023-01-12: 1

## 2023-01-12 MED ORDER — FENTANYL CITRATE (PF) 250 MCG/5ML IJ SOLN
INTRAMUSCULAR | Status: DC | PRN
  Administered 2023-01-12: 25 ug via INTRAVENOUS
  Administered 2023-01-12: 50 ug via INTRAVENOUS
  Administered 2023-01-12: 25 ug via INTRAVENOUS

## 2023-01-12 MED ORDER — DROPERIDOL 2.5 MG/ML IJ SOLN: 0.6250 mg | Freq: Once | INTRAMUSCULAR | Status: DC | PRN

## 2023-01-12 MED ORDER — 0.9 % SODIUM CHLORIDE (POUR BTL) OPTIME
TOPICAL | Status: DC | PRN
  Administered 2023-01-12: 1000 mL

## 2023-01-12 MED ORDER — MIDAZOLAM HCL 2 MG/2ML IJ SOLN
INTRAMUSCULAR | Status: DC | PRN
  Administered 2023-01-12: 2 mg via INTRAVENOUS

## 2023-01-12 MED ORDER — FENTANYL CITRATE (PF) 100 MCG/2ML IJ SOLN: 25.0000 ug | INTRAMUSCULAR | Status: DC | PRN

## 2023-01-12 MED ORDER — ONDANSETRON HCL 4 MG/2ML IJ SOLN
INTRAMUSCULAR | Status: DC | PRN
  Administered 2023-01-12: 4 mg via INTRAVENOUS

## 2023-01-12 MED ORDER — ACETAMINOPHEN 500 MG PO TABS
1000.0000 mg | ORAL_TABLET | ORAL | Status: AC
  Administered 2023-01-12: 1000 mg via ORAL
  Filled 2023-01-12: qty 2

## 2023-01-12 MED ORDER — OXYCODONE HCL 5 MG PO TABS: 5.0000 mg | ORAL_TABLET | Freq: Once | ORAL | Status: DC | PRN

## 2023-01-12 MED ORDER — DEXAMETHASONE SODIUM PHOSPHATE 10 MG/ML IJ SOLN
INTRAMUSCULAR | Status: AC
  Filled 2023-01-12: qty 1

## 2023-01-12 MED ORDER — BUPIVACAINE-EPINEPHRINE 0.25% -1:200000 IJ SOLN
INTRAMUSCULAR | Status: DC | PRN
  Administered 2023-01-12: 6 mL

## 2023-01-12 MED ORDER — PROPOFOL 10 MG/ML IV BOLUS
INTRAVENOUS | Status: DC | PRN
  Administered 2023-01-12: 180 mg via INTRAVENOUS

## 2023-01-12 MED ORDER — PROPOFOL 500 MG/50ML IV EMUL
INTRAVENOUS | Status: DC | PRN
Start: 2023-01-12 — End: 2023-01-12
  Administered 2023-01-12: 50 ug/kg/min via INTRAVENOUS

## 2023-01-12 MED ORDER — DEXAMETHASONE SODIUM PHOSPHATE 10 MG/ML IJ SOLN
INTRAMUSCULAR | Status: DC | PRN
  Administered 2023-01-12: 5 mg via INTRAVENOUS

## 2023-01-12 MED ORDER — LIDOCAINE 2% (20 MG/ML) 5 ML SYRINGE
INTRAMUSCULAR | Status: DC | PRN
  Administered 2023-01-12: 60 mg via INTRAVENOUS

## 2023-01-12 MED ORDER — MAGTRACE LYMPHATIC TRACER
INTRAMUSCULAR | Status: DC | PRN
  Administered 2023-01-12: 2 mL via INTRAMUSCULAR

## 2023-01-12 SURGICAL SUPPLY — 62 items
ADH SKN CLS APL DERMABOND .7 (GAUZE/BANDAGES/DRESSINGS) ×4
APL PRP STRL LF DISP 70% ISPRP (MISCELLANEOUS) ×2
APL SKNCLS STERI-STRIP NONHPOA (GAUZE/BANDAGES/DRESSINGS)
APPLIER CLIP 9.375 MED OPEN (MISCELLANEOUS) ×2
APR CLP MED 9.3 20 MLT OPN (MISCELLANEOUS) ×2
BAG COUNTER SPONGE SURGICOUNT (BAG) ×3 IMPLANT
BAG DECANTER FOR FLEXI CONT (MISCELLANEOUS) ×3 IMPLANT
BAG SPNG CNTER NS LX DISP (BAG) ×2
BENZOIN TINCTURE PRP APPL 2/3 (GAUZE/BANDAGES/DRESSINGS) IMPLANT
BINDER BREAST BLACK XL (GAUZE/BANDAGES/DRESSINGS) IMPLANT
BINDER BREAST LRG (GAUZE/BANDAGES/DRESSINGS) IMPLANT
BINDER BREAST XLRG (GAUZE/BANDAGES/DRESSINGS) IMPLANT
CANISTER SUCT 3000ML PPV (MISCELLANEOUS) ×3 IMPLANT
CHLORAPREP W/TINT 26 (MISCELLANEOUS) ×3 IMPLANT
CLIP APPLIE 9.375 MED OPEN (MISCELLANEOUS) IMPLANT
CNTNR URN SCR LID CUP LEK RST (MISCELLANEOUS) ×3 IMPLANT
CONT SPEC 4OZ STRL OR WHT (MISCELLANEOUS) ×2
COVER PROBE W GEL 5X96 (DRAPES) ×3 IMPLANT
COVER SURGICAL LIGHT HANDLE (MISCELLANEOUS) ×3 IMPLANT
DERMABOND ADVANCED .7 DNX12 (GAUZE/BANDAGES/DRESSINGS) ×3 IMPLANT
DRAPE C-ARM 42X120 X-RAY (DRAPES) ×3 IMPLANT
DRAPE CHEST BREAST 15X10 FENES (DRAPES) ×3 IMPLANT
DRAPE UTILITY XL STRL (DRAPES) IMPLANT
ELECT CAUTERY BLADE 6.4 (BLADE) ×3 IMPLANT
ELECT REM PT RETURN 9FT ADLT (ELECTROSURGICAL) ×2
ELECTRODE REM PT RTRN 9FT ADLT (ELECTROSURGICAL) ×3 IMPLANT
GAUZE 4X4 16PLY ~~LOC~~+RFID DBL (SPONGE) ×3 IMPLANT
GAUZE PAD ABD 8X10 STRL (GAUZE/BANDAGES/DRESSINGS) IMPLANT
GAUZE SPONGE 4X4 12PLY STRL (GAUZE/BANDAGES/DRESSINGS) IMPLANT
GEL ULTRASOUND 20GR AQUASONIC (MISCELLANEOUS) ×3 IMPLANT
GLOVE BIO SURGEON STRL SZ7 (GLOVE) ×3 IMPLANT
GLOVE BIOGEL PI IND STRL 7.5 (GLOVE) ×3 IMPLANT
GOWN STRL REUS W/ TWL LRG LVL3 (GOWN DISPOSABLE) ×6 IMPLANT
GOWN STRL REUS W/TWL LRG LVL3 (GOWN DISPOSABLE) ×4
HEMOSTAT ARISTA ABSORB 3G PWDR (HEMOSTASIS) IMPLANT
KIT BASIN OR (CUSTOM PROCEDURE TRAY) ×3 IMPLANT
KIT PORT INFUSION SMART 8FR (Port) IMPLANT
KIT TURNOVER KIT B (KITS) ×3 IMPLANT
NDL 18GX1X1/2 (RX/OR ONLY) (NEEDLE) IMPLANT
NDL FILTER BLUNT 18X1 1/2 (NEEDLE) IMPLANT
NDL HYPO 25GX1X1/2 BEV (NEEDLE) ×3 IMPLANT
NEEDLE 18GX1X1/2 (RX/OR ONLY) (NEEDLE)
NEEDLE FILTER BLUNT 18X1 1/2 (NEEDLE)
NEEDLE HYPO 25GX1X1/2 BEV (NEEDLE) ×2
NS IRRIG 1000ML POUR BTL (IV SOLUTION) ×3 IMPLANT
PAD ARMBOARD 7.5X6 YLW CONV (MISCELLANEOUS) ×6 IMPLANT
PENCIL BUTTON HOLSTER BLD 10FT (ELECTRODE) ×3 IMPLANT
POSITIONER HEAD DONUT 9IN (MISCELLANEOUS) ×3 IMPLANT
SPIKE FLUID TRANSFER (MISCELLANEOUS) ×3 IMPLANT
STRIP CLOSURE SKIN 1/2X4 (GAUZE/BANDAGES/DRESSINGS) ×3 IMPLANT
SUT MNCRL AB 4-0 PS2 18 (SUTURE) ×3 IMPLANT
SUT PROLENE 2 0 SH DA (SUTURE) ×3 IMPLANT
SUT VIC AB 2-0 SH 27 (SUTURE) ×2
SUT VIC AB 2-0 SH 27XBRD (SUTURE) ×3 IMPLANT
SUT VIC AB 3-0 SH 27 (SUTURE) ×4
SUT VIC AB 3-0 SH 27X BRD (SUTURE) IMPLANT
SYR 5ML LUER SLIP (SYRINGE) ×3 IMPLANT
SYR CONTROL 10ML LL (SYRINGE) ×3 IMPLANT
TOWEL GREEN STERILE (TOWEL DISPOSABLE) ×3 IMPLANT
TOWEL GREEN STERILE FF (TOWEL DISPOSABLE) ×3 IMPLANT
TRACER MAGTRACE VIAL (MISCELLANEOUS) IMPLANT
TRAY LAPAROSCOPIC MC (CUSTOM PROCEDURE TRAY) ×3 IMPLANT

## 2023-01-12 NOTE — Transfer of Care (Signed)
Immediate Anesthesia Transfer of Care Note  Patient: [name]  Procedure(s) Performed: RIGHT AXILLARY SENTINEL NODE BIOPSY (Right: Axilla) PORT PLACEMENT WITH ULTRASOUND GUIDANCE  Patient Location: PACU  Anesthesia Type:General  Level of Consciousness: drowsy and patient cooperative  Airway & Oxygen Therapy: Patient Spontanous Breathing and Patient connected to face mask oxygen  Post-op Assessment: Report given to RN and Post -op Vital signs reviewed and stable  Post vital signs: Reviewed and stable  Last Vitals:  Vitals Value Taken Time  BP 126/79 01/12/23 0954  Temp    Pulse 79 01/12/23 0958  Resp 9 01/12/23 0958  SpO2 94 % 01/12/23 0958  Vitals shown include unfiled device data.  Last Pain:  Vitals:   01/12/23 0656  TempSrc:   PainSc: 0-No pain         Complications: No notable events documented.

## 2023-01-12 NOTE — H&P (Signed)
[name] is an 67 y.o. female.   Chief Complaint: breast cancer HPI: 67 year old female who underwent lumpectomy for a core biopsy that showed DCIS. She has done fine after surgery. 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 Her margins are all clear especially now that she has invasive tumor with it. This is negative for LVI. Her prognostic panel is 95% ER positive, PR negative, HER2 is positive and her KI is 5%. She is here to discuss her options.   Past Medical History:  Diagnosis Date   Anemia    Arthritis    Atypical chest pain    a. 05/2018 CTA chest: No PE; b. 05/2018 MV: No ischemia/scar. EF 65%.   Breast cancer [facility])    Coronary artery calcification seen on CT scan    a. 05/2018 CT Chest: Coronary and Ao atherosclerotic Calcifications.   Diabetes mellitus without complication (HCC)    Diastolic dysfunction    a. 05/2018 Echo: EF 60-65%, impaired relaxation. Nl RVSP.   Dysrhythmia    PVC's   Family history of adverse reaction to anesthesia    sister-malignant hyperthermia   FH of Malignant hyperthermia    a. sister had malginant hyperthermia   History of kidney stones    History of renal insufficiency    History of skin cancer    Hyperlipemia    Osteoporosis    Pulmonary nodules    a. 05/2018 CT Chest: Small pulm nodules measuring up to 6mm avg diameter. Rec non-contrast Chest CT in 6-12 mos.   Thyroid nodule     Past Surgical History:  Procedure Laterality Date   BREAST BIOPSY Right 10/27/2022   MM RT BREAST BX W LOC DEV 1ST LESION IMAGE BX SPEC STEREO GUIDE 10/27/2022 [facility]   BREAST BIOPSY  12/06/2022   MM RT RADIOACTIVE SEED LOC MAMMO GUIDE 12/06/2022 [facility]   BREAST LUMPECTOMY WITH RADIOACTIVE SEED LOCALIZATION Right 12/07/2022   Procedure: RIGHT BREAST LUMPECTOMY WITH RADIOACTIVE SEED LOCALIZATION;  Surgeon: Emelia Loron, MD;  Location: [facility] OR;   Service: General;  Laterality: Right;   CESAREAN SECTION     CHOLECYSTECTOMY  2006   lap choli with umb hernia   COLONOSCOPY     HYSTEROSCOPY WITH D & C N/A 12/07/2022   Procedure: DILATATION AND CURETTAGE /HYSTEROSCOPY WITH POLYPECTOMY ENDOMETRIAL BIOPSY;  Surgeon: Waynard Reeds, MD;  Location: [facility] OR;  Service: Gynecology;  Laterality: N/A;   LIPOMA EXCISION Left 03/15/2014   Procedure: EXCISION LIPOMA LEFT LOWER QUARDRANT ABDOMINAL WALL;  Surgeon: Violeta Gelinas, MD;  Location: [facility];  Service: General;  Laterality: Left;   THYROIDECTOMY N/A 06/25/2019   Procedure: TOTAL THYROIDECTOMY;  Surgeon: Darnell Level, MD;  Location: [facility];  Service: General;  Laterality: N/A;   TONSILLECTOMY     TRIGGER FINGER RELEASE  2012   lt small,middle,long   TRIGGER FINGER RELEASE Right 07/14/2017   Procedure: RELEASE TRIGGER FINGER/A-1 PULLEY RIGHT THUMB;  Surgeon: Cindee Salt, MD;  Location: [facility];  Service: Orthopedics;  Laterality: Right;   UMBILICAL HERNIA REPAIR  2006   with lap choli    Family History  Problem Relation Age of Onset   Heart disease Mother    Throat cancer Mother    Diabetes Father    Breast cancer Sister 79       d. 74; mets   Breast cancer Sister  61       d. 60s; mets   Heart disease Other    Social History:  reports that she has never smoked. She has never used smokeless tobacco. She reports that she does not drink alcohol and does not use drugs.  Allergies: No Known Allergies  Medications Prior to Admission  Medication Sig Dispense Refill   aspirin EC 81 MG tablet Take 81 mg by mouth daily.     Insulin Glargine (BASAGLAR KWIKPEN) 100 UNIT/ML SOPN Inject 35 Units into the skin at bedtime.     insulin lispro (HUMALOG) 100 UNIT/ML injection Inject 10-15 Units into the skin 3 (three) times daily before meals. Sliding scale     levothyroxine (SYNTHROID) 112 MCG tablet Take 112 mcg by mouth daily before breakfast.     metFORMIN  (GLUCOPHAGE) 1000 MG tablet Take 1,000 mg by mouth 2 (two) times daily with a meal.     metoprolol succinate (TOPROL-XL) 25 MG 24 hr tablet TAKE 1 TABLET (25 MG TOTAL) BY MOUTH DAILY. (Patient taking differently: Take 25 mg by mouth daily with supper.) 90 tablet 0   nitrofurantoin (MACRODANTIN) 100 MG capsule Take 100 mg by mouth daily.     Nutritional Supplements (JUICE PLUS FIBRE PO) Take 3 tablets by mouth daily. Juice Plus Vegetable     Nutritional Supplements (JUICE PLUS FIBRE PO) Take 3 tablets by mouth daily. Juice Plus Fruit     ramipril (ALTACE) 5 MG capsule Take 5 mg by mouth daily with breakfast.     rosuvastatin (CRESTOR) 20 MG tablet Take 20 mg by mouth daily with supper.      triamcinolone cream (KENALOG) 0.1 % Apply 1 Application topically daily as needed (dermatitis).     vitamin C (ASCORBIC ACID) 250 MG tablet Take 500 mg by mouth daily.     lidocaine-prilocaine (EMLA) cream Apply to affected area once 30 g 3   ondansetron (ZOFRAN) 8 MG tablet Take 1 tablet (8 mg total) by mouth every 8 (eight) hours as needed for nausea or vomiting. 30 tablet 1   prochlorperazine (COMPAZINE) 10 MG tablet Take 1 tablet (10 mg total) by mouth every 6 (six) hours as needed for nausea or vomiting. 30 tablet 1    Results for orders placed or performed during the hospital encounter of 01/12/23 (from the past 48 hour(s))  Glucose, capillary     Status: Abnormal   Collection Time: 01/12/23  6:44 AM  Result Value Ref Range   Glucose-Capillary 195 (H) 70 - 99 mg/dL    Comment: Glucose reference range applies only to samples taken after fasting for at least 8 hours.   No results found.  Review of Systems  All other systems reviewed and are negative.   Blood pressure (!) 144/94, pulse 77, temperature 97.7 F (36.5 C), temperature source Oral, resp. rate 18, height 5\' 6"  (1.676 m), weight 80.7 kg, SpO2 95%. Physical Exam Vitals reviewed.  Constitutional:      Appearance: Normal appearance.   Cardiovascular:     Rate and Rhythm: Normal rate.  Pulmonary:     Effort: Pulmonary effort is normal.  Neurological:     Mental Status: She is alert.    Right upper outer quadrant breast incision healing well without infection  Assessment/Plan Attempt right ax sn biopsy, port placement  We discussed the HER2 positivity. She has been recommended adjuvant therapy and oncology requests a port. We discussed the role of a sentinel node biopsy. I am going to start with  an axillary ultrasound but I do believe she needs an nodal evaluation and the timing of this will be based on the ultrasound. [place] is negative. We did discuss a sentinel lymph node biopsy today as well as the risks associated with that. Will attempt sn biopsy, if unable to map will not do this and treat as node negative given [place]. She is agreeable to that plan  Emelia Loron, MD 01/12/2023, 8:20 AM

## 2023-01-12 NOTE — Discharge Instructions (Signed)
[facility] Office Phone Number [phone]  POST OP INSTRUCTIONS Take 400 mg of ibuprofen every 8 hours or 650 mg tylenol every 6 hours for next 72 hours then as needed. Use ice several times daily also.  A prescription for pain medication may be given to you upon discharge.  Take your pain medication as prescribed, if needed.  If narcotic pain medicine is not needed, then you may take acetaminophen (Tylenol), naprosyn (Alleve) or ibuprofen (Advil) as needed. Take your usually prescribed medications unless otherwise directed If you need a refill on your pain medication, please contact your pharmacy.  They will contact our office to request authorization.  Prescriptions will not be filled after 5pm or on week-ends. You should eat very light the first 24 hours after surgery, such as soup, crackers, pudding, etc.  Resume your normal diet the day after surgery. Most patients will experience some swelling and bruising in the breast.  Ice packs and a good support bra will help.  Wear the breast binder provided or a sports bra for 72 hours day and night.  After that wear a sports bra during the day until you return to the office. Swelling and bruising can take several days to resolve.  It is common to experience some constipation if taking pain medication after surgery.  Increasing fluid intake and taking a stool softener will usually help or prevent this problem from occurring.  A mild laxative (Milk of Magnesia or Miralax) should be taken according to package directions if there are no bowel movements after 48 hours. I used skin glue on the incision, you may shower in 24 hours.  The glue will flake off over the next 2-3 weeks.  Any sutures or staples will be removed at the office during your follow-up visit. ACTIVITIES:  You may resume regular daily activities (gradually increasing) beginning the next day.  Wearing a good support bra or sports bra minimizes pain and swelling.  You may have  sexual intercourse when it is comfortable. You may drive when you no longer are taking prescription pain medication, you can comfortably wear a seatbelt, and you can safely maneuver your car and apply brakes. RETURN TO WORK:  ______________________________________________________________________________________ [name] should see your doctor in the office for a follow-up appointment approximately two weeks after your surgery.  Your doctor's nurse will typically make your follow-up appointment when she calls you with your pathology report.  Expect your pathology report 3-4 business days after your surgery.  You may call to check if you do not hear from [place] after three days. OTHER INSTRUCTIONS: _______________________________________________________________________________________________ _____________________________________________________________________________________________________________________________________ _____________________________________________________________________________________________________________________________________ _____________________________________________________________________________________________________________________________________  WHEN TO CALL DR Belia Febo: Fever over 101.0 Nausea and/or vomiting. Extreme swelling or bruising. Continued bleeding from incision. Increased pain, redness, or drainage from the incision.  The clinic staff is available to answer your questions during regular business hours.  Please don't hesitate to call and ask to speak to one of the nurses for clinical concerns.  If you have a medical emergency, go to the nearest emergency room or call 911.  A surgeon from [facility] is always on call at the hospital.  For further questions, please visit [email or web address]

## 2023-01-12 NOTE — Interval H&P Note (Signed)
History and Physical Interval Note:  01/12/2023 8:22 AM  [name]  has presented today for surgery, with the diagnosis of DUCTAL CARCINOMA IN SITU RIGHT BREAST.  The various methods of treatment have been discussed with the patient and family. After consideration of risks, benefits and other options for treatment, the patient has consented to  Procedure(s) with comments: RIGHT AXILLARY SENTINEL NODE BIOPSY (Right) - LMA PORT PLACEMENT WITH ULTRASOUND GUIDANCE (N/A) as a surgical intervention.  The patient's history has been reviewed, patient examined, no change in status, stable for surgery.  I have reviewed the patient's chart and labs.  Questions were answered to the patient's satisfaction.     Emelia Loron

## 2023-01-12 NOTE — Anesthesia Procedure Notes (Signed)
Procedure Name: LMA Insertion Date/Time: 01/12/2023 8:39 AM  Performed by: Orlin Hilding, CRNAPre-anesthesia Checklist: Patient identified, Emergency Drugs available, Suction available, Timeout performed and Patient being monitored Patient Re-evaluated:Patient Re-evaluated prior to induction Oxygen Delivery Method: Circle system utilized Preoxygenation: Pre-oxygenation with 100% oxygen Induction Type: IV induction LMA: LMA inserted LMA Size: 4.0 Number of attempts: 1 Placement Confirmation: positive ETCO2 and breath sounds checked- equal and bilateral Tube secured with: Tape

## 2023-01-12 NOTE — Anesthesia Postprocedure Evaluation (Signed)
Anesthesia Post Note  Patient: [name]  Procedure(s) Performed: RIGHT AXILLARY SENTINEL NODE BIOPSY (Right: Axilla) PORT PLACEMENT WITH ULTRASOUND GUIDANCE     Patient location during evaluation: PACU Anesthesia Type: General Level of consciousness: awake and alert Pain management: pain level controlled Vital Signs Assessment: post-procedure vital signs reviewed and stable Respiratory status: spontaneous breathing, nonlabored ventilation and respiratory function stable Cardiovascular status: blood pressure returned to baseline Postop Assessment: no apparent nausea or vomiting Anesthetic complications: no   No notable events documented.  Last Vitals:  Vitals:   01/12/23 1015 01/12/23 1030  BP: 133/82 131/71  Pulse: 75 69  Resp: 11 13  Temp:  36.4 C  SpO2: 95% 97%    Last Pain:  Vitals:   01/12/23 1015  TempSrc:   PainSc: 0-No pain                 Shanda Howells

## 2023-01-12 NOTE — Op Note (Signed)
Preoperative diagnosis: clinical stage I right breast cancer, her 2 positive Postoperative diagnosis: saa Procedure: Left internal jugular port placement with US guidance Right axillary sentinel node biopsy Injection of Magtrace for sentinel node identificatoin Surgeon: Dr Harden Mo Anesthesia: general Specimens right axillary nodes with highest count of 227 Complications none Drains none Sponge and needle count correct Dispo recovery stable  Indications:  67 year old female who underwent lumpectomy for a core biopsy that showed DCIS. She has done fine after surgery. 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 Her margins are all clear especially now that she has invasive tumor with it. This is negative for LVI. Her prognostic panel is 95% ER positive, PR negative, HER2 is positive and her KI is 5%. We discussed port placement and attempted sn biopsy  Procedure: After informed consent obtained patient was taken to the OR. She was given antibiotics.  SCDs were in place.  She was placed under general anesthesia without complication. She was prepped and draped in standard sterile surgical fashoin.  Surgical timeout was performed.  I first injected 1 cc of magtrace in the subareolar position and 1 cc of magtrace around the prior excision site. These were massaged.  I then did the port. I identified the left internal jugular with the ultrasound. I then made a small nick in the skin. I accessed the vein on the first pass. I placed the wire. I confirmed this was in the vein by ultrasound. I then did fluoro and the wire was curling in the contralateral subclavian and not passing down the SVC.  I was unable to do this so I removed the needle and wire. I then reaccessed the vein again and was able with some twisting to pass the wire down into the heart.  I then created a pocket below the clavicle. I tunneled  the line between the two sites.  I then placed the dilator and under fluoroscopy and watched this go down to the SVC.  I placed then line and peeled away the sheath. There was quite a bit of ectopy and I pulled the line back to be near cavoatrial junction where there was no more ectopy.  I then attached the port.  This was placed in the pocket and suture into position once with 2-0 prolene suture.  The final xray showed this in position for use. It flushed easily and aspirated blood. I placed heparin in the port.  I then closed this with 3-0 vicryl and 4-0 monocryl.  I then was able to identify sentinel nodes in the right axilla.  I elected to use a small separate incision as her old incision was too far away from these.  I dissected to the axillary fascia.  Using the Sentimag probe was identifiable to identify what appeared to be 2-3 hot lymph nodes.  They were otherwise normal.  1 of these appeared brown as well.  I removed these.  There was no real background activity present.  I obtained hemostasis.  I then closed this with 2-0 Vicryl, 3-0 Vicryl, 4 Monocryl.  Glue and Steri-Strips were applied.  She tolerated this well was extubated and transferred to recovery stable.

## 2023-01-13 ENCOUNTER — Encounter (HOSPITAL_COMMUNITY): Payer: Self-pay | Admitting: General Surgery

## 2023-01-13 LAB — SURGICAL PATHOLOGY

## 2023-01-18 ENCOUNTER — Encounter: Payer: Self-pay | Admitting: *Deleted

## 2023-01-19 NOTE — Assessment & Plan Note (Signed)
[id number], stage IA, G2, HER2 positive, ER positive, PR negative, and DCIS(+) It was discovered on screening mammogram, initial biopsy showed DCIS only.   -Status post a right lumpectomy.  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 Margins are negative for invasive cancer, but DCIS margin was close. No lymphovascular or perineural invasion. Lymph node biopsy was not performed, Dr. Dwain Sarna has ordered an ultrasound of the right axilla for further evaluation.  If ultrasound is negative, I think it is okay not to have sentinel lymph node biopsy given the small primary tumor. -Given the aggressive nature of her HER2 positive disease, and moderate risk of recurrence, I do recommend adjuvant chemotherapy with Taxol or Abraxane once weekly for 12 weeks. -she underwent port placement and right axillary SLN biopsy on 01/12/23 and all 3 nodes were negative

## 2023-01-20 ENCOUNTER — Other Ambulatory Visit: Payer: Self-pay

## 2023-01-20 ENCOUNTER — Inpatient Hospital Stay: Payer: Medicare Other | Attending: Hematology | Admitting: Hematology

## 2023-01-20 VITALS — BP 132/88 | HR 88 | Temp 98.2°F | Resp 18 | Ht 66.0 in | Wt 176.3 lb

## 2023-01-20 DIAGNOSIS — Z170 Estrogen receptor positive status [ER+]: Secondary | ICD-10-CM

## 2023-01-20 DIAGNOSIS — C50411 Malignant neoplasm of upper-outer quadrant of right female breast: Secondary | ICD-10-CM

## 2023-01-20 DIAGNOSIS — Z79899 Other long term (current) drug therapy: Secondary | ICD-10-CM | POA: Diagnosis not present

## 2023-01-20 NOTE — Progress Notes (Signed)
[facility]   [phone] [phone]   Clinic Follow up Note   Patient Care Team: Tisovec, Adelfa Koh, MD as PCP - General (Internal Medicine) Iran Ouch, MD as PCP - Cardiology (Cardiology) Malachy Mood, MD as Consulting Physician (Hematology) Emelia Loron, MD as Consulting Physician (General Surgery) Dorothy Puffer, MD as Consulting Physician (Radiation Oncology) Pershing Proud, RN as Oncology Nurse Navigator Donnelly Angelica, RN as Oncology Nurse Navigator  Date of Service:  01/20/2023  CHIEF COMPLAINT: f/u of breast cancer   CURRENT THERAPY:  Pending adjuvant weekly Abraxane and trastuzumab  Oncology History   Malignant neoplasm of upper-outer quadrant of right breast in female, estrogen receptor positive (HCC) pT1aN0M0, stage IA, G2, HER2 positive, ER positive, PR negative, and DCIS(+) It was discovered on screening mammogram, initial biopsy showed DCIS only.   -Status post a right lumpectomy.  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 Margins are negative for invasive cancer, but DCIS margin was close. No lymphovascular or perineural invasion. Lymph node biopsy was not performed, Dr. Dwain Sarna has ordered an ultrasound of the right axilla for further evaluation.  If ultrasound is negative, I think it is okay not to have sentinel lymph node biopsy given the small primary tumor. -Given the aggressive nature of her HER2 positive disease, and moderate risk of recurrence, I do recommend adjuvant chemotherapy with Taxol or Abraxane once weekly for 12 weeks. -she underwent port placement and right axillary SLN biopsy on 01/12/23 and all 3 nodes were negative    Assessment and Plan    Breast Cancer Post-operative status after second surgery. Incisions healing well with no signs of infection or complications. Lymph nodes negative. Patient is  scheduled to start chemotherapy upon return from vacation. -Start chemotherapy with Abraxane and trastuzumab on February 03, 2023. -Continue with planned chemotherapy schedule, adjusting for holidays as needed. -Ensure patient has necessary medications for nausea (Zofran and Compazine) and numbing cream for treatment. -Schedule follow-up appointments for monitoring of treatment response and side effects.  Post-operative care Patient reports tenderness at incision sites but no significant pain or complications. -Clean incision sites and provide guidance on home care. -Continue monitoring incision healing at follow-up appointments.  General Health Maintenance / Followup Plans -Attend chemotherapy class on January 31, 2023. -Follow-up appointment with Dr. Dwain Sarna on February 01, 2023. -Start physical rehabilitation on February 02, 2023. -Start chemotherapy on February 03, 2023. -Continue regular follow-up appointments to monitor treatment response and side effects.         SUMMARY OF ONCOLOGIC HISTORY: Oncology History  Malignant neoplasm of upper-outer quadrant of right breast in female, estrogen receptor positive (HCC)  10/27/2022 Cancer Staging   Staging form: Breast, AJCC 8th Edition - Clinical stage from 10/27/2022: Stage 0 (cTis (DCIS), cN0, cM0, G3, ER+, PR-, HER2: Not Assessed) - Signed by Malachy Mood, MD on 11/03/2022 Stage prefix: Initial diagnosis Histologic grading system: 3 grade system   11/01/2022 Initial Diagnosis   Ductal carcinoma in situ (DCIS) of right breast   12/07/2022 Cancer Staging   Staging form: Breast, AJCC 8th Edition - Pathologic stage from 12/07/2022: Stage IA (pT1a, pN0, cM0, G2, ER+, PR-, HER2+) - Signed by Malachy Mood, MD on 12/20/2022 Stage prefix: Initial diagnosis Histologic grading system: 3 grade system Residual tumor (R): R0 - None   02/03/2023 -  Chemotherapy   Patient is on Treatment Plan : BREAST Paclitaxel +  Trastuzumab q7d / Trastuzumab q21d         Discussed the use of AI scribe software for clinical note transcription with the patient, who gave verbal consent to proceed.  History of Present Illness   The patient, a 67 year old female with a history of breast cancer, presents for a follow-up visit after her second surgery. She reports that the surgery went well and the incision is healing nicely, with only a little tenderness. She has been able to perform daily activities such as blow-drying her hair without any problems. The patient also mentions that she has started doing exercises for rehab. The patient is planning to go on a cruise with friends from church before starting her treatment.         All other systems were reviewed with the patient and are negative.  MEDICAL HISTORY:  Past Medical History:  Diagnosis Date   Anemia    Arthritis    Atypical chest pain    a. 05/2018 CTA chest: No PE; b. 05/2018 MV: No ischemia/scar. EF 65%.   Breast cancer [facility])    Coronary artery calcification seen on CT scan    a. 05/2018 CT Chest: Coronary and Ao atherosclerotic Calcifications.   Diabetes mellitus without complication (HCC)    Diastolic dysfunction    a. 05/2018 Echo: EF 60-65%, impaired relaxation. Nl RVSP.   Dysrhythmia    PVC's   Family history of adverse reaction to anesthesia    sister-malignant hyperthermia   FH of Malignant hyperthermia    a. sister had malginant hyperthermia   History of kidney stones    History of renal insufficiency    History of skin cancer    Hyperlipemia    Osteoporosis    Pulmonary nodules    a. 05/2018 CT Chest: Small pulm nodules measuring up to 6mm avg diameter. Rec non-contrast Chest CT in 6-12 mos.   Thyroid nodule     SURGICAL HISTORY: Past Surgical History:  Procedure Laterality Date   AXILLARY LYMPH NODE BIOPSY Right 01/12/2023   Procedure: RIGHT AXILLARY SENTINEL NODE BIOPSY;  Surgeon: Emelia Loron, MD;  Location: [facility] OR;  Service: General;  Laterality: Right;  LMA    BREAST BIOPSY Right 10/27/2022   MM RT BREAST BX W LOC DEV 1ST LESION IMAGE BX SPEC STEREO GUIDE 10/27/2022 [facility]   BREAST BIOPSY  12/06/2022   MM RT RADIOACTIVE SEED LOC MAMMO GUIDE 12/06/2022 [facility]   BREAST LUMPECTOMY WITH RADIOACTIVE SEED LOCALIZATION Right 12/07/2022   Procedure: RIGHT BREAST LUMPECTOMY WITH RADIOACTIVE SEED LOCALIZATION;  Surgeon: Emelia Loron, MD;  Location: [facility] OR;  Service: General;  Laterality: Right;   CESAREAN SECTION     CHOLECYSTECTOMY  2006   lap choli with umb hernia   COLONOSCOPY     HYSTEROSCOPY WITH D & C N/A 12/07/2022   Procedure: DILATATION AND CURETTAGE /HYSTEROSCOPY WITH POLYPECTOMY ENDOMETRIAL BIOPSY;  Surgeon: Waynard Reeds, MD;  Location: [facility] OR;  Service: Gynecology;  Laterality: N/A;   LIPOMA EXCISION Left 03/15/2014   Procedure: EXCISION LIPOMA LEFT LOWER QUARDRANT ABDOMINAL WALL;  Surgeon: Violeta Gelinas, MD;  Location: [facility];  Service: General;  Laterality: Left;   PORTACATH PLACEMENT N/A 01/12/2023   Procedure: PORT PLACEMENT WITH ULTRASOUND GUIDANCE;  Surgeon: Emelia Loron, MD;  Location: [facility] OR;  Service: General;  Laterality: N/A;   THYROIDECTOMY N/A 06/25/2019   Procedure: TOTAL THYROIDECTOMY;  Surgeon: Darnell Level, MD;  Location: [facility]S;  Service: General;  Laterality: N/A;   TONSILLECTOMY  TRIGGER FINGER RELEASE  2012   lt small,middle,long   TRIGGER FINGER RELEASE Right 07/14/2017   Procedure: RELEASE TRIGGER FINGER/A-1 PULLEY RIGHT THUMB;  Surgeon: Cindee Salt, MD;  Location: [facility];  Service: Orthopedics;  Laterality: Right;   UMBILICAL HERNIA REPAIR  2006   with lap choli    I have reviewed the social history and family history with the patient and they are unchanged from previous note.  ALLERGIES:  has No Known Allergies.  MEDICATIONS:  Current Outpatient Medications  Medication Sig Dispense Refill   aspirin EC 81 MG tablet Take 81 mg by mouth daily.      Insulin Glargine (BASAGLAR KWIKPEN) 100 UNIT/ML SOPN Inject 35 Units into the skin at bedtime.     insulin lispro (HUMALOG) 100 UNIT/ML injection Inject 10-15 Units into the skin 3 (three) times daily before meals. Sliding scale     levothyroxine (SYNTHROID) 112 MCG tablet Take 112 mcg by mouth daily before breakfast.     lidocaine-prilocaine (EMLA) cream Apply to affected area once 30 g 3   metFORMIN (GLUCOPHAGE) 1000 MG tablet Take 1,000 mg by mouth 2 (two) times daily with a meal.     metoprolol succinate (TOPROL-XL) 25 MG 24 hr tablet TAKE 1 TABLET (25 MG TOTAL) BY MOUTH DAILY. (Patient taking differently: Take 25 mg by mouth daily with supper.) 90 tablet 0   nitrofurantoin (MACRODANTIN) 100 MG capsule Take 100 mg by mouth daily.     Nutritional Supplements (JUICE PLUS FIBRE PO) Take 3 tablets by mouth daily. Juice Plus Vegetable     Nutritional Supplements (JUICE PLUS FIBRE PO) Take 3 tablets by mouth daily. Juice Plus Fruit     ondansetron (ZOFRAN) 8 MG tablet Take 1 tablet (8 mg total) by mouth every 8 (eight) hours as needed for nausea or vomiting. 30 tablet 1   prochlorperazine (COMPAZINE) 10 MG tablet Take 1 tablet (10 mg total) by mouth every 6 (six) hours as needed for nausea or vomiting. 30 tablet 1   ramipril (ALTACE) 5 MG capsule Take 5 mg by mouth daily with breakfast.     rosuvastatin (CRESTOR) 20 MG tablet Take 20 mg by mouth daily with supper.      traMADol (ULTRAM) 50 MG tablet Take 1 tablet (50 mg total) by mouth every 6 (six) hours as needed. 10 tablet 0   triamcinolone cream (KENALOG) 0.1 % Apply 1 Application topically daily as needed (dermatitis).     vitamin C (ASCORBIC ACID) 250 MG tablet Take 500 mg by mouth daily.     No current facility-administered medications for this visit.    PHYSICAL EXAMINATION: ECOG PERFORMANCE STATUS: 0 - Asymptomatic  Vitals:   01/20/23 1029  BP: 132/88  Pulse: 88  Resp: 18  Temp: 98.2 F (36.8 C)  SpO2: 98%   Wt Readings from  Last 3 Encounters:  01/20/23 176 lb 4.8 oz (80 kg)  01/12/23 178 lb (80.7 kg)  01/07/23 177 lb 4.8 oz (80.4 kg)     GENERAL:alert, no distress and comfortable SKIN: skin color, texture, turgor are normal, no rashes or significant lesions EYES: normal, Conjunctiva are pink and non-injected, sclera clear NECK: supple, thyroid normal size, non-tender, without nodularity LYMPH:  no palpable lymphadenopathy in the cervical, axillary  LUNGS: clear to auscultation and percussion with normal breathing effort HEART: regular rate & rhythm and no murmurs and no lower extremity edema ABDOMEN:abdomen soft, non-tender and normal bowel sounds Musculoskeletal:no cyanosis of digits and no clubbing  NEURO: alert & oriented x 3 with fluent speech, no focal motor/sensory deficits Breasts: Breast inspection showed them to be symmetrical with no nipple discharge. Palpation of the breasts and axilla revealed no obvious mass that I could appreciate.  2 incisions in her right axilla and breast have healed well, minimal skin erythema around the axillary incision.   LABORATORY DATA:  I have reviewed the data as listed    Latest Ref Rng & Units 01/07/2023    1:51 PM 12/01/2022    9:30 AM 11/03/2022    8:17 AM  CBC  WBC 4.0 - 10.5 K/uL 8.7  6.0  5.4   Hemoglobin 12.0 - 15.0 g/dL 32.4  40.1  02.7   Hematocrit 36.0 - 46.0 % 43.0  43.0  41.7   Platelets 150 - 400 K/uL 338  314  299         Latest Ref Rng & Units 01/07/2023    1:51 PM 12/01/2022    9:30 AM 11/03/2022    8:17 AM  CMP  Glucose 70 - 99 mg/dL 253  664  403   BUN 8 - 23 mg/dL 6  8  9    Creatinine 0.44 - 1.00 mg/dL 4.74  2.59  5.63   Sodium 135 - 145 mmol/L 137  135  137   Potassium 3.5 - 5.1 mmol/L 4.4  3.9  3.4   Chloride 98 - 111 mmol/L 100  98  99   CO2 22 - 32 mmol/L 25  27  25    Calcium 8.9 - 10.3 mg/dL 9.6  9.7  9.4   Total Protein 6.5 - 8.1 g/dL   7.4   Total Bilirubin 0.3 - 1.2 mg/dL   0.5   Alkaline Phos 38 - 126 U/L   68   AST 15 - 41  U/L   11   ALT 0 - 44 U/L   14       RADIOGRAPHIC STUDIES: I have personally reviewed the radiological images as listed and agreed with the findings in the report. No results found.    Orders Placed This Encounter  Procedures   CBC with Differential (Cancer Center Only)    Standing Status:   Future    Standing Expiration Date:   02/03/2024   CMP (Cancer Center only)    Standing Status:   Future    Standing Expiration Date:   02/03/2024   CBC with Differential (Cancer Center Only)    Standing Status:   Future    Standing Expiration Date:   02/10/2024   CMP (Cancer Center only)    Standing Status:   Future    Standing Expiration Date:   02/10/2024   CBC with Differential (Cancer Center Only)    Standing Status:   Future    Standing Expiration Date:   02/17/2024   CMP (Cancer Center only)    Standing Status:   Future    Standing Expiration Date:   02/17/2024   CBC with Differential (Cancer Center Only)    Standing Status:   Future    Standing Expiration Date:   02/24/2024   CMP (Cancer Center only)    Standing Status:   Future    Standing Expiration Date:   02/24/2024   CBC with Differential (Cancer Center Only)    Standing Status:   Future    Standing Expiration Date:   03/02/2024   CMP (Cancer Center only)    Standing Status:   Future    Standing Expiration  Date:   03/02/2024   CBC with Differential (Cancer Center Only)    Standing Status:   Future    Standing Expiration Date:   03/09/2024   CMP (Cancer Center only)    Standing Status:   Future    Standing Expiration Date:   03/09/2024   CBC with Differential (Cancer Center Only)    Standing Status:   Future    Standing Expiration Date:   03/16/2024   CMP (Cancer Center only)    Standing Status:   Future    Standing Expiration Date:   03/16/2024   CBC with Differential (Cancer Center Only)    Standing Status:   Future    Standing Expiration Date:   03/23/2024   CMP (Cancer Center only)    Standing Status:    Future    Standing Expiration Date:   03/23/2024   All questions were answered. The patient knows to call the clinic with any problems, questions or concerns. No barriers to learning was detected. The total time spent in the appointment was 25 minutes.     Malachy Mood, MD 01/20/2023

## 2023-01-22 ENCOUNTER — Other Ambulatory Visit: Payer: Self-pay

## 2023-01-25 ENCOUNTER — Ambulatory Visit: Payer: Medicare Other | Admitting: Hematology

## 2023-01-26 ENCOUNTER — Encounter: Payer: Self-pay | Admitting: *Deleted

## 2023-01-27 ENCOUNTER — Encounter: Payer: Self-pay | Admitting: Hematology

## 2023-01-28 ENCOUNTER — Encounter: Payer: Self-pay | Admitting: Hematology

## 2023-01-30 ENCOUNTER — Other Ambulatory Visit: Payer: Self-pay

## 2023-01-31 ENCOUNTER — Other Ambulatory Visit: Payer: Medicare Other

## 2023-01-31 ENCOUNTER — Inpatient Hospital Stay: Payer: Medicare Other | Admitting: Pharmacist

## 2023-01-31 ENCOUNTER — Inpatient Hospital Stay: Payer: Medicare Other

## 2023-01-31 ENCOUNTER — Inpatient Hospital Stay: Payer: Medicare Other | Attending: Hematology

## 2023-01-31 DIAGNOSIS — Z5112 Encounter for antineoplastic immunotherapy: Secondary | ICD-10-CM | POA: Insufficient documentation

## 2023-01-31 DIAGNOSIS — Z170 Estrogen receptor positive status [ER+]: Secondary | ICD-10-CM

## 2023-01-31 DIAGNOSIS — Z79899 Other long term (current) drug therapy: Secondary | ICD-10-CM | POA: Diagnosis not present

## 2023-01-31 DIAGNOSIS — C50411 Malignant neoplasm of upper-outer quadrant of right female breast: Secondary | ICD-10-CM | POA: Insufficient documentation

## 2023-01-31 DIAGNOSIS — Z5111 Encounter for antineoplastic chemotherapy: Secondary | ICD-10-CM | POA: Diagnosis present

## 2023-01-31 LAB — CBC WITH DIFFERENTIAL (CANCER CENTER ONLY)
Abs Immature Granulocytes: 0.05 10*3/uL (ref 0.00–0.07)
Basophils Absolute: 0.1 10*3/uL (ref 0.0–0.1)
Basophils Relative: 1 %
Eosinophils Absolute: 0.2 10*3/uL (ref 0.0–0.5)
Eosinophils Relative: 2 %
HCT: 40.1 % (ref 36.0–46.0)
Hemoglobin: 13.5 g/dL (ref 12.0–15.0)
Immature Granulocytes: 1 %
Lymphocytes Relative: 18 %
Lymphs Abs: 1.6 10*3/uL (ref 0.7–4.0)
MCH: 29 pg (ref 26.0–34.0)
MCHC: 33.7 g/dL (ref 30.0–36.0)
MCV: 86.2 fL (ref 80.0–100.0)
Monocytes Absolute: 0.8 10*3/uL (ref 0.1–1.0)
Monocytes Relative: 9 %
Neutro Abs: 6.5 10*3/uL (ref 1.7–7.7)
Neutrophils Relative %: 69 %
Platelet Count: 317 10*3/uL (ref 150–400)
RBC: 4.65 MIL/uL (ref 3.87–5.11)
RDW: 12.9 % (ref 11.5–15.5)
WBC Count: 9.2 10*3/uL (ref 4.0–10.5)
nRBC: 0 % (ref 0.0–0.2)

## 2023-01-31 LAB — CMP (CANCER CENTER ONLY)
ALT: 10 U/L (ref 0–44)
AST: 10 U/L — ABNORMAL LOW (ref 15–41)
Albumin: 4.4 g/dL (ref 3.5–5.0)
Alkaline Phosphatase: 71 U/L (ref 38–126)
Anion gap: 6 (ref 5–15)
BUN: 9 mg/dL (ref 8–23)
CO2: 31 mmol/L (ref 22–32)
Calcium: 9.9 mg/dL (ref 8.9–10.3)
Chloride: 97 mmol/L — ABNORMAL LOW (ref 98–111)
Creatinine: 0.58 mg/dL (ref 0.44–1.00)
GFR, Estimated: >60 mL/min (ref >60)
Glucose, Bld: 186 mg/dL — ABNORMAL HIGH (ref 70–99)
Potassium: 3.9 mmol/L (ref 3.5–5.1)
Sodium: 134 mmol/L — ABNORMAL LOW (ref 135–145)
Total Bilirubin: 0.6 mg/dL (ref <1.2)
Total Protein: 7.4 g/dL (ref 6.5–8.1)

## 2023-01-31 NOTE — Progress Notes (Signed)
[facility]       Telephone: [phone]?Fax: [phone]   Oncology Clinical Pharmacist Practitioner Initial Assessment  [name] is a 67 y.o. female with a diagnosis of breast cancer. They were contacted today via in-person visit.  Indication/Regimen Trastuzumab (Herceptin) and Nab-pacitaxel (Abraxane) are being used appropriately for treatment of breast cancer by Dr. Malachy Mood.      Wt Readings from Last 1 Encounters:  01/20/23 176 lb 4.8 oz (80 kg)    Estimated body surface area is 1.93 meters squared as calculated from the following:   Height as of 01/20/23: 5\' 6"  (1.676 m).   Weight as of 01/20/23: 176 lb 4.8 oz (80 kg).  The dosing regimen is weekly (Day 1, Day 8, Day 15, Day 22) for 3 cycles  Trastuzumab (4 mg/kg load, 2 mg/kg maintenance) on Day 1 Nab-paclitaxel (80 mg/m2) on Day 1  Followed by a dosing regimen that is every 21 days for 13 cycles (starting week 13)  Trastuzumab (6 mg/kg maintenance) on Day 1  Dose Modifications Nab-paclitaxel will be dosed at 80 mg/m2   Allergies No Known Allergies  Vitals: No vitals or labs were done today for this chemotherapy education visit   Contraindications Contraindications were reviewed? Yes Contraindications to therapy were identified? No   Safety Precautions (written information also provided) The following safety precautions for the use of trastuzumab + nab-paclitaxel were reviewed:  Fever: reviewed the importance of having a thermometer and the Centers for Disease Control and Prevention (CDC) definition of fever which is 100.64F (38C) or higher. Patient should call 24/7 triage at [phone] if experiencing a fever or any other symptoms Decreased white blood cells (WBCs) and increased risk for infection Decreased platelet count and increased risk of bleeding Decreased hemoglobin, part of the red blood cells that carry iron and oxygen Nausea or vomiting Diarrhea or constipation Hair  Loss Fatigue Changes in liver function Peripheral Neuropathy Muscle or joint pain or weakness Mouth Irritation or sores Nail Changes Hypersensitivity reactions Cardiotoxicity Pneumonitis Irritant Headache Handling body fluids and waste Intimacy, sexual activity, contraception, and fertility  Medication Reconciliation Current Outpatient Medications  Medication Sig Dispense Refill   aspirin EC 81 MG tablet Take 81 mg by mouth daily.     Insulin Glargine (BASAGLAR KWIKPEN) 100 UNIT/ML SOPN Inject 35 Units into the skin at bedtime.     insulin lispro (HUMALOG) 100 UNIT/ML injection Inject 10-15 Units into the skin 3 (three) times daily before meals. Sliding scale     levothyroxine (SYNTHROID) 112 MCG tablet Take 112 mcg by mouth daily before breakfast.     lidocaine-prilocaine (EMLA) cream Apply to affected area once 30 g 3   metFORMIN (GLUCOPHAGE) 1000 MG tablet Take 1,000 mg by mouth 2 (two) times daily with a meal.     metoprolol succinate (TOPROL-XL) 25 MG 24 hr tablet TAKE 1 TABLET (25 MG TOTAL) BY MOUTH DAILY. (Patient taking differently: Take 25 mg by mouth daily with supper.) 90 tablet 0   nitrofurantoin (MACRODANTIN) 100 MG capsule Take 100 mg by mouth daily.     Nutritional Supplements (JUICE PLUS FIBRE PO) Take 3 tablets by mouth daily. Juice Plus Vegetable     Nutritional Supplements (JUICE PLUS FIBRE PO) Take 3 tablets by mouth daily. Juice Plus Fruit     ondansetron (ZOFRAN) 8 MG tablet Take 1 tablet (8 mg total) by mouth every 8 (eight) hours as needed for nausea or vomiting. 30 tablet 1  prochlorperazine (COMPAZINE) 10 MG tablet Take 1 tablet (10 mg total) by mouth every 6 (six) hours as needed for nausea or vomiting. 30 tablet 1   ramipril (ALTACE) 5 MG capsule Take 5 mg by mouth daily with breakfast.     rosuvastatin (CRESTOR) 20 MG tablet Take 20 mg by mouth daily with supper.      traMADol (ULTRAM) 50 MG tablet Take 1 tablet (50 mg total) by mouth every 6 (six) hours  as needed. 10 tablet 0   triamcinolone cream (KENALOG) 0.1 % Apply 1 Application topically daily as needed (dermatitis).     vitamin C (ASCORBIC ACID) 250 MG tablet Take 500 mg by mouth daily.     No current facility-administered medications for this visit.   Medication reconciliation is based on the patient's most recent medication list in the electronic medical record (EMR) including herbal products and OTC medications.   The patient's medication list was reviewed today with the patient? Yes   Drug-drug interactions (DDIs) DDIs were evaluated? Yes Significant DDIs identified? No   Drug-Food Interactions Drug-food interactions were evaluated? Yes Drug-food interactions identified? No   Follow-up Plan  Treatment start date: 02/02/23 Port placement date: 01/12/23 ECHO date: 12/21/22 We reviewed the prescriptions, premedications, and treatment regimen with the patient. Possible side effects of the treatment regimen were reviewed and management strategies were discussed  Can use loperamide as needed for diarrhea and Senna-S as needed for constipation. Clinical pharmacy will assist Dr. Malachy Mood and Early Osmond on an as needed basis going forward  Early Osmond participated in the discussion, expressed understanding, and voiced agreement with the above plan. All questions were answered to her satisfaction. The patient was advised to contact the clinic at [phone] with any questions or concerns prior to her return visit.   I spent 40 minutes assessing the patient.  Dondrea Clendenin A. Odetta Pink, PharmD, BCOP, CPP  Anselm Lis, RPH-CPP, 01/31/2023 1:56 PM  **Disclaimer: This note was dictated with voice recognition software. Similar sounding words can inadvertently be transcribed and this note may contain transcription errors which may not have been corrected upon publication of note.**

## 2023-02-02 ENCOUNTER — Ambulatory Visit: Payer: Medicare Other | Admitting: Rehabilitation

## 2023-02-02 ENCOUNTER — Encounter: Payer: Self-pay | Admitting: Nurse Practitioner

## 2023-02-02 ENCOUNTER — Inpatient Hospital Stay: Payer: Medicare Other

## 2023-02-02 ENCOUNTER — Inpatient Hospital Stay: Payer: Medicare Other | Admitting: Nurse Practitioner

## 2023-02-02 VITALS — BP 136/80 | HR 96 | Temp 98.2°F | Resp 18 | Ht 66.0 in | Wt 176.2 lb

## 2023-02-02 DIAGNOSIS — Z170 Estrogen receptor positive status [ER+]: Secondary | ICD-10-CM

## 2023-02-02 DIAGNOSIS — C50411 Malignant neoplasm of upper-outer quadrant of right female breast: Secondary | ICD-10-CM | POA: Diagnosis not present

## 2023-02-02 DIAGNOSIS — Z5112 Encounter for antineoplastic immunotherapy: Secondary | ICD-10-CM | POA: Diagnosis not present

## 2023-02-02 MED ORDER — DIPHENHYDRAMINE HCL 50 MG/ML IJ SOLN
25.0000 mg | Freq: Once | INTRAMUSCULAR | Status: AC
Start: 2023-02-02 — End: 2023-02-02
  Administered 2023-02-02: 25 mg via INTRAVENOUS
  Filled 2023-02-02: qty 1

## 2023-02-02 MED ORDER — PACLITAXEL PROTEIN-BOUND CHEMO INJECTION 100 MG
80.0000 mg/m2 | Freq: Once | INTRAVENOUS | Status: AC
  Administered 2023-02-02: 150 mg via INTRAVENOUS
  Filled 2023-02-02: qty 30

## 2023-02-02 MED ORDER — TRASTUZUMAB-ANNS CHEMO 150 MG IV SOLR
4.0000 mg/kg | Freq: Once | INTRAVENOUS | Status: AC
  Administered 2023-02-02: 300 mg via INTRAVENOUS
  Filled 2023-02-02: qty 14.29

## 2023-02-02 MED ORDER — SODIUM CHLORIDE 0.9 % IV SOLN: Freq: Once | INTRAVENOUS | Status: AC

## 2023-02-02 MED ORDER — HEPARIN SOD (PORK) LOCK FLUSH 100 UNIT/ML IV SOLN
500.0000 [IU] | Freq: Once | INTRAVENOUS | Status: AC | PRN
  Administered 2023-02-02: 500 [IU]

## 2023-02-02 MED ORDER — SODIUM CHLORIDE 0.9% FLUSH
10.0000 mL | INTRAVENOUS | Status: DC | PRN
  Administered 2023-02-02: 10 mL

## 2023-02-02 MED ORDER — PROCHLORPERAZINE MALEATE 10 MG PO TABS
10.0000 mg | ORAL_TABLET | Freq: Once | ORAL | Status: AC
  Administered 2023-02-02: 10 mg via ORAL
  Filled 2023-02-02: qty 1

## 2023-02-02 MED ORDER — ACETAMINOPHEN 325 MG PO TABS
650.0000 mg | ORAL_TABLET | Freq: Once | ORAL | Status: AC
  Administered 2023-02-02: 650 mg via ORAL
  Filled 2023-02-02: qty 2

## 2023-02-02 NOTE — Patient Instructions (Signed)
[facility] - A DEPT OF [facility]  Discharge Instructions: Thank you for choosing [facility] to provide your oncology and hematology care.   If you have a lab appointment with the Cancer Center, please go directly to the Cancer Center and check in at the registration area.   Wear comfortable clothing and clothing appropriate for easy access to any Portacath or PICC line.   We strive to give you quality time with your provider. You may need to reschedule your appointment if you arrive late (15 or more minutes).  Arriving late affects you and other patients whose appointments are after yours.  Also, if you miss three or more appointments without notifying the office, you may be dismissed from the clinic at the provider's discretion.      For prescription refill requests, have your pharmacy contact our office and allow 72 hours for refills to be completed.    Today you received the following chemotherapy and/or immunotherapy agents Trastuzumab xxxx, Abraxane      To help prevent nausea and vomiting after your treatment, we encourage you to take your nausea medication as directed.  BELOW ARE SYMPTOMS THAT SHOULD BE REPORTED IMMEDIATELY: *FEVER GREATER THAN 100.4 F (38 C) OR HIGHER *CHILLS OR SWEATING *NAUSEA AND VOMITING THAT IS NOT CONTROLLED WITH YOUR NAUSEA MEDICATION *UNUSUAL SHORTNESS OF BREATH *UNUSUAL BRUISING OR BLEEDING *URINARY PROBLEMS (pain or burning when urinating, or frequent urination) *BOWEL PROBLEMS (unusual diarrhea, constipation, pain near the anus) TENDERNESS IN MOUTH AND THROAT WITH OR WITHOUT PRESENCE OF ULCERS (sore throat, sores in mouth, or a toothache) UNUSUAL RASH, SWELLING OR PAIN  UNUSUAL VAGINAL DISCHARGE OR ITCHING   Items with * indicate a potential emergency and should be followed up as soon as possible or go to the Emergency Department if any problems should occur.  Please show the CHEMOTHERAPY ALERT CARD  or IMMUNOTHERAPY ALERT CARD at check-in to the Emergency Department and triage nurse.  Should you have questions after your visit or need to cancel or reschedule your appointment, please contact [facility] - A DEPT OF [facility]  Dept: [phone]  and follow the prompts.  Office hours are 8:00 a.m. to 4:30 p.m. Monday - Friday. Please note that voicemails left after 4:00 p.m. may not be returned until the following business day.  We are closed weekends and major holidays. You have access to a nurse at all times for urgent questions. Please call the main number to the clinic Dept: [phone] and follow the prompts.   For any non-urgent questions, you may also contact your provider using MyChart. We now offer e-Visits for anyone 41 and older to request care online for non-urgent symptoms. For details visit [email or web address].   Also download the MyChart app! Go to the app store, search "MyChart", open the app, select [place], and log in with your MyChart username and password.  Trastuzumab Injection What is this medication? TRASTUZUMAB (tras TOO zoo mab) treats breast cancer and stomach cancer. It works by blocking a protein that causes cancer cells to grow and multiply. This helps to slow or stop the spread of cancer cells. This medicine may be used for other purposes; ask your health care provider or pharmacist if you have questions. COMMON BRAND NAME(S): Herceptin, Marlowe Alt, Ontruzant, Trazimera What should I tell my care team before I take this medication? They need to know if you have any of these conditions: Heart  failure Lung disease An unusual or allergic reaction to trastuzumab, other medications, foods, dyes, or preservatives Pregnant or trying to get pregnant Breast-feeding How should I use this medication? This medication is injected into a vein. It is given by your care team in a hospital or clinic setting. Talk to  your care team about the use of this medication in children. It is not approved for use in children. Overdosage: If you think you have taken too much of this medicine contact a poison control center or emergency room at once. NOTE: This medicine is only for you. Do not share this medicine with others. What if I miss a dose? Keep appointments for follow-up doses. It is important not to miss your dose. Call your care team if you are unable to keep an appointment. What may interact with this medication? Certain types of chemotherapy, such as daunorubicin, doxorubicin, epirubicin, idarubicin This list may not describe all possible interactions. Give your health care provider a list of all the medicines, herbs, non-prescription drugs, or dietary supplements you use. Also tell them if you smoke, drink alcohol, or use illegal drugs. Some items may interact with your medicine. What should I watch for while using this medication? Your condition will be monitored carefully while you are receiving this medication. This medication may make you feel generally unwell. This is not uncommon, as chemotherapy affects healthy cells as well as cancer cells. Report any side effects. Continue your course of treatment even though you feel ill unless your care team tells you to stop. This medication may increase your risk of getting an infection. Call your care team for advice if you get a fever, chills, sore throat, or other symptoms of a cold or flu. Do not treat yourself. Try to avoid being around people who are sick. Avoid taking medications that contain aspirin, acetaminophen, ibuprofen, naproxen, or ketoprofen unless instructed by your care team. These medications can hide a fever. Talk to your care team if you may be pregnant. Serious birth defects can occur if you take this medication during pregnancy and for 7 months after the last dose. You will need a negative pregnancy test before starting this medication.  Contraception is recommended while taking this medication and for 7 months after the last dose. Your care team can help you find the option that works for you. Do not breastfeed while taking this medication and for 7 months after stopping treatment. What side effects may I notice from receiving this medication? Side effects that you should report to your care team as soon as possible: Allergic reactions or angioedema--skin rash, itching or hives, swelling of the face, eyes, lips, tongue, arms, or legs, trouble swallowing or breathing Dry cough, shortness of breath or trouble breathing Heart failure--shortness of breath, swelling of the ankles, feet, or hands, sudden weight gain, unusual weakness or fatigue Infection--fever, chills, cough, or sore throat Infusion reactions--chest pain, shortness of breath or trouble breathing, feeling faint or lightheaded Side effects that usually do not require medical attention (report to your care team if they continue or are bothersome): Diarrhea Dizziness Headache Nausea Trouble sleeping Vomiting This list may not describe all possible side effects. Call your doctor for medical advice about side effects. You may report side effects to FDA at 1-800-FDA-1088. Where should I keep my medication? This medication is given in a hospital or clinic. It will not be stored at home. NOTE: This sheet is a summary. It may not cover all possible information. If you have  questions about this medicine, talk to your doctor, pharmacist, or health care provider.  2024 Elsevier/Gold Standard (2021-07-28 00:00:00)  Paclitaxel Nanoparticle Albumin-Bound Injection What is this medication? NANOPARTICLE ALBUMIN-BOUND PACLITAXEL (Na no PAHR ti kuhl al BYOO muhn-bound PAK li TAX el) treats some types of cancer. It works by slowing down the growth of cancer cells. This medicine may be used for other purposes; ask your health care provider or pharmacist if you have  questions. COMMON BRAND NAME(S): Abraxane What should I tell my care team before I take this medication? They need to know if you have any of these conditions: Liver disease Low white blood cell levels An unusual or allergic reaction to paclitaxel, albumin, other medications, foods, dyes, or preservatives If you or your partner are pregnant or trying to get pregnant Breast-feeding How should I use this medication? This medication is injected into a vein. It is given by your care team in a hospital or clinic setting. Talk to your care team about the use of this medication in children. Special care may be needed. Overdosage: If you think you have taken too much of this medicine contact a poison control center or emergency room at once. NOTE: This medicine is only for you. Do not share this medicine with others. What if I miss a dose? Keep appointments for follow-up doses. It is important not to miss your dose. Call your care team if you are unable to keep an appointment. What may interact with this medication? Other medications may affect the way this medication works. Talk with your care team about all of the medications you take. They may suggest changes to your treatment plan to lower the risk of side effects and to make sure your medications work as intended. This list may not describe all possible interactions. Give your health care provider a list of all the medicines, herbs, non-prescription drugs, or dietary supplements you use. Also tell them if you smoke, drink alcohol, or use illegal drugs. Some items may interact with your medicine. What should I watch for while using this medication? Your condition will be monitored carefully while you are receiving this medication. You may need blood work while taking this medication. This medication may make you feel generally unwell. This is not uncommon as chemotherapy can affect healthy cells as well as cancer cells. Report any side effects.  Continue your course of treatment even though you feel ill unless your care team tells you to stop. This medication can cause serious allergic reactions. To reduce the risk, your care team may give you other medications to take before receiving this one. Be sure to follow the directions from your care team. This medication may increase your risk of getting an infection. Call your care team for advice if you get a fever, chills, sore throat, or other symptoms of a cold or flu. Do not treat yourself. Try to avoid being around people who are sick. This medication may increase your risk to bruise or bleed. Call your care team if you notice any unusual bleeding. Be careful brushing or flossing your teeth or using a toothpick because you may get an infection or bleed more easily. If you have any dental work done, tell your dentist you are receiving this medication. Talk to your care team if you or your partner may be pregnant. Serious birth defects can occur if you take this medication during pregnancy and for 6 months after the last dose. You will need a negative pregnancy test before  starting this medication. Contraception is recommended while taking this medication and for 6 months after the last dose. Your care team can help you find the option that works for you. If your partner can get pregnant, use a condom during sex while taking this medication and for 3 months after the last dose. Do not breastfeed while taking this medication and for 2 weeks after the last dose. This medication may cause infertility. Talk to your care team if you are concerned about your fertility. What side effects may I notice from receiving this medication? Side effects that you should report to your care team as soon as possible: Allergic reactions--skin rash, itching, hives, swelling of the face, lips, tongue, or throat Dry cough, shortness of breath or trouble breathing Infection--fever, chills, cough, sore throat, wounds  that don't heal, pain or trouble when passing urine, general feeling of discomfort or being unwell Low red blood cell level--unusual weakness or fatigue, dizziness, headache, trouble breathing Pain, tingling, or numbness in the hands or feet Stomach pain, unusual weakness or fatigue, nausea, vomiting, diarrhea, or fever that lasts longer than expected Unusual bruising or bleeding Side effects that usually do not require medical attention (report to your care team if they continue or are bothersome): Diarrhea Fatigue Hair loss Loss of appetite Nausea Vomiting This list may not describe all possible side effects. Call your doctor for medical advice about side effects. You may report side effects to FDA at 1-800-FDA-1088. Where should I keep my medication? This medication is given in a hospital or clinic. It will not be stored at home. NOTE: This sheet is a summary. It may not cover all possible information. If you have questions about this medicine, talk to your doctor, pharmacist, or health care provider.  2024 Elsevier/Gold Standard (2021-07-30 00:00:00)

## 2023-02-02 NOTE — Progress Notes (Signed)
Patient Care Team: Tisovec, Adelfa Koh, MD as PCP - General (Internal Medicine) Iran Ouch, MD as PCP - Cardiology (Cardiology) Malachy Mood, MD as Consulting Physician (Hematology) Emelia Loron, MD as Consulting Physician (General Surgery) Dorothy Puffer, MD as Consulting Physician (Radiation Oncology) Pershing Proud, RN as Oncology Nurse Navigator Donnelly Angelica, RN as Oncology Nurse Navigator   CHIEF COMPLAINT: Follow-up right breast cancer  Oncology History  Malignant neoplasm of upper-outer quadrant of right breast in female, estrogen receptor positive (HCC)  10/27/2022 Cancer Staging   Staging form: Breast, AJCC 8th Edition - Clinical stage from 10/27/2022: Stage 0 (cTis (DCIS), cN0, cM0, G3, ER+, PR-, HER2: Not Assessed) - Signed by Malachy Mood, MD on 11/03/2022 Stage prefix: Initial diagnosis Histologic grading system: 3 grade system   11/01/2022 Initial Diagnosis   Ductal carcinoma in situ (DCIS) of right breast   12/07/2022 Cancer Staging   Staging form: Breast, AJCC 8th Edition - Pathologic stage from 12/07/2022: Stage IA (pT1a, pN0, cM0, G2, ER+, PR-, HER2+) - Signed by Malachy Mood, MD on 12/20/2022 Stage prefix: Initial diagnosis Histologic grading system: 3 grade system Residual tumor (R): R0 - None   02/02/2023 -  Chemotherapy   Patient is on Treatment Plan : BREAST Abraxane + Trastuzumab q7d / Trastuzumab q21d        CURRENT THERAPY: Adjuvant weekly Abraxane and trastuzumab, followed by maintenance trastuzumab to complete 1 year  INTERVAL HISTORY [name] returns for follow-up and treatment as scheduled, last seen by Dr. Mosetta Putt 01/20/2023. She went on a cruise and came back with a mild "hacky" cough but feels completely well. Had thyroid removed so she sounds horse at baseline. Denies fever/chills. Saw surgeon who cleared her. Denies baseline neuropathy.   ROS  All other systems reviewed and negative   Past Medical History:  Diagnosis Date   Anemia     Arthritis    Atypical chest pain    a. 05/2018 CTA chest: No PE; b. 05/2018 MV: No ischemia/scar. EF 65%.   Breast cancer [facility])    Coronary artery calcification seen on CT scan    a. 05/2018 CT Chest: Coronary and Ao atherosclerotic Calcifications.   Diabetes mellitus without complication (HCC)    Diastolic dysfunction    a. 05/2018 Echo: EF 60-65%, impaired relaxation. Nl RVSP.   Dysrhythmia    PVC's   Family history of adverse reaction to anesthesia    sister-malignant hyperthermia   FH of Malignant hyperthermia    a. sister had malginant hyperthermia   History of kidney stones    History of renal insufficiency    History of skin cancer    Hyperlipemia    Osteoporosis    Pulmonary nodules    a. 05/2018 CT Chest: Small pulm nodules measuring up to 6mm avg diameter. Rec non-contrast Chest CT in 6-12 mos.   Thyroid nodule      Past Surgical History:  Procedure Laterality Date   AXILLARY LYMPH NODE BIOPSY Right 01/12/2023   Procedure: RIGHT AXILLARY SENTINEL NODE BIOPSY;  Surgeon: Emelia Loron, MD;  Location: [facility] OR;  Service: General;  Laterality: Right;  LMA   BREAST BIOPSY Right 10/27/2022   MM RT BREAST BX W LOC DEV 1ST LESION IMAGE BX SPEC STEREO GUIDE 10/27/2022 [facility]MO[facility]   BREAST BIOPSY  12/06/2022   MM RT RADIOACTIVE SEED LOC MAMMO GUIDE 12/06/2022 [facility]MO[facility]   BREAST LUMPECTOMY WITH RADIOACTIVE SEED LOCALIZATION Right 12/07/2022   Procedure: RIGHT BREAST LUMPECTOMY WITH RADIOACTIVE  SEED LOCALIZATION;  Surgeon: Emelia Loron, MD;  Location[facility] OR;  Service: General;  Laterality: Right;   CESAREAN SECTION     CHOLECYSTECTOMY  2006   lap choli with umb hernia   COLONOSCOPY     HYSTEROSCOPY WITH D & C N/A 12/07/2022   Procedure: DILATATION AND CURETTAGE /HYSTEROSCOPY WITH POLYPECTOMY ENDOMETRIAL BIOPSY;  Surgeon: Waynard Reeds, MD;  Location: [facility] OR;  Service: Gynecology;  Laterality: N/A;   LIPOMA EXCISION Left 03/15/2014   Procedure: EXCISION LIPOMA LEFT  LOWER QUARDRANT ABDOMINAL WALL;  Surgeon: Violeta Gelinas, MD;  Location: [facility];  Service: General;  Laterality: Left;   PORTACATH PLACEMENT N/A 01/12/2023   Procedure: PORT PLACEMENT WITH ULTRASOUND GUIDANCE;  Surgeon: Emelia Loron, MD;  Location: [facility] OR;  Service: General;  Laterality: N/A;   THYROIDECTOMY N/A 06/25/2019   Procedure: TOTAL THYROIDECTOMY;  Surgeon: Darnell Level, MD;  Location: [facility] ORS;  Service: General;  Laterality: N/A;   TONSILLECTOMY     TRIGGER FINGER RELEASE  2012   lt small,middle,long   TRIGGER FINGER RELEASE Right 07/14/2017   Procedure: RELEASE TRIGGER FINGER/A-1 PULLEY RIGHT THUMB;  Surgeon: Cindee Salt, MD;  Location: [facility];  Service: Orthopedics;  Laterality: Right;   UMBILICAL HERNIA REPAIR  2006   with lap choli     Outpatient Encounter Medications as of 02/02/2023  Medication Sig Note   aspirin EC 81 MG tablet Take 81 mg by mouth daily.    Insulin Glargine (BASAGLAR KWIKPEN) 100 UNIT/ML SOPN Inject 35 Units into the skin at bedtime.    insulin lispro (HUMALOG) 100 UNIT/ML injection Inject 10-15 Units into the skin 3 (three) times daily before meals. Sliding scale    levothyroxine (SYNTHROID) 112 MCG tablet Take 112 mcg by mouth daily before breakfast.    lidocaine-prilocaine (EMLA) cream Apply to affected area once (Patient not taking: Reported on 01/31/2023) 12/31/2022: Hasn't started   metFORMIN (GLUCOPHAGE) 1000 MG tablet Take 1,000 mg by mouth 2 (two) times daily with a meal.    metoprolol succinate (TOPROL-XL) 25 MG 24 hr tablet TAKE 1 TABLET (25 MG TOTAL) BY MOUTH DAILY. (Patient taking differently: Take 25 mg by mouth daily with supper.)    nitrofurantoin (MACRODANTIN) 100 MG capsule Take 100 mg by mouth daily.    Nutritional Supplements (JUICE PLUS FIBRE PO) Take 3 tablets by mouth daily. Juice Plus Vegetable    Nutritional Supplements (JUICE PLUS FIBRE PO) Take 3 tablets by mouth daily. Juice Plus Fruit     ondansetron (ZOFRAN) 8 MG tablet Take 1 tablet (8 mg total) by mouth every 8 (eight) hours as needed for nausea or vomiting. (Patient not taking: Reported on 01/31/2023) 12/31/2022: Hasn't started   prochlorperazine (COMPAZINE) 10 MG tablet Take 1 tablet (10 mg total) by mouth every 6 (six) hours as needed for nausea or vomiting. (Patient not taking: Reported on 01/31/2023) 12/31/2022: Hasn't started   ramipril (ALTACE) 5 MG capsule Take 5 mg by mouth daily with breakfast.    rosuvastatin (CRESTOR) 20 MG tablet Take 20 mg by mouth daily with supper.     triamcinolone cream (KENALOG) 0.1 % Apply 1 Application topically daily as needed (dermatitis). (Patient not taking: Reported on 01/31/2023)    Facility-Administered Encounter Medications as of 02/02/2023  Medication   [COMPLETED] 0.9 %  sodium chloride infusion   [COMPLETED] acetaminophen (TYLENOL) tablet 650 mg   [COMPLETED] diphenhydrAMINE (BENADRYL) injection 25 mg   PACLitaxel-protein bound (ABRAXANE) chemo infusion 150 mg   [COMPLETED]  prochlorperazine (COMPAZINE) tablet 10 mg   trastuzumab-anns (KANJINTI) 300 mg in sodium chloride 0.9 % 250 mL chemo infusion     There were no vitals filed for this visit. There is no height or weight on file to calculate BMI.   PHYSICAL EXAM GENERAL:alert, no distress and comfortable SKIN: no rash  EYES: sclera clear LUNGS: clear with normal breathing effort HEART: regular rate & rhythm, no lower extremity edema NEURO: alert & oriented x 3 with fluent speech Breast exam: deferred PAC without erythema    CBC    Component Value Date/Time   WBC 9.2 01/31/2023 1612   WBC 8.7 01/07/2023 1351   RBC 4.65 01/31/2023 1612   HGB 13.5 01/31/2023 1612   HCT 40.1 01/31/2023 1612   PLT 317 01/31/2023 1612   MCV 86.2 01/31/2023 1612   MCH 29.0 01/31/2023 1612   MCHC 33.7 01/31/2023 1612   RDW 12.9 01/31/2023 1612   LYMPHSABS 1.6 01/31/2023 1612   MONOABS 0.8 01/31/2023 1612   EOSABS 0.2 01/31/2023 1612    BASOSABS 0.1 01/31/2023 1612     CMP     Component Value Date/Time   NA 134 (L) 01/31/2023 1612   K 3.9 01/31/2023 1612   CL 97 (L) 01/31/2023 1612   CO2 31 01/31/2023 1612   GLUCOSE 186 (H) 01/31/2023 1612   BUN 9 01/31/2023 1612   CREATININE 0.58 01/31/2023 1612   CALCIUM 9.9 01/31/2023 1612   PROT 7.4 01/31/2023 1612   ALBUMIN 4.4 01/31/2023 1612   AST 10 (L) 01/31/2023 1612   ALT 10 01/31/2023 1612   ALKPHOS 71 01/31/2023 1612   BILITOT 0.6 01/31/2023 1612   GFRNONAA >60 01/31/2023 1612   GFRAA >60 06/26/2019 0500     ASSESSMENT & PLAN: 67 yo female   Malignant neoplasm of upper-outer quadrant of right breast in female, estrogen receptor positive, pT1aN0M0, stage IA, G2, HER2 positive, ER positive, PR negative, and DCIS(+) -Screening detected, initial biopsy showed DCIS only.   -S/p right lumpectomy, path showed small tumor (0.5 cm) with high grade ductal carcinoma in situ (DCIS) also present. Margins are negative for invasive cancer, but DCIS margin was close. No lymphovascular or perineural invasion. Lymph node biopsy was not performed, a follow up R ax [place] 12/28/22 was negative -S/p port placement and SLNB (both negative) by Dr. Dwain Sarna on 01/12/23 and all 3 nodes were negative  -Ms. Smyre appears well. Exam is benign, we reviewed symptom management for cough.  -We again reviewed the treatment, plan, goal, potential SE's, and symptom management. She is ready to proceed -Labs adequate to begin C1 weekly abraxane and trastuzumab. T -F/up and C2 in 1 week   PLAN: -Reviewed the treatment, plan, goal, potential SE's, and symptom management. She is ready to proceed -Labs adequate to begin C1 weekly abraxane andtrastuzumab -F/up and C2 in 1 week      All questions were answered. The patient knows to call the clinic with any problems, questions or concerns. No barriers to learning were detected.   Santiago Glad, NP-C 02/02/2023

## 2023-02-04 ENCOUNTER — Telehealth: Payer: Self-pay

## 2023-02-04 NOTE — Telephone Encounter (Signed)
-----   Message from Nurse Currie Paris sent at 02/02/2023  3:27 PM EST ----- Regarding: First time Kanjinti/Abraxane Pt of Feng First time Kanjinti/Abraxane. Pt of Feng, tolerated infusion well. Please call to check in with pt when possible, thanks!

## 2023-02-04 NOTE — Telephone Encounter (Signed)
LM for patient that this nurse was calling to see how they were doing after their treatment. Please call back to Dr.  Feng's nurse at [phone] if they have any questions or concerns regarding the treatment. 

## 2023-02-08 NOTE — Assessment & Plan Note (Signed)
pT1aN0M0, stage IA, G2, HER2 positive, ER positive, PR negative, HER2+, and DCIS(+) It was discovered on screening mammogram, initial biopsy showed DCIS only.   -Status post a right lumpectomy.  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 Margins are negative for invasive cancer, but DCIS margin was close. No lymphovascular or perineural invasion. Lymph node biopsy was not performed, Dr. Dwain Sarna has ordered an ultrasound of the right axilla for further evaluation.  If ultrasound is negative, I think it is okay not to have sentinel lymph node biopsy given the small primary tumor. -Given the aggressive nature of her HER2 positive disease, and moderate risk of recurrence, I do recommend adjuvant chemotherapy with Taxol or Abraxane once weekly for 12 weeks. -she underwent port placement and right axillary SLN biopsy on 01/12/23 and all 3 nodes were negative

## 2023-02-09 ENCOUNTER — Inpatient Hospital Stay (HOSPITAL_BASED_OUTPATIENT_CLINIC_OR_DEPARTMENT_OTHER): Payer: Medicare Other

## 2023-02-09 ENCOUNTER — Inpatient Hospital Stay: Payer: Medicare Other | Admitting: Hematology

## 2023-02-09 ENCOUNTER — Inpatient Hospital Stay: Payer: Medicare Other

## 2023-02-09 ENCOUNTER — Encounter: Payer: Self-pay | Admitting: Hematology

## 2023-02-09 VITALS — BP 129/86 | HR 82 | Resp 18

## 2023-02-09 VITALS — BP 144/70 | HR 79 | Temp 98.0°F | Resp 18 | Wt 178.6 lb

## 2023-02-09 DIAGNOSIS — Z170 Estrogen receptor positive status [ER+]: Secondary | ICD-10-CM

## 2023-02-09 DIAGNOSIS — C50411 Malignant neoplasm of upper-outer quadrant of right female breast: Secondary | ICD-10-CM | POA: Diagnosis not present

## 2023-02-09 DIAGNOSIS — Z5112 Encounter for antineoplastic immunotherapy: Secondary | ICD-10-CM | POA: Diagnosis not present

## 2023-02-09 LAB — CBC WITH DIFFERENTIAL (CANCER CENTER ONLY)
Abs Immature Granulocytes: 0.29 10*3/uL — ABNORMAL HIGH (ref 0.00–0.07)
Basophils Absolute: 0.1 10*3/uL (ref 0.0–0.1)
Basophils Relative: 2 %
Eosinophils Absolute: 0.3 10*3/uL (ref 0.0–0.5)
Eosinophils Relative: 5 %
HCT: 39 % (ref 36.0–46.0)
Hemoglobin: 13.1 g/dL (ref 12.0–15.0)
Immature Granulocytes: 5 %
Lymphocytes Relative: 22 %
Lymphs Abs: 1.2 10*3/uL (ref 0.7–4.0)
MCH: 29.2 pg (ref 26.0–34.0)
MCHC: 33.6 g/dL (ref 30.0–36.0)
MCV: 87.1 fL (ref 80.0–100.0)
Monocytes Absolute: 0.4 10*3/uL (ref 0.1–1.0)
Monocytes Relative: 7 %
Neutro Abs: 3.3 10*3/uL (ref 1.7–7.7)
Neutrophils Relative %: 59 %
Platelet Count: 417 10*3/uL — ABNORMAL HIGH (ref 150–400)
RBC: 4.48 MIL/uL (ref 3.87–5.11)
RDW: 12.5 % (ref 11.5–15.5)
WBC Count: 5.6 10*3/uL (ref 4.0–10.5)
nRBC: 0 % (ref 0.0–0.2)

## 2023-02-09 LAB — CMP (CANCER CENTER ONLY)
ALT: 12 U/L (ref 0–44)
AST: 11 U/L — ABNORMAL LOW (ref 15–41)
Albumin: 4.2 g/dL (ref 3.5–5.0)
Alkaline Phosphatase: 71 U/L (ref 38–126)
Anion gap: 9 (ref 5–15)
BUN: 7 mg/dL — ABNORMAL LOW (ref 8–23)
CO2: 29 mmol/L (ref 22–32)
Calcium: 10 mg/dL (ref 8.9–10.3)
Chloride: 99 mmol/L (ref 98–111)
Creatinine: 0.54 mg/dL (ref 0.44–1.00)
GFR, Estimated: >60 mL/min (ref >60)
Glucose, Bld: 199 mg/dL — ABNORMAL HIGH (ref 70–99)
Potassium: 3.8 mmol/L (ref 3.5–5.1)
Sodium: 137 mmol/L (ref 135–145)
Total Bilirubin: 0.3 mg/dL (ref <1.2)
Total Protein: 7.1 g/dL (ref 6.5–8.1)

## 2023-02-09 MED ORDER — PACLITAXEL PROTEIN-BOUND CHEMO INJECTION 100 MG
80.0000 mg/m2 | Freq: Once | INTRAVENOUS | Status: AC
  Administered 2023-02-09: 150 mg via INTRAVENOUS
  Filled 2023-02-09: qty 30

## 2023-02-09 MED ORDER — ACETAMINOPHEN 325 MG PO TABS
650.0000 mg | ORAL_TABLET | Freq: Once | ORAL | Status: AC
  Administered 2023-02-09: 650 mg via ORAL
  Filled 2023-02-09: qty 2

## 2023-02-09 MED ORDER — HEPARIN SOD (PORK) LOCK FLUSH 100 UNIT/ML IV SOLN
500.0000 [IU] | Freq: Once | INTRAVENOUS | Status: AC | PRN
  Administered 2023-02-09: 500 [IU]

## 2023-02-09 MED ORDER — DIPHENHYDRAMINE HCL 50 MG/ML IJ SOLN
25.0000 mg | Freq: Once | INTRAMUSCULAR | Status: AC
  Administered 2023-02-09: 25 mg via INTRAVENOUS
  Filled 2023-02-09: qty 1

## 2023-02-09 MED ORDER — PROCHLORPERAZINE MALEATE 10 MG PO TABS
10.0000 mg | ORAL_TABLET | Freq: Once | ORAL | Status: AC
  Administered 2023-02-09: 10 mg via ORAL
  Filled 2023-02-09: qty 1

## 2023-02-09 MED ORDER — SODIUM CHLORIDE 0.9 % IV SOLN
Freq: Once | INTRAVENOUS | Status: AC
Start: 2023-02-09 — End: 2023-02-09

## 2023-02-09 MED ORDER — TRASTUZUMAB-ANNS CHEMO 150 MG IV SOLR
2.0000 mg/kg | Freq: Once | INTRAVENOUS | Status: AC
  Administered 2023-02-09: 150 mg via INTRAVENOUS
  Filled 2023-02-09: qty 7.14

## 2023-02-09 MED ORDER — SODIUM CHLORIDE 0.9% FLUSH
10.0000 mL | INTRAVENOUS | Status: DC | PRN
  Administered 2023-02-09: 10 mL

## 2023-02-09 NOTE — Patient Instructions (Signed)
[facility] - A DEPT OF [facility]  Discharge Instructions: Thank you for choosing [facility] to provide your oncology and hematology care.   If you have a lab appointment with the Cancer Center, please go directly to the Cancer Center and check in at the registration area.   Wear comfortable clothing and clothing appropriate for easy access to any Portacath or PICC line.   We strive to give you quality time with your provider. You may need to reschedule your appointment if you arrive late (15 or more minutes).  Arriving late affects you and other patients whose appointments are after yours.  Also, if you miss three or more appointments without notifying the office, you may be dismissed from the clinic at the provider's discretion.      For prescription refill requests, have your pharmacy contact our office and allow 72 hours for refills to be completed.    Today you received the following chemotherapy and/or immunotherapy agents Kanjinti / Abraxane      To help prevent nausea and vomiting after your treatment, we encourage you to take your nausea medication as directed.  BELOW ARE SYMPTOMS THAT SHOULD BE REPORTED IMMEDIATELY: *FEVER GREATER THAN 100.4 F (38 C) OR HIGHER *CHILLS OR SWEATING *NAUSEA AND VOMITING THAT IS NOT CONTROLLED WITH YOUR NAUSEA MEDICATION *UNUSUAL SHORTNESS OF BREATH *UNUSUAL BRUISING OR BLEEDING *URINARY PROBLEMS (pain or burning when urinating, or frequent urination) *BOWEL PROBLEMS (unusual diarrhea, constipation, pain near the anus) TENDERNESS IN MOUTH AND THROAT WITH OR WITHOUT PRESENCE OF ULCERS (sore throat, sores in mouth, or a toothache) UNUSUAL RASH, SWELLING OR PAIN  UNUSUAL VAGINAL DISCHARGE OR ITCHING   Items with * indicate a potential emergency and should be followed up as soon as possible or go to the Emergency Department if any problems should occur.  Please show the CHEMOTHERAPY ALERT CARD or  IMMUNOTHERAPY ALERT CARD at check-in to the Emergency Department and triage nurse.  Should you have questions after your visit or need to cancel or reschedule your appointment, please contact [facility] - A DEPT OF [facility]  Dept: [phone]  and follow the prompts.  Office hours are 8:00 a.m. to 4:30 p.m. Monday - Friday. Please note that voicemails left after 4:00 p.m. may not be returned until the following business day.  We are closed weekends and major holidays. You have access to a nurse at all times for urgent questions. Please call the main number to the clinic Dept: [phone] and follow the prompts.   For any non-urgent questions, you may also contact your provider using MyChart. We now offer e-Visits for anyone 50 and older to request care online for non-urgent symptoms. For details visit [email or web address].   Also download the MyChart app! Go to the app store, search "MyChart", open the app, select [facility], and log in with your MyChart username and password.

## 2023-02-09 NOTE — Progress Notes (Signed)
[facility]   [phone] [phone]   Clinic Follow up Note   Patient Care Team: Tisovec, Adelfa Koh, MD as PCP - General (Internal Medicine) Iran Ouch, MD as PCP - Cardiology (Cardiology) Malachy Mood, MD as Consulting Physician (Hematology) Emelia Loron, MD as Consulting Physician (General Surgery) Dorothy Puffer, MD as Consulting Physician (Radiation Oncology) Pershing Proud, RN as Oncology Nurse Navigator Donnelly Angelica, RN as Oncology Nurse Navigator  Date of Service:  02/09/2023  CHIEF COMPLAINT: f/u of breast cancer  CURRENT THERAPY:  Weekly Taxol and trastuzumab  Oncology History   Malignant neoplasm of upper-outer quadrant of right breast in female, estrogen receptor positive (HCC) pT1aN0M0, stage IA, G2, HER2 positive, ER positive, PR negative, HER2+, and DCIS(+) It was discovered on screening mammogram, initial biopsy showed DCIS only.   -Status post a right lumpectomy.  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 Margins are negative for invasive cancer, but DCIS margin was close. No lymphovascular or perineural invasion. Lymph node biopsy was not performed, Dr. Dwain Sarna has ordered an ultrasound of the right axilla for further evaluation.  If ultrasound is negative, I think it is okay not to have sentinel lymph node biopsy given the small primary tumor. -Given the aggressive nature of her HER2 positive disease, and moderate risk of recurrence, I do recommend adjuvant chemotherapy with Taxol or Abraxane once weekly for 12 weeks. -she underwent port placement and right axillary SLN biopsy on 01/12/23 and all 3 nodes were negative    Assessment and Plan    Breast Cancer 67 year old undergoing chemotherapy for breast cancer. Started treatment last week, experienced mild nausea likely due to sinus drainage. Transient fever of 100.2F  on the night of treatment, no subsequent fever or chills. Blood counts well-managed, slightly elevated platelet counts and blood glucose levels. Kidney and liver functions normal. Informed consent obtained, discussed risks of chemotherapy including nausea, fever, and potential neuropathy. Benefits include tumor reduction and potential remission. Alternative treatments discussed include treatment breaks if blood counts are too low. - Continue weekly chemotherapy with paclitaxel (Abraxane) for 12 weeks - Administer premedications including antiemetics, acetaminophen, and diphenhydramine - Monitor for neuropathy, especially after 2-3 months of treatment - Schedule physical therapy to prevent lymphedema and improve shoulder mobility - Proceed with treatment today and follow up every two weeks - Cancel next week's follow-up and see every other treatment  Sinus Drainage Reports clear sinus drainage contributing to mild nausea. No current fever or chills. - Manage symptoms as needed  Elevated Blood Glucose Blood glucose slightly elevated at 199 mg/dL post-breakfast, likely due to orange juice consumption. - Monitor blood glucose levels  PLAN -Lab reviewed, adequate for treatment, will proceed to cycle 2 chemotherapy and continue weekly -Follow-up in 2 weeks    SUMMARY OF ONCOLOGIC HISTORY: Oncology History  Malignant neoplasm of upper-outer quadrant of right breast in female, estrogen receptor positive (HCC)  10/27/2022 Cancer Staging   Staging form: Breast, AJCC 8th Edition - Clinical stage from 10/27/2022: Stage 0 (cTis (DCIS), cN0, cM0, G3, ER+, PR-, HER2: Not Assessed) - Signed by Malachy Mood, MD on 11/03/2022 Stage prefix: Initial diagnosis Histologic grading system: 3 grade system   11/01/2022 Initial Diagnosis   Ductal carcinoma in situ (DCIS) of right breast   12/07/2022 Cancer Staging   Staging form: Breast, AJCC 8th Edition - Pathologic stage from 12/07/2022: Stage IA (pT1a, pN0,  cM0,  G2, ER+, PR-, HER2+) - Signed by Malachy Mood, MD on 12/20/2022 Stage prefix: Initial diagnosis Histologic grading system: 3 grade system Residual tumor (R): R0 - None   02/02/2023 -  Chemotherapy   Patient is on Treatment Plan : BREAST Abraxane + Trastuzumab q7d / Trastuzumab q21d        Discussed the use of AI scribe software for clinical note transcription with the patient, who gave verbal consent to proceed.  History of Present Illness   The patient, a 67 year old female with a history of breast cancer, presents for a follow-up visit after starting chemotherapy last week. She reports feeling generally well, with a few days of nausea that she attributes more to sinus drainage from a concurrent cold than to the chemotherapy. She also experienced a mild fever of 100.24F on the night of her treatment, but it did not persist. She took anti-nausea medication on the night of her treatment and the following morning, but has not needed it since. She denies any issues with the premedication, including Benadryl. She also reports having started physical therapy after her breast surgery to prevent lymphedema and improve shoulder mobility.         All other systems were reviewed with the patient and are negative.  MEDICAL HISTORY:  Past Medical History:  Diagnosis Date   Anemia    Arthritis    Atypical chest pain    a. 05/2018 CTA chest: No PE; b. 05/2018 MV: No ischemia/scar. EF 65%.   Breast cancer [facility])    Coronary artery calcification seen on CT scan    a. 05/2018 CT Chest: Coronary and Ao atherosclerotic Calcifications.   Diabetes mellitus without complication (HCC)    Diastolic dysfunction    a. 05/2018 Echo: EF 60-65%, impaired relaxation. Nl RVSP.   Dysrhythmia    PVC's   Family history of adverse reaction to anesthesia    sister-malignant hyperthermia   FH of Malignant hyperthermia    a. sister had malginant hyperthermia   History of kidney stones    History of renal insufficiency     History of skin cancer    Hyperlipemia    Osteoporosis    Pulmonary nodules    a. 05/2018 CT Chest: Small pulm nodules measuring up to 6mm avg diameter. Rec non-contrast Chest CT in 6-12 mos.   Thyroid nodule     SURGICAL HISTORY: Past Surgical History:  Procedure Laterality Date   AXILLARY LYMPH NODE BIOPSY Right 01/12/2023   Procedure: RIGHT AXILLARY SENTINEL NODE BIOPSY;  Surgeon: Emelia Loron, MD;  Location: [facility] OR;  Service: General;  Laterality: Right;  LMA   BREAST BIOPSY Right 10/27/2022   MM RT BREAST BX W LOC DEV 1ST LESION IMAGE BX SPEC STEREO GUIDE 10/27/2022 [facility]   BREAST BIOPSY  12/06/2022   MM RT RADIOACTIVE SEED LOC MAMMO GUIDE 12/06/2022 [facility]MO[facility]   BREAST LUMPECTOMY WITH RADIOACTIVE SEED LOCALIZATION Right 12/07/2022   Procedure: RIGHT BREAST LUMPECTOMY WITH RADIOACTIVE SEED LOCALIZATION;  Surgeon: Emelia Loron, MD;  Location: [facility] OR;  Service: General;  Laterality: Right;   CESAREAN SECTION     CHOLECYSTECTOMY  2006   lap choli with umb hernia   COLONOSCOPY     HYSTEROSCOPY WITH D & C N/A 12/07/2022   Procedure: DILATATION AND CURETTAGE /HYSTEROSCOPY WITH POLYPECTOMY ENDOMETRIAL BIOPSY;  Surgeon: Waynard Reeds, MD;  Location: [facility] OR;  Service: Gynecology;  Laterality: N/A;   LIPOMA EXCISION Left 03/15/2014   Procedure: EXCISION LIPOMA LEFT LOWER QUARDRANT  ABDOMINAL WALL;  Surgeon: Violeta Gelinas, MD;  Location: [facility];  Service: General;  Laterality: Left;   PORTACATH PLACEMENT N/A 01/12/2023   Procedure: PORT PLACEMENT WITH ULTRASOUND GUIDANCE;  Surgeon: Emelia Loron, MD;  Location: [facility] OR;  Service: General;  Laterality: N/A;   THYROIDECTOMY N/A 06/25/2019   Procedure: TOTAL THYROIDECTOMY;  Surgeon: Darnell Level, MD;  Location: [facility] ORS;  Service: General;  Laterality: N/A;   TONSILLECTOMY     TRIGGER FINGER RELEASE  2012   lt small,middle,long   TRIGGER FINGER RELEASE Right 07/14/2017   Procedure: RELEASE TRIGGER  FINGER/A-1 PULLEY RIGHT THUMB;  Surgeon: Cindee Salt, MD;  Location: [facility];  Service: Orthopedics;  Laterality: Right;   UMBILICAL HERNIA REPAIR  2006   with lap choli    I have reviewed the social history and family history with the patient and they are unchanged from previous note.  ALLERGIES:  has No Known Allergies.  MEDICATIONS:  Current Outpatient Medications  Medication Sig Dispense Refill   aspirin EC 81 MG tablet Take 81 mg by mouth daily.     Insulin Glargine (BASAGLAR KWIKPEN) 100 UNIT/ML SOPN Inject 35 Units into the skin at bedtime.     insulin lispro (HUMALOG) 100 UNIT/ML injection Inject 10-15 Units into the skin 3 (three) times daily before meals. Sliding scale     levothyroxine (SYNTHROID) 112 MCG tablet Take 112 mcg by mouth daily before breakfast.     lidocaine-prilocaine (EMLA) cream Apply to affected area once (Patient not taking: Reported on 01/31/2023) 30 g 3   metFORMIN (GLUCOPHAGE) 1000 MG tablet Take 1,000 mg by mouth 2 (two) times daily with a meal.     metoprolol succinate (TOPROL-XL) 25 MG 24 hr tablet TAKE 1 TABLET (25 MG TOTAL) BY MOUTH DAILY. (Patient taking differently: Take 25 mg by mouth daily with supper.) 90 tablet 0   nitrofurantoin (MACRODANTIN) 100 MG capsule Take 100 mg by mouth daily.     Nutritional Supplements (JUICE PLUS FIBRE PO) Take 3 tablets by mouth daily. Juice Plus Vegetable     Nutritional Supplements (JUICE PLUS FIBRE PO) Take 3 tablets by mouth daily. Juice Plus Fruit     ondansetron (ZOFRAN) 8 MG tablet Take 1 tablet (8 mg total) by mouth every 8 (eight) hours as needed for nausea or vomiting. (Patient not taking: Reported on 01/31/2023) 30 tablet 1   prochlorperazine (COMPAZINE) 10 MG tablet Take 1 tablet (10 mg total) by mouth every 6 (six) hours as needed for nausea or vomiting. (Patient not taking: Reported on 01/31/2023) 30 tablet 1   ramipril (ALTACE) 5 MG capsule Take 5 mg by mouth daily with breakfast.      rosuvastatin (CRESTOR) 20 MG tablet Take 20 mg by mouth daily with supper.      triamcinolone cream (KENALOG) 0.1 % Apply 1 Application topically daily as needed (dermatitis). (Patient not taking: Reported on 01/31/2023)     No current facility-administered medications for this visit.   Facility-Administered Medications Ordered in Other Visits  Medication Dose Route Frequency Provider Last Rate Last Admin   sodium chloride flush (NS) 0.9 % injection 10 mL  10 mL Intracatheter PRN Malachy Mood, MD   10 mL at 02/09/23 1142    PHYSICAL EXAMINATION: ECOG PERFORMANCE STATUS: 1 - Symptomatic but completely ambulatory  Vitals:   02/09/23 0841  BP: (!) 144/70  Pulse: 79  Resp: 18  Temp: 98 F (36.7 C)  SpO2: 98%   Wt Readings from  Last 3 Encounters:  02/09/23 178 lb 9.6 oz (81 kg)  02/02/23 176 lb 4 oz (79.9 kg)  01/20/23 176 lb 4.8 oz (80 kg)     GENERAL:alert, no distress and comfortable SKIN: skin color, texture, turgor are normal, no rashes or significant lesions EYES: normal, Conjunctiva are pink and non-injected, sclera clear NECK: supple, thyroid normal size, non-tender, without nodularity LYMPH:  no palpable lymphadenopathy in the cervical, axillary  LUNGS: clear to auscultation and percussion with normal breathing effort HEART: regular rate & rhythm and no murmurs and no lower extremity edema ABDOMEN:abdomen soft, non-tender and normal bowel sounds Musculoskeletal:no cyanosis of digits and no clubbing  NEURO: alert & oriented x 3 with fluent speech, no focal motor/sensory deficits   LABORATORY DATA:  I have reviewed the data as listed    Latest Ref Rng & Units 02/09/2023    7:41 AM 01/31/2023    4:12 PM 01/07/2023    1:51 PM  CBC  WBC 4.0 - 10.5 K/uL 5.6  9.2  8.7   Hemoglobin 12.0 - 15.0 g/dL 16.1  09.6  04.5   Hematocrit 36.0 - 46.0 % 39.0  40.1  43.0   Platelets 150 - 400 K/uL 417  317  338         Latest Ref Rng & Units 02/09/2023    7:41 AM 01/31/2023    4:12  PM 01/07/2023    1:51 PM  CMP  Glucose 70 - 99 mg/dL 409  811  914   BUN 8 - 23 mg/dL 7  9  6    Creatinine 0.44 - 1.00 mg/dL 7.82  9.56  2.13   Sodium 135 - 145 mmol/L 137  134  137   Potassium 3.5 - 5.1 mmol/L 3.8  3.9  4.4   Chloride 98 - 111 mmol/L 99  97  100   CO2 22 - 32 mmol/L 29  31  25    Calcium 8.9 - 10.3 mg/dL 08.6  9.9  9.6   Total Protein 6.5 - 8.1 g/dL 7.1  7.4    Total Bilirubin <1.2 mg/dL 0.3  0.6    Alkaline Phos 38 - 126 U/L 71  71    AST 15 - 41 U/L 11  10    ALT 0 - 44 U/L 12  10        RADIOGRAPHIC STUDIES: I have personally reviewed the radiological images as listed and agreed with the findings in the report. No results found.    Orders Placed This Encounter  Procedures   CBC with Differential (Cancer Center Only)    Standing Status:   Future    Standing Expiration Date:   03/30/2024   CMP (Cancer Center only)    Standing Status:   Future    Standing Expiration Date:   03/30/2024   CBC with Differential (Cancer Center Only)    Standing Status:   Future    Standing Expiration Date:   04/06/2024   CMP (Cancer Center only)    Standing Status:   Future    Standing Expiration Date:   04/06/2024   CBC with Differential (Cancer Center Only)    Standing Status:   Future    Standing Expiration Date:   04/13/2024   CMP (Cancer Center only)    Standing Status:   Future    Standing Expiration Date:   04/13/2024   CBC with Differential (Cancer Center Only)    Standing Status:   Future    Standing Expiration  Date:   04/20/2024   CMP (Cancer Center only)    Standing Status:   Future    Standing Expiration Date:   04/20/2024   All questions were answered. The patient knows to call the clinic with any problems, questions or concerns. No barriers to learning was detected. The total time spent in the appointment was 25 minutes.     Malachy Mood, MD 02/09/2023

## 2023-02-10 ENCOUNTER — Ambulatory Visit: Payer: Medicare Other | Admitting: Rehabilitation

## 2023-02-10 ENCOUNTER — Encounter: Payer: Self-pay | Admitting: Physical Therapy

## 2023-02-10 ENCOUNTER — Ambulatory Visit: Payer: Medicare Other | Attending: General Surgery | Admitting: Physical Therapy

## 2023-02-10 DIAGNOSIS — Z483 Aftercare following surgery for neoplasm: Secondary | ICD-10-CM | POA: Diagnosis present

## 2023-02-10 DIAGNOSIS — Z170 Estrogen receptor positive status [ER+]: Secondary | ICD-10-CM | POA: Insufficient documentation

## 2023-02-10 DIAGNOSIS — C50411 Malignant neoplasm of upper-outer quadrant of right female breast: Secondary | ICD-10-CM | POA: Insufficient documentation

## 2023-02-10 DIAGNOSIS — Z9189 Other specified personal risk factors, not elsewhere classified: Secondary | ICD-10-CM | POA: Diagnosis present

## 2023-02-10 NOTE — Patient Instructions (Signed)
[facility]  [address], [address]  [place] [address]  [phone]  After Breast Cancer Class It is recommended you attend the ABC class to be educated on lymphedema risk reduction. This class is free of charge and lasts for 1 hour. It is a 1-time class. You will need to download the TEAMS app either on your phone or computer. We will send you a link the night before or the morning of the class. You should be able to click on that link to join the class. This is not a confidential class. You don't have to turn your camera on, but other participants may be able to see your email address. I'VE GIVEN YOU THE CLASS HANDOUT. CALL ME WITH ANY QUESTIONS AFTER YOU READ IT.  Scar massage You can begin gentle scar massage to you incision sites. Gently place one hand on the incision and move the skin (without sliding on the skin) in various directions. Do this for a few minutes and then you can gently massage either coconut oil or vitamin E cream into the scars.  Compression garment You should continue wearing your compression bra until you feel like you no longer have swelling.  Home exercise Program Continue doing the exercises you were given until you feel like you can do them without feeling any tightness at the end.   Walking Program Studies show that 30 minutes of walking per day (fast enough to elevate your heart rate) can significantly reduce the risk of a cancer recurrence. If you can't walk due to other medical reasons, we encourage you to find another activity you could do (like a stationary bike or water exercise).  Posture After breast cancer surgery, people frequently sit with rounded shoulders posture because it puts their incisions on slack and feels better. If you sit like this and scar tissue forms in that position, you can become very tight and have pain sitting or standing with good posture. Try to be aware of your posture and sit and stand up tall to heal  properly.  Follow up PT: It is recommended you return every 3 months for the first 3 years following surgery to be assessed on the SOZO machine for an L-Dex score. This helps prevent clinically significant lymphedema in 95% of patients. These follow up screens are 10 minute appointments that you are not billed for. YOU CAN GO TO [facility] FOR THIS. CALL THEM TO GET SCHEDULED FOR A SOZO SCREEN FOR AROUND 04/14/2023. THEIR NUMBER IS [phone].

## 2023-02-10 NOTE — Therapy (Signed)
OUTPATIENT PHYSICAL THERAPY BREAST CANCER POST OP FOLLOW UP   Patient Name: [name] MRN: [id number] DOB:10-02-1955, 67 y.o., female Today's Date: 02/10/2023  END OF SESSION:  PT End of Session - 02/10/23 0852     Visit Number 2    Number of Visits 2    PT Start Time 0846    PT Stop Time 0930    PT Time Calculation (min) 44 min    Activity Tolerance Patient tolerated treatment well    Behavior During Therapy [facility] for tasks assessed/performed             Past Medical History:  Diagnosis Date   Anemia    Arthritis    Atypical chest pain    a. 05/2018 CTA chest: No PE; b. 05/2018 MV: No ischemia/scar. EF 65%.   Breast cancer [facility])    Coronary artery calcification seen on CT scan    a. 05/2018 CT Chest: Coronary and Ao atherosclerotic Calcifications.   Diabetes mellitus without complication (HCC)    Diastolic dysfunction    a. 05/2018 Echo: EF 60-65%, impaired relaxation. Nl RVSP.   Dysrhythmia    PVC's   Family history of adverse reaction to anesthesia    sister-malignant hyperthermia   FH of Malignant hyperthermia    a. sister had malginant hyperthermia   History of kidney stones    History of renal insufficiency    History of skin cancer    Hyperlipemia    Osteoporosis    Pulmonary nodules    a. 05/2018 CT Chest: Small pulm nodules measuring up to 6mm avg diameter. Rec non-contrast Chest CT in 6-12 mos.   Thyroid nodule    Past Surgical History:  Procedure Laterality Date   AXILLARY LYMPH NODE BIOPSY Right 01/12/2023   Procedure: RIGHT AXILLARY SENTINEL NODE BIOPSY;  Surgeon: Emelia Loron, MD;  Location: [facility] OR;  Service: General;  Laterality: Right;  LMA   BREAST BIOPSY Right 10/27/2022   MM RT BREAST BX W LOC DEV 1ST LESION IMAGE BX SPEC STEREO GUIDE 10/27/2022 [facility]   BREAST BIOPSY  12/06/2022   MM RT RADIOACTIVE SEED LOC MAMMO GUIDE 12/06/2022 [facility]   BREAST LUMPECTOMY WITH RADIOACTIVE SEED LOCALIZATION Right 12/07/2022    Procedure: RIGHT BREAST LUMPECTOMY WITH RADIOACTIVE SEED LOCALIZATION;  Surgeon: Emelia Loron, MD;  Location: [facility] OR;  Service: General;  Laterality: Right;   CESAREAN SECTION     CHOLECYSTECTOMY  2006   lap choli with umb hernia   COLONOSCOPY     HYSTEROSCOPY WITH D & C N/A 12/07/2022   Procedure: DILATATION AND CURETTAGE /HYSTEROSCOPY WITH POLYPECTOMY ENDOMETRIAL BIOPSY;  Surgeon: Waynard Reeds, MD;  Location: [facility] OR;  Service: Gynecology;  Laterality: N/A;   LIPOMA EXCISION Left 03/15/2014   Procedure: EXCISION LIPOMA LEFT LOWER QUARDRANT ABDOMINAL WALL;  Surgeon: Violeta Gelinas, MD;  Location: [facility];  Service: General;  Laterality: Left;   PORTACATH PLACEMENT N/A 01/12/2023   Procedure: PORT PLACEMENT WITH ULTRASOUND GUIDANCE;  Surgeon: Emelia Loron, MD;  Location[facility] OR;  Service: General;  Laterality: N/A;   THYROIDECTOMY N/A 06/25/2019   Procedure: TOTAL THYROIDECTOMY;  Surgeon: Darnell Level, MD;  Location: [facility];  Service: General;  Laterality: N/A;   TONSILLECTOMY     TRIGGER FINGER RELEASE  2012   lt small,middle,long   TRIGGER FINGER RELEASE Right 07/14/2017   Procedure: RELEASE TRIGGER FINGER/A-1 PULLEY RIGHT THUMB;  Surgeon: Cindee Salt, MD;  Location:  SURGERY CENTER;  Service: Orthopedics;  Laterality: Right;   UMBILICAL HERNIA REPAIR  2006   with lap choli   Patient Active Problem List   Diagnosis Date Noted   CHEK2 gene mutation positive 11/12/2022   Genetic testing 11/12/2022   Malignant neoplasm of upper-outer quadrant of right breast in female, estrogen receptor positive (HCC) 11/01/2022   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 Emelia Loron  REFERRING DIAG: Right breast cancer  THERAPY DIAG:  Malignant neoplasm of upper-outer quadrant of right breast in  female, estrogen receptor positive [facility])  Aftercare following surgery for neoplasm  At risk for lymphedema  Rationale for Evaluation and Treatment: Rehabilitation  ONSET DATE: 12/07/2022  SUBJECTIVE:                                                                                                                                                                                           SUBJECTIVE STATEMENT: Patient reports she underwent a right lumpectomy on 12/07/2022 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 Due to concerns about lymph node involvement, she had a right sentinel node biopsy on 01/12/2023. She had 3 negative nodes removed. She will undergo weekly Taxol x12 and Herceptin every 3 weeks for 1 year. She will also undergo radiation and anti-estrogen therapy. She had her 2nd chemo treatment 02/09/2023.  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  Dr. Dwain Sarna then did an US of the axilla and it was decided to go back to do a SLNB.  Will have chemo with taxol or abraxane and then radiation. Will have Rt SLNB and port placement 01/12/23.     PATIENT GOALS:  Reassess how my recovery is going related to arm function, pain, and swelling.  PAIN:  Are you having pain? Yes: NPRS scale: 5/10 Pain location: right posterior upper arm Pain description: tingling Aggravating factors: nothing Relieving factors: nothing  PRECAUTIONS: Recent Surgery, right UE Lymphedema risk, Other: currently undergoing chemotherapy  RED FLAGS: None   ACTIVITY LEVEL / LEISURE: She is doing her HEP but not exercising   OBJECTIVE:   PATIENT SURVEYS:  QUICK DASH:  Quick Dash - 02/10/23 0001     Open a tight or new jar Mild difficulty    Do heavy household chores (wash walls, wash floors) No difficulty    Carry a  shopping bag or  briefcase No difficulty    Wash your back No difficulty    Use a knife to cut food No difficulty    Recreational activities in which you take some force or impact through your arm, shoulder, or hand (golf, hammering, tennis) No difficulty    During the past week, to what extent has your arm, shoulder or hand problem interfered with your normal social activities with family, friends, neighbors, or groups? Not at all    During the past week, to what extent has your arm, shoulder or hand problem limited your work or other regular daily activities Not at all    Arm, shoulder, or hand pain. None    Tingling (pins and needles) in your arm, shoulder, or hand None    Difficulty Sleeping No difficulty    DASH Score 2.27 %              OBSERVATIONS: Right axillary and breast incisions are both healing very well. Mild edema present superior to axillary incision with scar tissue puckering. Scar tissue tightness right lateral breast.  POSTURE:  Forward head and rounded shoulders  LYMPHEDEMA ASSESSMENT:   UPPER EXTREMITY AROM/PROM:   A/PROM RIGHT   eval   RIGHT 02/10/2023  Shoulder extension 60 53  Shoulder flexion 153 149  Shoulder abduction 162 163  Shoulder internal rotation   47  Shoulder external rotation 80 77                          (Blank rows = not tested)               CERVICAL AROM: All within normal limits:    UPPER EXTREMITY STRENGTH: WNL   LYMPHEDEMA ASSESSMENTS (in cm):    LANDMARK RIGHT   eval RIGHT 02/10/2023  10 cm proximal to olecranon process 29 27.5  Olecranon process 26 24.9  10 cm proximal to ulnar styloid process 21.3 22.5  Just proximal to ulnar styloid process 16.2 15.3  Across hand at thumb web space 19.8 18.9  At base of 2nd digit 6.5 6.9  (Blank rows = not tested)   LANDMARK LEFT   eval LEFT 02/10/2023  10 cm proximal to olecranon process 28.1 27  Olecranon process 25.7 24.6  10 cm proximal to ulnar styloid process 20.1 20  Just proximal  to ulnar styloid process 16.2 15.3  Across hand at thumb web space 19.3 18.2  At base of 2nd digit 6.3 6  (Blank rows = not tested)    Surgery type/Date: Right lumpectomy 12/07/2022 and sentinel node biopsy 10/16/204 Number of lymph nodes removed: 3 Current/past treatment (chemo, radiation, hormone therapy): currently undergoing chemotherapy Other symptoms:  Heaviness/tightness No Pain Yes Pitting edema No Infections No Decreased scar mobility Yes Stemmer sign No  PATIENT EDUCATION:  Education details: HEP Person educated: Patient Education method: Explanation Education comprehension: verbalized understanding  HOME EXERCISE PROGRAM: Reviewed previously given post op HEP.   ASSESSMENT:  CLINICAL IMPRESSION: Patient has regained shoulder ROM and function s/p sentinel node biopsy on 01/12/2023. Incisions are healing well. She is tolerating chemotherapy well so far. She was educated on the importance of a walking program and to continue her HEP. She has no need for PT at this time but will benefit from SOZO screens x2 years every 3 months.   Pt will benefit from skilled therapeutic intervention to improve on the following deficits: Decreased knowledge of precautions, impaired UE functional use,  pain, decreased ROM, postural dysfunction.   PT treatment/interventions: ADL/Self care home management, [phone]- PT Re-evaluation, 97110-Therapeutic exercises, 97530- Therapeutic activity, and 64332- Self Care   GOALS: Goals reviewed with patient? Yes  LONG TERM GOALS:  (STG=LTG)  GOALS Name Target Date  Goal status  1 Pt will demonstrate she has regained full shoulder ROM and function post operatively compared to baselines.  Baseline: 02/22/2023 MET     PLAN:  PT FREQUENCY/DURATION: N/A   PLAN FOR NEXT SESSION: D/C - continue with SOZO screens at [facility] as it is closer to home   [facility]  [address], [address]  [place] [address]  [phone]  After Breast Cancer Class It is recommended you attend the ABC class to be educated on lymphedema risk reduction. This class is free of charge and lasts for 1 hour. It is a 1-time class. You will need to download the TEAMS app either on your phone or computer. We will send you a link the night before or the morning of the class. You should be able to click on that link to join the class. This is not a confidential class. You don't have to turn your camera on, but other participants may be able to see your email address. I'VE GIVEN YOU THE CLASS HANDOUT. CALL ME WITH ANY QUESTIONS AFTER YOU READ IT.  Scar massage You can begin gentle scar massage to you incision sites. Gently place one hand on the incision and move the skin (without sliding on the skin) in various directions. Do this for a few minutes and then you can gently massage either coconut oil or vitamin E cream into the scars.  Compression garment You should continue wearing your compression bra until you feel like you no longer have swelling.  Home exercise Program Continue doing the exercises you were given until you feel like you can do them without feeling any tightness at the end.   Walking Program Studies show that 30 minutes of walking per day (fast enough to elevate your heart rate) can significantly reduce the risk of a cancer recurrence. If you can't walk due to other medical reasons, we encourage you to find another activity you could do (like a stationary bike or water exercise).  Posture After breast cancer surgery, people frequently sit with rounded shoulders posture because it puts their incisions on slack and feels better. If you sit like this and scar tissue forms in that position, you can become very tight and have pain sitting or standing with good posture. Try to be aware of your posture and sit and stand up tall to heal properly.  Follow up PT: It is recommended you return every 3 months for the first 3  years following surgery to be assessed on the SOZO machine for an L-Dex score. This helps prevent clinically significant lymphedema in 95% of patients. These follow up screens are 10 minute appointments that you are not billed for. YOU CAN GO TO [facility] FOR THIS. CALL THEM TO GET SCHEDULED FOR A SOZO SCREEN FOR AROUND 04/14/2023. THEIR NUMBER IS [phone].   PHYSICAL THERAPY DISCHARGE SUMMARY  Visits from Start of Care: 2  Current functional level related to goals / functional outcomes: GOALS MET; SEE ABOVE FOR OBJECTIVE FINDINGS.   Remaining deficits: None   Education / Equipment: HEP   Patient agrees to discharge. Patient goals were met. Patient is being discharged due to meeting the stated rehab goals.  Bethann Punches, [place] 02/10/23  9:52 AM

## 2023-02-11 ENCOUNTER — Other Ambulatory Visit: Payer: Self-pay

## 2023-02-14 ENCOUNTER — Telehealth: Payer: Self-pay

## 2023-02-14 NOTE — Telephone Encounter (Signed)
Pt called in stating that she needed cancel appt. On 11/21 with Dr. Mosetta Putt. Pt. Stated that Md needs to see her other week. Sent high priority to scheduling to cancel est. Appt. On 11/21.

## 2023-02-17 ENCOUNTER — Inpatient Hospital Stay: Payer: Medicare Other

## 2023-02-17 ENCOUNTER — Ambulatory Visit: Payer: Medicare Other | Admitting: Hematology

## 2023-02-17 VITALS — BP 173/90 | HR 85 | Temp 98.4°F | Resp 18 | Ht 66.0 in | Wt 178.0 lb

## 2023-02-17 DIAGNOSIS — Z170 Estrogen receptor positive status [ER+]: Secondary | ICD-10-CM

## 2023-02-17 DIAGNOSIS — Z5112 Encounter for antineoplastic immunotherapy: Secondary | ICD-10-CM | POA: Diagnosis not present

## 2023-02-17 LAB — CMP (CANCER CENTER ONLY)
ALT: 13 U/L (ref 0–44)
AST: 13 U/L — ABNORMAL LOW (ref 15–41)
Albumin: 4.2 g/dL (ref 3.5–5.0)
Alkaline Phosphatase: 59 U/L (ref 38–126)
Anion gap: 7 (ref 5–15)
BUN: 8 mg/dL (ref 8–23)
CO2: 29 mmol/L (ref 22–32)
Calcium: 9.5 mg/dL (ref 8.9–10.3)
Chloride: 103 mmol/L (ref 98–111)
Creatinine: 0.55 mg/dL (ref 0.44–1.00)
GFR, Estimated: >60 mL/min (ref >60)
Glucose, Bld: 46 mg/dL — ABNORMAL LOW (ref 70–99)
Potassium: 3.5 mmol/L (ref 3.5–5.1)
Sodium: 139 mmol/L (ref 135–145)
Total Bilirubin: 0.4 mg/dL (ref <1.2)
Total Protein: 6.9 g/dL (ref 6.5–8.1)

## 2023-02-17 LAB — CBC WITH DIFFERENTIAL (CANCER CENTER ONLY)
Abs Immature Granulocytes: 0.11 10*3/uL — ABNORMAL HIGH (ref 0.00–0.07)
Basophils Absolute: 0.1 10*3/uL (ref 0.0–0.1)
Basophils Relative: 2 %
Eosinophils Absolute: 0.2 10*3/uL (ref 0.0–0.5)
Eosinophils Relative: 4 %
HCT: 36.7 % (ref 36.0–46.0)
Hemoglobin: 12.4 g/dL (ref 12.0–15.0)
Immature Granulocytes: 2 %
Lymphocytes Relative: 27 %
Lymphs Abs: 1.5 10*3/uL (ref 0.7–4.0)
MCH: 29.5 pg (ref 26.0–34.0)
MCHC: 33.8 g/dL (ref 30.0–36.0)
MCV: 87.4 fL (ref 80.0–100.0)
Monocytes Absolute: 0.4 10*3/uL (ref 0.1–1.0)
Monocytes Relative: 7 %
Neutro Abs: 3.3 10*3/uL (ref 1.7–7.7)
Neutrophils Relative %: 58 %
Platelet Count: 399 10*3/uL (ref 150–400)
RBC: 4.2 MIL/uL (ref 3.87–5.11)
RDW: 13.2 % (ref 11.5–15.5)
WBC Count: 5.6 10*3/uL (ref 4.0–10.5)
nRBC: 0 % (ref 0.0–0.2)

## 2023-02-17 MED ORDER — DIPHENHYDRAMINE HCL 50 MG/ML IJ SOLN
25.0000 mg | Freq: Once | INTRAMUSCULAR | Status: AC
  Administered 2023-02-17: 25 mg via INTRAVENOUS
  Filled 2023-02-17: qty 1

## 2023-02-17 MED ORDER — SODIUM CHLORIDE 0.9% FLUSH
10.0000 mL | INTRAVENOUS | Status: DC | PRN
  Administered 2023-02-17: 10 mL

## 2023-02-17 MED ORDER — ACETAMINOPHEN 325 MG PO TABS
650.0000 mg | ORAL_TABLET | Freq: Once | ORAL | Status: AC
  Administered 2023-02-17: 650 mg via ORAL
  Filled 2023-02-17: qty 2

## 2023-02-17 MED ORDER — PROCHLORPERAZINE MALEATE 10 MG PO TABS
10.0000 mg | ORAL_TABLET | Freq: Once | ORAL | Status: AC
  Administered 2023-02-17: 10 mg via ORAL
  Filled 2023-02-17: qty 1

## 2023-02-17 MED ORDER — SODIUM CHLORIDE 0.9 % IV SOLN: Freq: Once | INTRAVENOUS | Status: AC

## 2023-02-17 MED ORDER — HEPARIN SOD (PORK) LOCK FLUSH 100 UNIT/ML IV SOLN
500.0000 [IU] | Freq: Once | INTRAVENOUS | Status: AC | PRN
  Administered 2023-02-17: 500 [IU]

## 2023-02-17 MED ORDER — TRASTUZUMAB-ANNS CHEMO 150 MG IV SOLR
2.0000 mg/kg | Freq: Once | INTRAVENOUS | Status: AC
  Administered 2023-02-17: 150 mg via INTRAVENOUS
  Filled 2023-02-17: qty 7.14

## 2023-02-17 MED ORDER — PACLITAXEL PROTEIN-BOUND CHEMO INJECTION 100 MG
80.0000 mg/m2 | Freq: Once | INTRAVENOUS | Status: AC
  Administered 2023-02-17: 150 mg via INTRAVENOUS
  Filled 2023-02-17: qty 30

## 2023-02-17 NOTE — Patient Instructions (Signed)
 [facility] - A DEPT OF [facility]  Discharge Instructions: Thank you for choosing [facility] to provide your oncology and hematology care.   If you have a lab appointment with the Cancer Center, please go directly to the Cancer Center and check in at the registration area.   Wear comfortable clothing and clothing appropriate for easy access to any Portacath or PICC line.   We strive to give you quality time with your provider. You may need to reschedule your appointment if you arrive late (15 or more minutes).  Arriving late affects you and other patients whose appointments are after yours.  Also, if you miss three or more appointments without notifying the office, you may be dismissed from the clinic at the provider's discretion.      For prescription refill requests, have your pharmacy contact our office and allow 72 hours for refills to be completed.    Today you received the following chemotherapy and/or immunotherapy agents Kanjinti / Abraxane      To help prevent nausea and vomiting after your treatment, we encourage you to take your nausea medication as directed.  BELOW ARE SYMPTOMS THAT SHOULD BE REPORTED IMMEDIATELY: *FEVER GREATER THAN 100.4 F (38 C) OR HIGHER *CHILLS OR SWEATING *NAUSEA AND VOMITING THAT IS NOT CONTROLLED WITH YOUR NAUSEA MEDICATION *UNUSUAL SHORTNESS OF BREATH *UNUSUAL BRUISING OR BLEEDING *URINARY PROBLEMS (pain or burning when urinating, or frequent urination) *BOWEL PROBLEMS (unusual diarrhea, constipation, pain near the anus) TENDERNESS IN MOUTH AND THROAT WITH OR WITHOUT PRESENCE OF ULCERS (sore throat, sores in mouth, or a toothache) UNUSUAL RASH, SWELLING OR PAIN  UNUSUAL VAGINAL DISCHARGE OR ITCHING   Items with * indicate a potential emergency and should be followed up as soon as possible or go to the Emergency Department if any problems should occur.  Please show the CHEMOTHERAPY ALERT CARD or  IMMUNOTHERAPY ALERT CARD at check-in to the Emergency Department and triage nurse.  Should you have questions after your visit or need to cancel or reschedule your appointment, please contact [facility] - A DEPT OF [facility]  Dept: [phone]  and follow the prompts.  Office hours are 8:00 a.m. to 4:30 p.m. Monday - Friday. Please note that voicemails left after 4:00 p.m. may not be returned until the following business day.  We are closed weekends and major holidays. You have access to a nurse at all times for urgent questions. Please call the main number to the clinic Dept: [phone] and follow the prompts.   For any non-urgent questions, you may also contact your provider using MyChart. We now offer e-Visits for anyone 50 and older to request care online for non-urgent symptoms. For details visit [email or web address].   Also download the MyChart app! Go to the app store, search "MyChart", open the app, select [place], and log in with your MyChart username and password.

## 2023-02-18 ENCOUNTER — Encounter: Payer: Self-pay | Admitting: Genetic Counselor

## 2023-02-22 NOTE — Assessment & Plan Note (Signed)
pT1aN0M0, stage IA, G2, HER2 positive, ER positive, PR negative, HER2+, and DCIS(+) It was discovered on screening mammogram, initial biopsy showed DCIS only.   -Status post a right lumpectomy.  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 Margins are negative for invasive cancer, but DCIS margin was close. No lymphovascular or perineural invasion. Lymph node biopsy was not performed, Dr. Dwain Sarna has ordered an ultrasound of the right axilla for further evaluation.  If ultrasound is negative, I think it is okay not to have sentinel lymph node biopsy given the small primary tumor. -Given the aggressive nature of her HER2 positive disease, and moderate risk of recurrence, I do recommend adjuvant chemotherapy with Taxol or Abraxane once weekly for 12 weeks. -she underwent port placement and right axillary SLN biopsy on 01/12/23 and all 3 nodes were negative

## 2023-02-22 NOTE — Progress Notes (Unsigned)
Patient Care Team: Tisovec, Adelfa Koh, MD as PCP - General (Internal Medicine) Iran Ouch, MD as PCP - Cardiology (Cardiology) Malachy Mood, MD as Consulting Physician (Hematology) Emelia Loron, MD as Consulting Physician (General Surgery) Dorothy Puffer, MD as Consulting Physician (Radiation Oncology) Pershing Proud, RN as Oncology Nurse Navigator Donnelly Angelica, RN as Oncology Nurse Navigator  Clinic Day:  02/23/2023  Referring physician: Malachy Mood, MD  ASSESSMENT & PLAN:   Assessment & Plan: Malignant neoplasm of upper-outer quadrant of right breast in female, estrogen receptor positive (HCC) pT1aN0M0, stage IA, G2, HER2 positive, ER positive, PR negative, HER2+, and DCIS(+) It was discovered on screening mammogram, initial biopsy showed DCIS only.   -Status post a right lumpectomy.  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 Margins are negative for invasive cancer, but DCIS margin was close. No lymphovascular or perineural invasion. Lymph node biopsy was not performed, Dr. Dwain Sarna has ordered an ultrasound of the right axilla for further evaluation.  If ultrasound is negative, I think it is okay not to have sentinel lymph node biopsy given the small primary tumor. -Given the aggressive nature of her HER2 positive disease, and moderate risk of recurrence, I do recommend adjuvant chemotherapy with Taxol or Abraxane once weekly for 12 weeks. -she underwent port placement and right axillary SLN biopsy on 01/12/23 and all 3 nodes were negative    Plan: Labs reviewed  -CBC showing WBC 5.0; Hgb 12.1; Hct 35.9; Plt 4.6; Anc 3.2 -CMP - K 4.1; glucose 323; BUN 8; Creatinine 0.57; eGFR > 60; Ca 9.7; LFTs normal.   Patient positive for UTI per primary care.  Reviewed urine culture results which are positive for Klebsiella.  Bacteria resistant to her daily dose of Macrobid.  Start Cipro 500  mg twice daily for 5 days.  Patient understands to hold Macrobid while taking Cipro.  She will follow-up with primary care/urology for further evaluation of chronic UTI. Labs/flush with treatment weekly. Labs/flush, treatment, and follow-up every other week.  The patient understands the plans discussed today and is in agreement with them.  She knows to contact our office if she develops concerns prior to her next appointment.  I provided 30 minutes of face-to-face time during this encounter and > 50% was spent counseling as documented under my assessment and plan.    Carlean Jews, NP  [facility] - A DEPT OF [facility] [address] [place] [address] Dept: [phone] Dept Fax: [phone]   No orders of the defined types were placed in this encounter.     CHIEF COMPLAINT:  CC: f/u right breast cancer  Current Treatment:  weekly taxol and trastuzumab every 21 days started 01/12/2023.  INTERVAL HISTORY:  [name] is here today for repeat clinical assessment. She last saw Dr. Mosetta Putt 02/10/2023. Today starts cycle 1 day 22.  She is tolerating chemotherapy very well.  Reports mild nausea for few days.  Has taken 3-4 nausea pills.  Appetite is good.  She denies constipation or diarrhea out of the ordinary.  She does have a history of IBS.  Recently gave urine stable to primary care.  Urinalysis was positive for white blood cells and nitrates.  Culture results positive for Klebsiella, resistant to Macrobid.  Cipro 500 mg twice daily for 5 days ordered.  She follow-up with primary care and urology  for management of chronic UTI.  Her weight has been stable.  I have reviewed the past medical history, past surgical history, social history and family history with the patient and they are unchanged from previous note.  ALLERGIES:  has No Known Allergies.  MEDICATIONS:  Current Outpatient Medications  Medication Sig  Dispense Refill   aspirin EC 81 MG tablet Take 81 mg by mouth daily.     Insulin Glargine (BASAGLAR KWIKPEN) 100 UNIT/ML SOPN Inject 35 Units into the skin at bedtime.     insulin lispro (HUMALOG) 100 UNIT/ML injection Inject 10-15 Units into the skin 3 (three) times daily before meals. Sliding scale     levothyroxine (SYNTHROID) 112 MCG tablet Take 112 mcg by mouth daily before breakfast.     lidocaine-prilocaine (EMLA) cream Apply to affected area once 30 g 3   metFORMIN (GLUCOPHAGE) 1000 MG tablet Take 1,000 mg by mouth 2 (two) times daily with a meal.     metoprolol succinate (TOPROL-XL) 25 MG 24 hr tablet TAKE 1 TABLET (25 MG TOTAL) BY MOUTH DAILY. (Patient taking differently: Take 25 mg by mouth daily with supper.) 90 tablet 0   nitrofurantoin (MACRODANTIN) 100 MG capsule Take 100 mg by mouth daily.     Nutritional Supplements (JUICE PLUS FIBRE PO) Take 3 tablets by mouth daily. Juice Plus Vegetable     Nutritional Supplements (JUICE PLUS FIBRE PO) Take 3 tablets by mouth daily. Juice Plus Fruit     ondansetron (ZOFRAN) 8 MG tablet Take 1 tablet (8 mg total) by mouth every 8 (eight) hours as needed for nausea or vomiting. 30 tablet 1   prochlorperazine (COMPAZINE) 10 MG tablet Take 1 tablet (10 mg total) by mouth every 6 (six) hours as needed for nausea or vomiting. 30 tablet 1   ramipril (ALTACE) 5 MG capsule Take 5 mg by mouth daily with breakfast.     rosuvastatin (CRESTOR) 20 MG tablet Take 20 mg by mouth daily with supper.      triamcinolone cream (KENALOG) 0.1 % Apply 1 Application topically daily as needed (dermatitis).     No current facility-administered medications for this visit.    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  No other deleterious variants in [facility] Common Hereditary Cancers +RNA Panel. Report date is 11/09/2022.    The [employer] Common Hereditary Cancers + RNA Panel includes sequencing, deletion/duplication, and RNA analysis of the following 48 genes: APC, ATM, AXIN2, BAP1, BARD1, BMPR1A, BRCA1, BRCA2, BRIP1, CDH1, CDK4*, CDKN2A*, CHEK2, CTNNA1, DICER1, EPCAM* (del/dup only), FH, GREM1* (promoter dup analysis only), HOXB13*, KIT*, MBD4*, MEN1, MLH1, MSH2, MSH3, MSH6, MUTYH, NF1, NTHL1, PALB2, PDGFRA*, PMS2, POLD1, POLE, PTEN, RAD51C, RAD51D, SDHA (sequencing only), SDHB, SDHC, SDHD, SMAD4, SMARCA4, STK11, TP53, TSC1, TSC2, VHL.  *Genes without RNA analysis.    12/07/2022 Cancer Staging   Staging form: Breast, AJCC 8th Edition - Pathologic stage from 12/07/2022: Stage IA (pT1a, pN0, cM0, G2, ER+, PR-, HER2+) -  Signed by Malachy Mood, MD on 12/20/2022 Stage prefix: Initial diagnosis Histologic grading system: 3 grade system Residual tumor (R): R0 - None   02/02/2023 -  Chemotherapy   Patient is on Treatment Plan : BREAST Abraxane + Trastuzumab q7d / Trastuzumab q21d         REVIEW OF SYSTEMS:   Constitutional: Denies fevers, chills or abnormal weight loss Eyes: Denies blurriness of vision Ears, nose, mouth, throat, and face: Denies mucositis or sore throat Respiratory: Denies cough, dyspnea or wheezes Cardiovascular: Denies palpitation, chest discomfort or lower extremity swelling Gastrointestinal:  Denies nausea, heartburn or change in bowel habits Gu: Patient reports frequency and urgency of urination. Skin: Denies abnormal skin rashes Lymphatics: Denies new lymphadenopathy or easy  bruising Neurological:Denies numbness, tingling or new weaknesses Behavioral/Psych: Mood is stable, no new changes  All other systems were reviewed with the patient and are negative.   VITALS:   Today's Vitals   02/23/23 1018 02/23/23 1019  BP: (!) 146/78   Pulse: 93   Resp: 18   Temp: 98.1 F (36.7 C)   TempSrc: Tympanic   SpO2: 99%   Weight: 179 lb 14.4 oz (81.6 kg)   Height: 5\' 6"  (1.676 m)   PainSc:  0-No pain   Body mass index is 29.04 kg/m.   Wt Readings from Last 3 Encounters:  02/23/23 179 lb 14.4 oz (81.6 kg)  02/17/23 178 lb 0.6 oz (80.8 kg)  02/09/23 178 lb 9.6 oz (81 kg)    Body mass index is 29.04 kg/m.  Performance status (ECOG): 1 - Symptomatic but completely ambulatory  PHYSICAL EXAM:   GENERAL:alert, no distress and comfortable SKIN: skin color, texture, turgor are normal, no rashes or significant lesions EYES: normal, Conjunctiva are pink and non-injected, sclera clear OROPHARYNX:no exudate, no erythema and lips, buccal mucosa, and tongue normal  NECK: supple, thyroid normal size, non-tender, without nodularity LYMPH:  no palpable lymphadenopathy in the cervical, axillary or inguinal LUNGS: clear to auscultation and percussion with normal breathing effort HEART: regular rate & rhythm and no murmurs and no lower extremity edema ABDOMEN:abdomen soft, non-tender and normal bowel sounds Musculoskeletal:no cyanosis of digits and no clubbing  NEURO: alert & oriented x 3 with fluent speech, no focal motor/sensory deficits  LABORATORY DATA:  I have reviewed the data as listed    Component Value Date/Time   NA 139 02/17/2023 1022   K 3.5 02/17/2023 1022   CL 103 02/17/2023 1022   CO2 29 02/17/2023 1022   GLUCOSE 46 (L) 02/17/2023 1022   BUN 8 02/17/2023 1022   CREATININE 0.55 02/17/2023 1022   CALCIUM 9.5 02/17/2023 1022   PROT 6.9 02/17/2023 1022   ALBUMIN 4.2 02/17/2023 1022   AST 13 (L) 02/17/2023 1022   ALT 13 02/17/2023 1022   ALKPHOS 59  02/17/2023 1022   BILITOT 0.4 02/17/2023 1022   GFRNONAA >60 02/17/2023 1022   GFRAA >60 06/26/2019 0500    Lab Results  Component Value Date   WBC 5.0 02/23/2023   NEUTROABS 3.2 02/23/2023   HGB 12.1 02/23/2023   HCT 35.9 (L) 02/23/2023   MCV 87.3 02/23/2023   PLT 416 (H) 02/23/2023

## 2023-02-23 ENCOUNTER — Inpatient Hospital Stay: Payer: Medicare Other

## 2023-02-23 ENCOUNTER — Inpatient Hospital Stay: Payer: Medicare Other | Admitting: Nurse Practitioner

## 2023-02-23 ENCOUNTER — Encounter: Payer: Self-pay | Admitting: Nurse Practitioner

## 2023-02-23 VITALS — BP 146/78 | HR 93 | Temp 98.1°F | Resp 18 | Ht 66.0 in | Wt 179.9 lb

## 2023-02-23 VITALS — BP 143/77 | HR 80 | Resp 16

## 2023-02-23 DIAGNOSIS — Z170 Estrogen receptor positive status [ER+]: Secondary | ICD-10-CM

## 2023-02-23 DIAGNOSIS — C50411 Malignant neoplasm of upper-outer quadrant of right female breast: Secondary | ICD-10-CM

## 2023-02-23 DIAGNOSIS — Z5112 Encounter for antineoplastic immunotherapy: Secondary | ICD-10-CM | POA: Diagnosis not present

## 2023-02-23 DIAGNOSIS — Z95828 Presence of other vascular implants and grafts: Secondary | ICD-10-CM | POA: Insufficient documentation

## 2023-02-23 LAB — CBC WITH DIFFERENTIAL (CANCER CENTER ONLY)
Abs Immature Granulocytes: 0.03 10*3/uL (ref 0.00–0.07)
Basophils Absolute: 0.1 10*3/uL (ref 0.0–0.1)
Basophils Relative: 2 %
Eosinophils Absolute: 0.1 10*3/uL (ref 0.0–0.5)
Eosinophils Relative: 3 %
HCT: 35.9 % — ABNORMAL LOW (ref 36.0–46.0)
Hemoglobin: 12.1 g/dL (ref 12.0–15.0)
Immature Granulocytes: 1 %
Lymphocytes Relative: 24 %
Lymphs Abs: 1.2 10*3/uL (ref 0.7–4.0)
MCH: 29.4 pg (ref 26.0–34.0)
MCHC: 33.7 g/dL (ref 30.0–36.0)
MCV: 87.3 fL (ref 80.0–100.0)
Monocytes Absolute: 0.3 10*3/uL (ref 0.1–1.0)
Monocytes Relative: 6 %
Neutro Abs: 3.2 10*3/uL (ref 1.7–7.7)
Neutrophils Relative %: 64 %
Platelet Count: 416 10*3/uL — ABNORMAL HIGH (ref 150–400)
RBC: 4.11 MIL/uL (ref 3.87–5.11)
RDW: 13.3 % (ref 11.5–15.5)
WBC Count: 5 10*3/uL (ref 4.0–10.5)
nRBC: 0 % (ref 0.0–0.2)

## 2023-02-23 LAB — CMP (CANCER CENTER ONLY)
ALT: 12 U/L (ref 0–44)
AST: 10 U/L — ABNORMAL LOW (ref 15–41)
Albumin: 4.1 g/dL (ref 3.5–5.0)
Alkaline Phosphatase: 61 U/L (ref 38–126)
Anion gap: 6 (ref 5–15)
BUN: 8 mg/dL (ref 8–23)
CO2: 29 mmol/L (ref 22–32)
Calcium: 9.7 mg/dL (ref 8.9–10.3)
Chloride: 99 mmol/L (ref 98–111)
Creatinine: 0.57 mg/dL (ref 0.44–1.00)
GFR, Estimated: >60 mL/min (ref >60)
Glucose, Bld: 323 mg/dL — ABNORMAL HIGH (ref 70–99)
Potassium: 4.1 mmol/L (ref 3.5–5.1)
Sodium: 134 mmol/L — ABNORMAL LOW (ref 135–145)
Total Bilirubin: 0.6 mg/dL (ref <1.2)
Total Protein: 6.8 g/dL (ref 6.5–8.1)

## 2023-02-23 MED ORDER — HEPARIN SOD (PORK) LOCK FLUSH 100 UNIT/ML IV SOLN
500.0000 [IU] | Freq: Once | INTRAVENOUS | Status: AC | PRN
  Administered 2023-02-23: 500 [IU]

## 2023-02-23 MED ORDER — PROCHLORPERAZINE MALEATE 10 MG PO TABS
10.0000 mg | ORAL_TABLET | Freq: Once | ORAL | Status: AC
Start: 2023-02-23 — End: 2023-02-23
  Administered 2023-02-23: 10 mg via ORAL
  Filled 2023-02-23: qty 1

## 2023-02-23 MED ORDER — SODIUM CHLORIDE 0.9% FLUSH
10.0000 mL | INTRAVENOUS | Status: DC | PRN
  Administered 2023-02-23: 10 mL

## 2023-02-23 MED ORDER — SODIUM CHLORIDE 0.9% FLUSH
10.0000 mL | Freq: Once | INTRAVENOUS | Status: AC
  Administered 2023-02-23: 10 mL

## 2023-02-23 MED ORDER — PACLITAXEL PROTEIN-BOUND CHEMO INJECTION 100 MG
80.0000 mg/m2 | Freq: Once | INTRAVENOUS | Status: AC
  Administered 2023-02-23: 150 mg via INTRAVENOUS
  Filled 2023-02-23: qty 30

## 2023-02-23 MED ORDER — CIPROFLOXACIN HCL 500 MG PO TABS: 500.0000 mg | ORAL_TABLET | Freq: Two times a day (BID) | ORAL | 0 refills | Status: DC

## 2023-02-23 MED ORDER — ACETAMINOPHEN 325 MG PO TABS
650.0000 mg | ORAL_TABLET | Freq: Once | ORAL | Status: AC
  Administered 2023-02-23: 650 mg via ORAL
  Filled 2023-02-23: qty 2

## 2023-02-23 MED ORDER — TRASTUZUMAB-ANNS CHEMO 150 MG IV SOLR
2.0000 mg/kg | Freq: Once | INTRAVENOUS | Status: AC
  Administered 2023-02-23: 150 mg via INTRAVENOUS
  Filled 2023-02-23: qty 7.14

## 2023-02-23 MED ORDER — SODIUM CHLORIDE 0.9 % IV SOLN: Freq: Once | INTRAVENOUS | Status: AC

## 2023-02-23 MED ORDER — DIPHENHYDRAMINE HCL 50 MG/ML IJ SOLN
25.0000 mg | Freq: Once | INTRAMUSCULAR | Status: AC
  Administered 2023-02-23: 25 mg via INTRAVENOUS
  Filled 2023-02-23: qty 1

## 2023-02-23 NOTE — Patient Instructions (Signed)
[facility] - [facility]  Discharge Instructions: Thank you for choosing [facility] to provide your oncology and hematology care.   If you have a lab appointment with the Cancer Center, please go directly to the Cancer Center and check in at the registration area.   Wear comfortable clothing and clothing appropriate for easy access to any Portacath or PICC line.   We strive to give you quality time with your provider. You may need to reschedule your appointment if you arrive late (15 or more minutes).  Arriving late affects you and other patients whose appointments are after yours.  Also, if you miss three or more appointments without notifying the office, you may be dismissed from the clinic at the provider's discretion.      For prescription refill requests, have your pharmacy contact our office and allow 72 hours for refills to be completed.    Today you received the following chemotherapy and/or immunotherapy agents: Trastuzumab, Paclitaxel Protein-bound      To help prevent nausea and vomiting after your treatment, we encourage you to take your nausea medication as directed.  BELOW ARE SYMPTOMS THAT SHOULD BE REPORTED IMMEDIATELY: *FEVER GREATER THAN 100.4 F (38 C) OR HIGHER *CHILLS OR SWEATING *NAUSEA AND VOMITING THAT IS NOT CONTROLLED WITH YOUR NAUSEA MEDICATION *UNUSUAL SHORTNESS OF BREATH *UNUSUAL BRUISING OR BLEEDING *URINARY PROBLEMS (pain or burning when urinating, or frequent urination) *BOWEL PROBLEMS (unusual diarrhea, constipation, pain near the anus) TENDERNESS IN MOUTH AND THROAT WITH OR WITHOUT PRESENCE OF ULCERS (sore throat, sores in mouth, or a toothache) UNUSUAL RASH, SWELLING OR PAIN  UNUSUAL VAGINAL DISCHARGE OR ITCHING   Items with * indicate a potential emergency and should be followed up as soon as possible or go to the Emergency Department if any problems should occur.  Please show the CHEMOTHERAPY  ALERT CARD or IMMUNOTHERAPY ALERT CARD at check-in to the Emergency Department and triage nurse.  Should you have questions after your visit or need to cancel or reschedule your appointment, please contact [facility] - A DEPT OF [facility]  Dept: [phone]  and follow the prompts.  Office hours are 8:00 a.m. to 4:30 p.m. Monday - Friday. Please note that voicemails left after 4:00 p.m. may not be returned until the following business day.  We are closed weekends and major holidays. You have access to a nurse at all times for urgent questions. Please call the main number to the clinic Dept: [phone] and follow the prompts.   For any non-urgent questions, you may also contact your provider using MyChart. We now offer e-Visits for anyone 70 and older to request care online for non-urgent symptoms. For details visit [email or web address].   Also download the MyChart app! Go to the app store, search "MyChart", open the app, select , and log in with your MyChart username and password.

## 2023-02-26 ENCOUNTER — Other Ambulatory Visit: Payer: Self-pay | Admitting: Cardiovascular Disease

## 2023-02-26 ENCOUNTER — Other Ambulatory Visit: Payer: Self-pay

## 2023-02-26 DIAGNOSIS — I493 Ventricular premature depolarization: Secondary | ICD-10-CM

## 2023-02-28 ENCOUNTER — Telehealth: Payer: Self-pay

## 2023-02-28 ENCOUNTER — Telehealth: Payer: Self-pay | Admitting: Hematology

## 2023-02-28 NOTE — Telephone Encounter (Signed)
See message.

## 2023-03-02 ENCOUNTER — Encounter: Payer: Self-pay | Admitting: *Deleted

## 2023-03-03 ENCOUNTER — Inpatient Hospital Stay: Payer: Medicare Other | Attending: Hematology

## 2023-03-03 ENCOUNTER — Other Ambulatory Visit: Payer: Self-pay

## 2023-03-03 ENCOUNTER — Other Ambulatory Visit: Payer: Medicare Other

## 2023-03-03 ENCOUNTER — Inpatient Hospital Stay: Payer: Medicare Other

## 2023-03-03 ENCOUNTER — Ambulatory Visit: Payer: Medicare Other | Admitting: Hematology

## 2023-03-03 VITALS — BP 137/86 | HR 91 | Temp 98.0°F | Resp 16 | Ht 66.0 in | Wt 180.4 lb

## 2023-03-03 DIAGNOSIS — Z5112 Encounter for antineoplastic immunotherapy: Secondary | ICD-10-CM | POA: Insufficient documentation

## 2023-03-03 DIAGNOSIS — Z5111 Encounter for antineoplastic chemotherapy: Secondary | ICD-10-CM | POA: Diagnosis present

## 2023-03-03 DIAGNOSIS — Z170 Estrogen receptor positive status [ER+]: Secondary | ICD-10-CM

## 2023-03-03 DIAGNOSIS — Z79899 Other long term (current) drug therapy: Secondary | ICD-10-CM | POA: Insufficient documentation

## 2023-03-03 DIAGNOSIS — C50411 Malignant neoplasm of upper-outer quadrant of right female breast: Secondary | ICD-10-CM | POA: Insufficient documentation

## 2023-03-03 LAB — CBC WITH DIFFERENTIAL (CANCER CENTER ONLY)
Abs Immature Granulocytes: 0.11 10*3/uL — ABNORMAL HIGH (ref 0.00–0.07)
Basophils Absolute: 0.1 10*3/uL (ref 0.0–0.1)
Basophils Relative: 2 %
Eosinophils Absolute: 0.2 10*3/uL (ref 0.0–0.5)
Eosinophils Relative: 4 %
HCT: 34.5 % — ABNORMAL LOW (ref 36.0–46.0)
Hemoglobin: 11.9 g/dL — ABNORMAL LOW (ref 12.0–15.0)
Immature Granulocytes: 2 %
Lymphocytes Relative: 30 %
Lymphs Abs: 1.5 10*3/uL (ref 0.7–4.0)
MCH: 29.9 pg (ref 26.0–34.0)
MCHC: 34.5 g/dL (ref 30.0–36.0)
MCV: 86.7 fL (ref 80.0–100.0)
Monocytes Absolute: 0.5 10*3/uL (ref 0.1–1.0)
Monocytes Relative: 9 %
Neutro Abs: 2.7 10*3/uL (ref 1.7–7.7)
Neutrophils Relative %: 53 %
Platelet Count: 374 10*3/uL (ref 150–400)
RBC: 3.98 MIL/uL (ref 3.87–5.11)
RDW: 14.1 % (ref 11.5–15.5)
WBC Count: 5.1 10*3/uL (ref 4.0–10.5)
nRBC: 0 % (ref 0.0–0.2)

## 2023-03-03 LAB — CMP (CANCER CENTER ONLY)
ALT: 12 U/L (ref 0–44)
AST: 12 U/L — ABNORMAL LOW (ref 15–41)
Albumin: 4.2 g/dL (ref 3.5–5.0)
Alkaline Phosphatase: 53 U/L (ref 38–126)
Anion gap: 7 (ref 5–15)
BUN: 7 mg/dL — ABNORMAL LOW (ref 8–23)
CO2: 28 mmol/L (ref 22–32)
Calcium: 9.6 mg/dL (ref 8.9–10.3)
Chloride: 102 mmol/L (ref 98–111)
Creatinine: 0.57 mg/dL (ref 0.44–1.00)
GFR, Estimated: >60 mL/min (ref >60)
Glucose, Bld: 97 mg/dL (ref 70–99)
Potassium: 3.5 mmol/L (ref 3.5–5.1)
Sodium: 137 mmol/L (ref 135–145)
Total Bilirubin: 0.4 mg/dL (ref <1.2)
Total Protein: 6.6 g/dL (ref 6.5–8.1)

## 2023-03-03 MED ORDER — PACLITAXEL PROTEIN-BOUND CHEMO INJECTION 100 MG
80.0000 mg/m2 | Freq: Once | INTRAVENOUS | Status: AC
  Administered 2023-03-03: 150 mg via INTRAVENOUS
  Filled 2023-03-03: qty 30

## 2023-03-03 MED ORDER — HEPARIN SOD (PORK) LOCK FLUSH 100 UNIT/ML IV SOLN
500.0000 [IU] | Freq: Once | INTRAVENOUS | Status: AC | PRN
  Administered 2023-03-03: 500 [IU]

## 2023-03-03 MED ORDER — TRASTUZUMAB-ANNS CHEMO 150 MG IV SOLR
2.0000 mg/kg | Freq: Once | INTRAVENOUS | Status: AC
  Administered 2023-03-03: 150 mg via INTRAVENOUS
  Filled 2023-03-03: qty 7.14

## 2023-03-03 MED ORDER — SODIUM CHLORIDE 0.9% FLUSH
10.0000 mL | INTRAVENOUS | Status: DC | PRN
  Administered 2023-03-03: 10 mL

## 2023-03-03 MED ORDER — DIPHENHYDRAMINE HCL 50 MG/ML IJ SOLN
25.0000 mg | Freq: Once | INTRAMUSCULAR | Status: AC
  Administered 2023-03-03: 25 mg via INTRAVENOUS
  Filled 2023-03-03: qty 1

## 2023-03-03 MED ORDER — PROCHLORPERAZINE MALEATE 10 MG PO TABS
10.0000 mg | ORAL_TABLET | Freq: Once | ORAL | Status: AC
Start: 2023-03-03 — End: 2023-03-03
  Administered 2023-03-03: 10 mg via ORAL
  Filled 2023-03-03: qty 1

## 2023-03-03 MED ORDER — ACETAMINOPHEN 325 MG PO TABS
650.0000 mg | ORAL_TABLET | Freq: Once | ORAL | Status: AC
  Administered 2023-03-03: 650 mg via ORAL
  Filled 2023-03-03: qty 2

## 2023-03-03 MED ORDER — SODIUM CHLORIDE 0.9 % IV SOLN: Freq: Once | INTRAVENOUS | Status: AC

## 2023-03-03 NOTE — Patient Instructions (Addendum)
[facility]   Discharge Instructions: Thank you for choosing [facility] to provide your oncology and hematology care.   If you have a lab appointment with the Cancer Center, please go directly to the Cancer Center and check in at the registration area.   Wear comfortable clothing and clothing appropriate for easy access to any Portacath or PICC line.   We strive to give you quality time with your provider. You may need to reschedule your appointment if you arrive late (15 or more minutes).  Arriving late affects you and other patients whose appointments are after yours.  Also, if you miss three or more appointments without notifying the office, you may be dismissed from the clinic at the provider's discretion.      For prescription refill requests, have your pharmacy contact our office and allow 72 hours for refills to be completed.    Today you received the following chemotherapy and/or immunotherapy agents: Trastuzumab (Kanjinti) and paclitaxel protein-bound (Abraxane)    To help prevent nausea and vomiting after your treatment, we encourage you to take your nausea medication as directed.  BELOW ARE SYMPTOMS THAT SHOULD BE REPORTED IMMEDIATELY: *FEVER GREATER THAN 100.4 F (38 C) OR HIGHER *CHILLS OR SWEATING *NAUSEA AND VOMITING THAT IS NOT CONTROLLED WITH YOUR NAUSEA MEDICATION *UNUSUAL SHORTNESS OF BREATH *UNUSUAL BRUISING OR BLEEDING *URINARY PROBLEMS (pain or burning when urinating, or frequent urination) *BOWEL PROBLEMS (unusual diarrhea, constipation, pain near the anus) TENDERNESS IN MOUTH AND THROAT WITH OR WITHOUT PRESENCE OF ULCERS (sore throat, sores in mouth, or a toothache) UNUSUAL RASH, SWELLING OR PAIN  UNUSUAL VAGINAL DISCHARGE OR ITCHING   Items with * indicate a potential emergency and should be followed up as soon as possible or go to the Emergency Department if any problems should  occur.  Please show the CHEMOTHERAPY ALERT CARD or IMMUNOTHERAPY ALERT CARD at check-in to the Emergency Department and triage nurse.  Should you have questions after your visit or need to cancel or reschedule your appointment, please contact [facility] - [facility]  Dept: [phone]  and follow the prompts.  Office hours are 8:00 a.m. to 4:30 p.m. Monday - Friday. Please note that voicemails left after 4:00 p.m. may not be returned until the following business day.  We are closed weekends and major holidays. You have access to a nurse at all times for urgent questions. Please call the main number to the clinic Dept: [phone] and follow the prompts.   For any non-urgent questions, you may also contact your provider using MyChart. We now offer e-Visits for anyone 54 and older to request care online for non-urgent symptoms. For details visit [email or web address].   Also download the MyChart app! Go to the app store, search "MyChart", open the app, select [facility], and log in with your MyChart username and password.

## 2023-03-10 ENCOUNTER — Inpatient Hospital Stay: Payer: Medicare Other

## 2023-03-10 ENCOUNTER — Inpatient Hospital Stay: Payer: Medicare Other | Admitting: Hematology

## 2023-03-10 ENCOUNTER — Encounter: Payer: Self-pay | Admitting: Hematology

## 2023-03-10 ENCOUNTER — Other Ambulatory Visit: Payer: Self-pay

## 2023-03-10 VITALS — BP 132/78 | HR 92 | Temp 97.2°F | Resp 18 | Wt 180.2 lb

## 2023-03-10 VITALS — BP 145/77 | HR 87 | Temp 97.7°F | Resp 16

## 2023-03-10 DIAGNOSIS — C50411 Malignant neoplasm of upper-outer quadrant of right female breast: Secondary | ICD-10-CM

## 2023-03-10 DIAGNOSIS — Z95828 Presence of other vascular implants and grafts: Secondary | ICD-10-CM

## 2023-03-10 DIAGNOSIS — Z170 Estrogen receptor positive status [ER+]: Secondary | ICD-10-CM | POA: Diagnosis not present

## 2023-03-10 DIAGNOSIS — Z5112 Encounter for antineoplastic immunotherapy: Secondary | ICD-10-CM | POA: Diagnosis not present

## 2023-03-10 LAB — CBC WITH DIFFERENTIAL (CANCER CENTER ONLY)
Abs Immature Granulocytes: 0.09 10*3/uL — ABNORMAL HIGH (ref 0.00–0.07)
Basophils Absolute: 0.1 10*3/uL (ref 0.0–0.1)
Basophils Relative: 2 %
Eosinophils Absolute: 0.2 10*3/uL (ref 0.0–0.5)
Eosinophils Relative: 4 %
HCT: 34.1 % — ABNORMAL LOW (ref 36.0–46.0)
Hemoglobin: 11.8 g/dL — ABNORMAL LOW (ref 12.0–15.0)
Immature Granulocytes: 2 %
Lymphocytes Relative: 29 %
Lymphs Abs: 1.5 10*3/uL (ref 0.7–4.0)
MCH: 30 pg (ref 26.0–34.0)
MCHC: 34.6 g/dL (ref 30.0–36.0)
MCV: 86.8 fL (ref 80.0–100.0)
Monocytes Absolute: 0.4 10*3/uL (ref 0.1–1.0)
Monocytes Relative: 8 %
Neutro Abs: 2.7 10*3/uL (ref 1.7–7.7)
Neutrophils Relative %: 55 %
Platelet Count: 334 10*3/uL (ref 150–400)
RBC: 3.93 MIL/uL (ref 3.87–5.11)
RDW: 14.2 % (ref 11.5–15.5)
WBC Count: 5 10*3/uL (ref 4.0–10.5)
nRBC: 0 % (ref 0.0–0.2)

## 2023-03-10 LAB — CMP (CANCER CENTER ONLY)
ALT: 18 U/L (ref 0–44)
AST: 17 U/L (ref 15–41)
Albumin: 3.9 g/dL (ref 3.5–5.0)
Alkaline Phosphatase: 58 U/L (ref 38–126)
Anion gap: 9 (ref 5–15)
BUN: 7 mg/dL — ABNORMAL LOW (ref 8–23)
CO2: 26 mmol/L (ref 22–32)
Calcium: 9 mg/dL (ref 8.9–10.3)
Chloride: 102 mmol/L (ref 98–111)
Creatinine: 0.51 mg/dL (ref 0.44–1.00)
GFR, Estimated: >60 mL/min (ref >60)
Glucose, Bld: 132 mg/dL — ABNORMAL HIGH (ref 70–99)
Potassium: 3.3 mmol/L — ABNORMAL LOW (ref 3.5–5.1)
Sodium: 137 mmol/L (ref 135–145)
Total Bilirubin: 0.4 mg/dL (ref <1.2)
Total Protein: 6.4 g/dL — ABNORMAL LOW (ref 6.5–8.1)

## 2023-03-10 MED ORDER — SODIUM CHLORIDE 0.9% FLUSH
10.0000 mL | Freq: Once | INTRAVENOUS | Status: AC
  Administered 2023-03-10: 10 mL

## 2023-03-10 MED ORDER — PROCHLORPERAZINE MALEATE 10 MG PO TABS
10.0000 mg | ORAL_TABLET | Freq: Once | ORAL | Status: AC
Start: 2023-03-10 — End: 2023-03-10
  Administered 2023-03-10: 10 mg via ORAL
  Filled 2023-03-10: qty 1

## 2023-03-10 MED ORDER — ACETAMINOPHEN 325 MG PO TABS
650.0000 mg | ORAL_TABLET | Freq: Once | ORAL | Status: AC
  Administered 2023-03-10: 650 mg via ORAL
  Filled 2023-03-10: qty 2

## 2023-03-10 MED ORDER — SODIUM CHLORIDE 0.9 % IV SOLN: Freq: Once | INTRAVENOUS | Status: AC

## 2023-03-10 MED ORDER — TRASTUZUMAB-ANNS CHEMO 150 MG IV SOLR
2.0000 mg/kg | Freq: Once | INTRAVENOUS | Status: AC
  Administered 2023-03-10: 150 mg via INTRAVENOUS
  Filled 2023-03-10: qty 7.14

## 2023-03-10 MED ORDER — DIPHENHYDRAMINE HCL 50 MG/ML IJ SOLN
25.0000 mg | Freq: Once | INTRAMUSCULAR | Status: AC
  Administered 2023-03-10: 25 mg via INTRAVENOUS
  Filled 2023-03-10: qty 1

## 2023-03-10 MED ORDER — HEPARIN SOD (PORK) LOCK FLUSH 100 UNIT/ML IV SOLN
500.0000 [IU] | Freq: Once | INTRAVENOUS | Status: AC | PRN
  Administered 2023-03-10: 500 [IU]

## 2023-03-10 MED ORDER — SODIUM CHLORIDE 0.9% FLUSH
10.0000 mL | INTRAVENOUS | Status: DC | PRN
  Administered 2023-03-10: 10 mL

## 2023-03-10 MED ORDER — PACLITAXEL PROTEIN-BOUND CHEMO INJECTION 100 MG
80.0000 mg/m2 | Freq: Once | INTRAVENOUS | Status: AC
  Administered 2023-03-10: 150 mg via INTRAVENOUS
  Filled 2023-03-10: qty 30

## 2023-03-10 NOTE — Progress Notes (Signed)
[facility]   Telephone:([phone]) [phone] Fax:([phone]   Clinic Follow up Note   Patient Care Team: Tisovec, Adelfa Koh, MD as PCP - General (Internal Medicine) Iran Ouch, MD as PCP - Cardiology (Cardiology) Malachy Mood, MD as Consulting Physician (Hematology) Emelia Loron, MD as Consulting Physician (General Surgery) Dorothy Puffer, MD as Consulting Physician (Radiation Oncology) Pershing Proud, RN as Oncology Nurse Navigator Donnelly Angelica, RN as Oncology Nurse Navigator  Date of Service:  03/10/2023  CHIEF COMPLAINT: f/u of breast cancer  CURRENT THERAPY:  Adjuvant chemotherapy with weekly Taxol and trastuzumab  Oncology History   Malignant neoplasm of upper-outer quadrant of right breast in female, estrogen receptor positive (HCC) pT1aN0M0, stage IA, G2, HER2 positive, ER positive, PR negative, HER2+, and DCIS(+) It was discovered on screening mammogram, initial biopsy showed DCIS only.   -Status post a right lumpectomy.  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 Margins are negative for invasive cancer, but DCIS margin was close. No lymphovascular or perineural invasion. Lymph node biopsy was not performed, Dr. Dwain Sarna has ordered an ultrasound of the right axilla for further evaluation.  If ultrasound is negative, I think it is okay not to have sentinel lymph node biopsy given the small primary tumor. -Given the aggressive nature of her HER2 positive disease, and moderate risk of recurrence, I do recommend adjuvant chemotherapy with Taxol or Abraxane once weekly for 12 weeks. -she underwent port placement and right axillary SLN biopsy on 01/12/23 and all 3 nodes were negative   -She started to weekly paclitaxel and trastuzumab on February 02, 2023, plan to continue Taxol for 3 months and trastuzumab for 1 year    Assessment and Plan    Breast  Cancer 67 year old undergoing chemotherapy for breast cancer, currently in the sixth week. Reports mild hair loss, nausea, diarrhea, and facial erythema likely due to increased sun sensitivity from chemotherapy. Mild anemia attributed to chemotherapy. No significant neuropathy; uses ice packs on hands and feet to prevent neurotoxicity. Radiation therapy planned to start after a one-month break post-chemotherapy. Advised on sun protection due to increased sensitivity. Informed not to take iron supplements for anemia as it is chemotherapy-related. - Continue current chemotherapy regimen - Use Imodium for diarrhea as needed - Use anti-nausea medication as needed - Apply moisturizer for facial dryness - Avoid sun exposure and use sunscreen - Schedule follow-up with radiation oncologist towards the end of chemotherapy   Urinary Tract Infection (resolved) Previously treated with Ciprofloxacin. Currently asymptomatic and on prophylactic Macrodantin. - Continue Macrodantin daily except when on other antibiotics  Plan -Lab reviewed, adequate for treatment, will proceed chemo today and continue weekly -Follow-up in 2 weeks       SUMMARY OF ONCOLOGIC HISTORY: Oncology History  Malignant neoplasm of upper-outer quadrant of right breast in female, estrogen receptor positive (HCC)  10/27/2022 Cancer Staging   Staging form: Breast, AJCC 8th Edition - Clinical stage from 10/27/2022: Stage 0 (cTis (DCIS), cN0, cM0, G3, ER+, PR-, HER2: Not Assessed) - Signed by Malachy Mood, MD on 11/03/2022 Stage prefix: Initial diagnosis Histologic grading system: 3 grade system   11/01/2022 Initial Diagnosis   Ductal carcinoma in situ (DCIS) of right breast   11/09/2022 Genetic Testing   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  No other deleterious variants in [employer] Common Hereditary Cancers +RNA Panel. Report date is 11/09/2022.  The [employer] Common Hereditary Cancers + RNA Panel includes  sequencing, deletion/duplication, and RNA analysis of the following 48 genes: APC, ATM, AXIN2, BAP1, BARD1, BMPR1A, BRCA1, BRCA2, BRIP1, CDH1, CDK4*, CDKN2A*, CHEK2, CTNNA1, DICER1, EPCAM* (del/dup only), FH, GREM1* (promoter dup analysis only), HOXB13*, KIT*, MBD4*, MEN1, MLH1, MSH2, MSH3, MSH6, MUTYH, NF1, NTHL1, PALB2, PDGFRA*, PMS2, POLD1, POLE, PTEN, RAD51C, RAD51D, SDHA (sequencing only), SDHB, SDHC, SDHD, SMAD4, SMARCA4, STK11, TP53, TSC1, TSC2, VHL.  *Genes without RNA analysis.    12/07/2022 Cancer Staging   Staging form: Breast, AJCC 8th Edition - Pathologic stage from 12/07/2022: Stage IA (pT1a, pN0, cM0, G2, ER+, PR-, HER2+) - Signed by Malachy Mood, MD on 12/20/2022 Stage prefix: Initial diagnosis Histologic grading system: 3 grade system Residual tumor (R): R0 - None   02/02/2023 -  Chemotherapy   Patient is on Treatment Plan : BREAST Abraxane + Trastuzumab q7d / Trastuzumab q21d        Discussed the use of AI scribe software for clinical note transcription with the patient, who gave verbal consent to proceed.  History of Present Illness   The patient, a 67 year old with breast cancer, is halfway through her chemotherapy regimen. She reports mild hair loss and scalp tenderness, which she attributes to the chemotherapy. She has not been using a cold cap. She also reports red splotches on her face, which she questions may be a side effect of the chemotherapy. The patient reports that she felt unwell earlier in the week, experiencing nausea and diarrhea, but these symptoms were managed with nausea medication and Imodium.  The patient also reports occasional numbness and tingling in her toes and feet at night, which resolves with movement. She has been using a Susie pad with ice on her hands and feet to prevent neuropathy. She also has a history of anemia, which she has managed for a long time.         All other systems were reviewed with the patient and are negative.  MEDICAL HISTORY:   Past Medical History:  Diagnosis Date   Anemia    Arthritis    Atypical chest pain    a. 05/2018 CTA chest: No PE; b. 05/2018 MV: No ischemia/scar. EF 65%.   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 05/2018 CT Chest: Coronary and Ao atherosclerotic Calcifications.   Diabetes mellitus without complication (HCC)    Diastolic dysfunction    a. 05/2018 Echo: EF 60-65%, impaired relaxation. Nl RVSP.   Dysrhythmia    PVC's   Family history of adverse reaction to anesthesia    sister-malignant hyperthermia   FH of Malignant hyperthermia    a. sister had malginant hyperthermia   History of kidney stones    History of renal insufficiency    History of skin cancer    Hyperlipemia    Osteoporosis    Pulmonary nodules    a. 05/2018 CT Chest: Small pulm nodules measuring up to 6mm avg diameter. Rec non-contrast Chest CT in 6-12 mos.   Thyroid nodule     SURGICAL HISTORY: Past Surgical History:  Procedure Laterality Date   AXILLARY LYMPH NODE BIOPSY Right 01/12/2023   Procedure: RIGHT AXILLARY SENTINEL NODE BIOPSY;  Surgeon: Emelia Loron, MD;  Location: [facility] OR;  Service: General;  Laterality: Right;  LMA   BREAST BIOPSY Right 10/27/2022   MM RT BREAST BX W LOC DEV 1ST  LESION IMAGE BX SPEC STEREO GUIDE 10/27/2022 [facility]MO[facility]   BREAST BIOPSY  12/06/2022   MM RT RADIOACTIVE SEED LOC MAMMO GUIDE 12/06/2022 [facility]MO[facility]   BREAST LUMPECTOMY WITH RADIOACTIVE SEED LOCALIZATION Right 12/07/2022   Procedure: RIGHT BREAST LUMPECTOMY WITH RADIOACTIVE SEED LOCALIZATION;  Surgeon: Emelia Loron, MD;  Location: [facility] OR;  Service: General;  Laterality: Right;   CESAREAN SECTION     CHOLECYSTECTOMY  2006   lap choli with umb hernia   COLONOSCOPY     HYSTEROSCOPY WITH D & C N/A 12/07/2022   Procedure: DILATATION AND CURETTAGE /HYSTEROSCOPY WITH POLYPECTOMY ENDOMETRIAL BIOPSY;  Surgeon:  Waynard Reeds, MD;  Location: [facility] OR;  Service: Gynecology;  Laterality: N/A;   LIPOMA EXCISION Left 03/15/2014   Procedure: EXCISION LIPOMA LEFT LOWER QUARDRANT ABDOMINAL WALL;  Surgeon: Violeta Gelinas, MD;  Location: [facility];  Service: General;  Laterality: Left;   PORTACATH PLACEMENT N/A 01/12/2023   Procedure: PORT PLACEMENT WITH ULTRASOUND GUIDANCE;  Surgeon: Emelia Loron, MD;  Location: [facility] OR;  Service: General;  Laterality: N/A;   THYROIDECTOMY N/A 06/25/2019   Procedure: TOTAL THYROIDECTOMY;  Surgeon: Darnell Level, MD;  Location: [facility];  Service: General;  Laterality: N/A;   TONSILLECTOMY     TRIGGER FINGER RELEASE  2012   lt small,middle,long   TRIGGER FINGER RELEASE Right 07/14/2017   Procedure: RELEASE TRIGGER FINGER/A-1 PULLEY RIGHT THUMB;  Surgeon: Cindee Salt, MD;  Location: [facility];  Service: Orthopedics;  Laterality: Right;   UMBILICAL HERNIA REPAIR  2006   with lap choli    I have reviewed the social history and family history with the patient and they are unchanged from previous note.  ALLERGIES:  has no known allergies.  MEDICATIONS:  Current Outpatient Medications  Medication Sig Dispense Refill   aspirin EC 81 MG tablet Take 81 mg by mouth daily.     ciprofloxacin (CIPRO) 500 MG tablet Take 1 tablet (500 mg total) by mouth 2 (two) times daily. 14 tablet 0   Insulin Glargine (BASAGLAR KWIKPEN) 100 UNIT/ML SOPN Inject 35 Units into the skin at bedtime.     insulin lispro (HUMALOG) 100 UNIT/ML injection Inject 10-15 Units into the skin 3 (three) times daily before meals. Sliding scale     levothyroxine (SYNTHROID) 112 MCG tablet Take 112 mcg by mouth daily before breakfast.     lidocaine-prilocaine (EMLA) cream Apply to affected area once 30 g 3   metFORMIN (GLUCOPHAGE) 1000 MG tablet Take 1,000 mg by mouth 2 (two) times daily with a meal.     metoprolol succinate (TOPROL-XL) 25 MG 24 hr tablet TAKE 1 TABLET (25 MG TOTAL) BY MOUTH  DAILY. 90 tablet 0   nitrofurantoin (MACRODANTIN) 100 MG capsule Take 100 mg by mouth daily.     Nutritional Supplements (JUICE PLUS FIBRE PO) Take 3 tablets by mouth daily. Juice Plus Vegetable     Nutritional Supplements (JUICE PLUS FIBRE PO) Take 3 tablets by mouth daily. Juice Plus Fruit     ondansetron (ZOFRAN) 8 MG tablet Take 1 tablet (8 mg total) by mouth every 8 (eight) hours as needed for nausea or vomiting. 30 tablet 1   prochlorperazine (COMPAZINE) 10 MG tablet Take 1 tablet (10 mg total) by mouth every 6 (six) hours as needed for nausea or vomiting. 30 tablet 1   ramipril (ALTACE) 5 MG capsule Take 5 mg by mouth daily with breakfast.     rosuvastatin (CRESTOR) 20 MG tablet Take 20 mg by  mouth daily with supper.      triamcinolone cream (KENALOG) 0.1 % Apply 1 Application topically daily as needed (dermatitis).     No current facility-administered medications for this visit.   Facility-Administered Medications Ordered in Other Visits  Medication Dose Route Frequency Provider Last Rate Last Admin   sodium chloride flush (NS) 0.9 % injection 10 mL  10 mL Intracatheter PRN Malachy Mood, MD   10 mL at 03/10/23 1646    PHYSICAL EXAMINATION: ECOG PERFORMANCE STATUS: 1 - Symptomatic but completely ambulatory  Vitals:   03/10/23 1310  BP: 132/78  Pulse: 92  Resp: 18  Temp: (!) 97.2 F (36.2 C)  SpO2: 99%   Wt Readings from Last 3 Encounters:  03/10/23 180 lb 3.2 oz (81.7 kg)  03/03/23 180 lb 6.4 oz (81.8 kg)  02/23/23 179 lb 14.4 oz (81.6 kg)     GENERAL:alert, no distress and comfortable SKIN: skin color, texture, turgor are normal, no rashes or significant lesions except a few spots with slight hypopigmentation on face EYES: normal, Conjunctiva are pink and non-injected, sclera clear NECK: supple, thyroid normal size, non-tender, without nodularity LYMPH:  no palpable lymphadenopathy in the cervical, axillary  LUNGS: clear to auscultation and percussion with normal  breathing effort HEART: regular rate & rhythm and no murmurs and no lower extremity edema ABDOMEN:abdomen soft, non-tender and normal bowel sounds Musculoskeletal:no cyanosis of digits and no clubbing  NEURO: alert & oriented x 3 with fluent speech, no focal motor/sensory deficits   LABORATORY DATA:  I have reviewed the data as listed    Latest Ref Rng & Units 03/10/2023   12:44 PM 03/03/2023    1:51 PM 02/23/2023    9:56 AM  CBC  WBC 4.0 - 10.5 K/uL 5.0  5.1  5.0   Hemoglobin 12.0 - 15.0 g/dL 16.1  09.6  04.5   Hematocrit 36.0 - 46.0 % 34.1  34.5  35.9   Platelets 150 - 400 K/uL 334  374  416         Latest Ref Rng & Units 03/10/2023   12:44 PM 03/03/2023    1:51 PM 02/23/2023    9:56 AM  CMP  Glucose 70 - 99 mg/dL 409  97  811   BUN 8 - 23 mg/dL 7  7  8    Creatinine 0.44 - 1.00 mg/dL 9.14  7.82  9.56   Sodium 135 - 145 mmol/L 137  137  134   Potassium 3.5 - 5.1 mmol/L 3.3  3.5  4.1   Chloride 98 - 111 mmol/L 102  102  99   CO2 22 - 32 mmol/L 26  28  29    Calcium 8.9 - 10.3 mg/dL 9.0  9.6  9.7   Total Protein 6.5 - 8.1 g/dL 6.4  6.6  6.8   Total Bilirubin <1.2 mg/dL 0.4  0.4  0.6   Alkaline Phos 38 - 126 U/L 58  53  61   AST 15 - 41 U/L 17  12  10    ALT 0 - 44 U/L 18  12  12        RADIOGRAPHIC STUDIES: I have personally reviewed the radiological images as listed and agreed with the findings in the report. No results found.    No orders of the defined types were placed in this encounter.  All questions were answered. The patient knows to call the clinic with any problems, questions or concerns. No barriers to learning was detected. The total time  spent in the appointment was 25 minutes.     Malachy Mood, MD 03/10/2023

## 2023-03-10 NOTE — Patient Instructions (Addendum)
[facility] - A DEPT OF [facility]  Discharge Instructions: Thank you for choosing [facility] to provide your oncology and hematology care.   If you have a lab appointment with the Cancer Center, please go directly to the Cancer Center and check in at the registration area.   Wear comfortable clothing and clothing appropriate for easy access to any Portacath or PICC line.   We strive to give you quality time with your provider. You may need to reschedule your appointment if you arrive late (15 or more minutes).  Arriving late affects you and other patients whose appointments are after yours.  Also, if you miss three or more appointments without notifying the office, you may be dismissed from the clinic at the provider's discretion.      For prescription refill requests, have your pharmacy contact our office and allow 72 hours for refills to be completed.    Today you received the following chemotherapy and/or immunotherapy agents: Trastuzumab  (Kanjinti) and Paclitaxel -protein bound (Abraxane).    To help prevent nausea and vomiting after your treatment, we encourage you to take your nausea medication as directed.  BELOW ARE SYMPTOMS THAT SHOULD BE REPORTED IMMEDIATELY: *FEVER GREATER THAN 100.4 F (38 C) OR HIGHER *CHILLS OR SWEATING *NAUSEA AND VOMITING THAT IS NOT CONTROLLED WITH YOUR NAUSEA MEDICATION *UNUSUAL SHORTNESS OF BREATH *UNUSUAL BRUISING OR BLEEDING *URINARY PROBLEMS (pain or burning when urinating, or frequent urination) *BOWEL PROBLEMS (unusual diarrhea, constipation, pain near the anus) TENDERNESS IN MOUTH AND THROAT WITH OR WITHOUT PRESENCE OF ULCERS (sore throat, sores in mouth, or a toothache) UNUSUAL RASH, SWELLING OR PAIN  UNUSUAL VAGINAL DISCHARGE OR ITCHING   Items with * indicate a potential emergency and should be followed up as soon as possible or go to the Emergency Department if any problems should  occur.  Please show the CHEMOTHERAPY ALERT CARD or IMMUNOTHERAPY ALERT CARD at check-in to the Emergency Department and triage nurse.  Should you have questions after your visit or need to cancel or reschedule your appointment, please contact [facility] - A DEPT OF [facility]  Dept: [phone]  and follow the prompts.  Office hours are 8:00 a.m. to 4:30 p.m. Monday - Friday. Please note that voicemails left after 4:00 p.m. may not be returned until the following business day.  We are closed weekends and major holidays. You have access to a nurse at all times for urgent questions. Please call the main number to the clinic Dept: [phone] and follow the prompts.   For any non-urgent questions, you may also contact your provider using MyChart. We now offer e-Visits for anyone 79 and older to request care online for non-urgent symptoms. For details visit [email or web address].   Also download the MyChart app! Go to the app store, search "MyChart", open the app, select , and log in with your MyChart username and password.  Hypokalemia Hypokalemia means that the amount of potassium in the blood is lower than normal. Potassium is a mineral (electrolyte) that helps regulate the amount of fluid in the body. It also stimulates muscle tightening (contraction) and helps nerves work properly. Normally, most of the body's potassium is inside cells, and only a very small amount is in the blood. Because the amount in the blood is so small, minor changes to potassium levels in the blood can be life-threatening. What are the causes? This condition may  be caused by: Antibiotic medicine. Diarrhea or vomiting. Taking too much of a medicine that helps you have a bowel movement (laxative) can cause diarrhea and lead to hypokalemia. Chronic kidney disease (CKD). Medicines that help the body get rid of excess fluid (diuretics). Eating disorders, such as anorexia or  bulimia. Low magnesium levels in the body. Sweating a lot. What are the signs or symptoms? Symptoms of this condition include: Weakness. Constipation. Fatigue. Muscle cramps. Mental confusion. Skipped heartbeats or irregular heartbeat (palpitations). Tingling or numbness. How is this diagnosed? This condition is diagnosed with a blood test. How is this treated? This condition may be treated by: Taking potassium supplements. Adjusting the medicines that you take. Eating more foods that contain a lot of potassium. If your potassium level is very low, you may need to get potassium through an IV and be monitored in the hospital. Follow these instructions at home: Eating and drinking  Eat a healthy diet. A healthy diet includes fresh fruits and vegetables, whole grains, healthy fats, and lean proteins. If told, eat more foods that contain a lot of potassium. These include: Nuts, such as peanuts and pistachios. Seeds, such as sunflower seeds and pumpkin seeds. Peas, lentils, and lima beans. Whole grain and bran cereals and breads. Fresh fruits and vegetables, such as apricots, avocado, bananas, cantaloupe, kiwi, oranges, tomatoes, asparagus, and potatoes. Juices, such as orange, tomato, and prune. Lean meats, including fish. Milk and milk products, such as yogurt. General instructions Take over-the-counter and prescription medicines only as told by your health care provider. This includes vitamins, natural food products, and supplements. Keep all follow-up visits. This is important. Contact a health care provider if: You have weakness that gets worse. You feel your heart pounding or racing. You vomit. You have diarrhea. You have diabetes and you have trouble keeping your blood sugar in your target range. Get help right away if: You have chest pain. You have shortness of breath. You have vomiting or diarrhea that lasts for more than 2 days. You faint. These symptoms may be an  emergency. Get help right away. Call 911. Do not wait to see if the symptoms will go away. Do not drive yourself to the hospital. Summary Hypokalemia means that the amount of potassium in the blood is lower than normal. This condition is diagnosed with a blood test. Hypokalemia may be treated by taking potassium supplements, adjusting the medicines that you take, or eating more foods that are high in potassium. If your potassium level is very low, you may need to get potassium through an IV and be monitored in the hospital. This information is not intended to replace advice given to you by your health care provider. Make sure you discuss any questions you have with your health care provider. Document Revised: 11/27/2020 Document Reviewed: 11/27/2020 Elsevier Patient Education  2024 [employer].

## 2023-03-10 NOTE — Assessment & Plan Note (Signed)
pT1aN0M0, stage IA, G2, HER2 positive, ER positive, PR negative, HER2+, and DCIS(+) It was discovered on screening mammogram, initial biopsy showed DCIS only.   -Status post a right lumpectomy.  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 Margins are negative for invasive cancer, but DCIS margin was close. No lymphovascular or perineural invasion. Lymph node biopsy was not performed, Dr. Dwain Sarna has ordered an ultrasound of the right axilla for further evaluation.  If ultrasound is negative, I think it is okay not to have sentinel lymph node biopsy given the small primary tumor. -Given the aggressive nature of her HER2 positive disease, and moderate risk of recurrence, I do recommend adjuvant chemotherapy with Taxol or Abraxane once weekly for 12 weeks. -she underwent port placement and right axillary SLN biopsy on 01/12/23 and all 3 nodes were negative   -She started to weekly paclitaxel and trastuzumab on February 02, 2023, plan to continue Taxol for 3 months and trastuzumab for 1 year

## 2023-03-14 ENCOUNTER — Encounter: Payer: Self-pay | Admitting: Cardiology

## 2023-03-14 ENCOUNTER — Other Ambulatory Visit: Payer: Self-pay

## 2023-03-14 ENCOUNTER — Ambulatory Visit: Payer: Medicare Other | Attending: Cardiology | Admitting: Cardiology

## 2023-03-14 VITALS — BP 136/86 | HR 89 | Ht 66.0 in | Wt 177.2 lb

## 2023-03-14 DIAGNOSIS — I25118 Atherosclerotic heart disease of native coronary artery with other forms of angina pectoris: Secondary | ICD-10-CM

## 2023-03-14 DIAGNOSIS — Z170 Estrogen receptor positive status [ER+]: Secondary | ICD-10-CM

## 2023-03-14 DIAGNOSIS — E782 Mixed hyperlipidemia: Secondary | ICD-10-CM | POA: Diagnosis not present

## 2023-03-14 DIAGNOSIS — I10 Essential (primary) hypertension: Secondary | ICD-10-CM

## 2023-03-14 DIAGNOSIS — E118 Type 2 diabetes mellitus with unspecified complications: Secondary | ICD-10-CM

## 2023-03-14 DIAGNOSIS — E039 Hypothyroidism, unspecified: Secondary | ICD-10-CM

## 2023-03-14 DIAGNOSIS — C50411 Malignant neoplasm of upper-outer quadrant of right female breast: Secondary | ICD-10-CM

## 2023-03-14 NOTE — Progress Notes (Signed)
Cardiology Office Note:  .   Date:  03/14/2023  ID:  [name], DOB 12/24/1955, MRN [id number] PCP: Gaspar Garbe, MD  [facility] Providers Cardiologist:  Lorine Bears, MD    History of Present Illness: .   [name] is a 68 y.o. female with a past medical history of coronary atherosclerosis, type 2 diabetes, hypertension, hypothyroidism, hyperlipidemia, obesity, recent diagnoses of breast cancer, who presents today for follow-up.   She was hospitalized in March 2020 with atypical chest pain.  Echocardiogram showed normal LV systolic function and no regional wall motion abnormalities.  CT of the chest was negative for pulmonary embolism but did show coronary and aortic calcifications as well as small pulmonary nodules.  She underwent stress testing which was negative for ischemia. Pulmonary nodules have been stable on repeat CT imaging.  She was found to have thyroid nodules and underwent thyroidectomy on March 2021.  Pathology was benign and she had been continued on thyroid replacement therapy.   She was last seen in clinic 03/08/2022 at that time seen in the cardiac perspective.  She recently has been for PCP and GI she has been in the past. Had a tib/fib fracture the right lower extremity from a mechanical fall.  She was continued on her current medication regimen and no further testing needed at that time.  Screening mammogram completed in July 2024 showed a 1.7 cm group of indeterminate calcification in the upper outer right breast.  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  PR was negative.  She underwent lumpectomy without nodal removal.  She subsequently had to have nodes removed in October when she had a Chemo-Port placed.  At this point she is been undergoing chemotherapy for her breast cancer and has completed 6 treatments with 6 more treatments to go.  She would then be off of treatments for approximately 1 month and then undergo a month of radiation.   She  returns to clinic today stating that overall she has been doing fairly well from a cardiac perspective.  She denies any chest pain, shortness of breath, palpitations or peripheral edema.  She has undergone treatment for her recent diagnoses of breast cancer and has had just atypical nausea vomiting with diarrhea on occasion which her as needed medication she states and taking care of that.  She has noted that her sugars have been elevated from time to time.  Recent echocardiogram completed revealed an LVEF of 58-55%, no RWMA, G1 DD with mild mitral valve regurgitation.  She was advised that she would need to continue to have repeat echocardiograms in 3 months as she is undergoing treatments.  She states that she has been compliant with the remainder of her current medications and denies any recurrent hospitalizations or visits to the emergency department.  ROS: 10 point review of systems has been reviewed and considered negative with exception was been listed in the HPI  Studies Reviewed: Marland Kitchen     EKG:  Sinus rhythm rate of 99 with unifocal PVC change from prior studies  2D echo 12/23/2022 1. Left ventricular ejection fraction, by estimation, is 50 to 55%. The  left ventricle has low normal function. The left ventricle has no regional  wall motion abnormalities. The left ventricular internal cavity size was  mildly dilated. Left ventricular  diastolic parameters are consistent with Grade I diastolic dysfunction  (impaired relaxation). The average left ventricular global longitudinal  strain is -13.9 %.   2. Right ventricular systolic function  is normal. The right ventricular  size is normal. Tricuspid regurgitation signal is inadequate for assessing  PA pressure.   3. The mitral valve is normal in structure. Mild mitral valve  regurgitation. No evidence of mitral stenosis.   4. The aortic valve is tricuspid. There is mild calcification of the  aortic valve. Aortic valve regurgitation is not  visualized. Aortic valve  sclerosis is present, with no evidence of aortic valve stenosis.   5. There is mild dilatation of the ascending aorta, measuring 40 mm.   6. The inferior vena cava is normal in size with greater than 50%  respiratory variability, suggesting right atrial pressure of 3 mmHg.   Lexiscan MPI 06/16/2018 Normal pharmacologic myocardial perfusion stress test without significant ischemia or scar. The left ventricular ejection fraction is normal (65%). Attenuation correction CT shows coronary artery and aortic atherosclerotic calcification. This is a low risk study.  2D echo 06/15/2018 1. The left ventricle has normal systolic function with an ejection  fraction of 60-65%. The cavity size was normal. Left ventricular diastolic  Doppler parameters are consistent with impaired relaxation.   2. The right ventricle has normal systolic function. The cavity was  normal. There is no increase in right ventricular wall thickness. Normal  RVSP  Risk Assessment/Calculations:             Physical Exam:   VS:  BP 136/86   Pulse 89   Ht 5\' 6"  (1.676 m)   Wt 177 lb 3.2 oz (80.4 kg)   SpO2 98%   BMI 28.60 kg/m    Wt Readings from Last 3 Encounters:  03/14/23 177 lb 3.2 oz (80.4 kg)  03/10/23 180 lb 3.2 oz (81.7 kg)  03/03/23 180 lb 6.4 oz (81.8 kg)    GEN: Well nourished, well developed in no acute distress NECK: No JVD; No carotid bruits CARDIAC: RRR, no murmurs, rubs, gallops RESPIRATORY:  Clear to auscultation without rales, wheezing or rhonchi  ABDOMEN: Soft, non-tender, non-distended EXTREMITIES:  No edema; No deformity   ASSESSMENT AND PLAN: .   Coronary atherosclerosis noted on CT.  EKG today reveals sinus with unifocal PVC with no ischemic changes noted.  She is continued on aspirin and statin therapy for secondary prevention of coronary artery disease.  Primary hypertension with blood pressure today 136/86.  Blood pressures remain stable.  She is continued on  Toprol-XL 25 mg daily ramipril 5 mg daily.  She has been encouraged to continue to monitor blood pressure 1 to 2 hours postmedication administration as well.  Mixed hyperlipidemia with an LDL of 78 recently checked 01/2023.  She is continued on rosuvastatin 20 mg daily.  This continues to be managed by her PCP.  Type 2 diabetes which she is continued on metformin and insulin.  She states her blood sugars have been high since she has been on chemotherapy.  This continues to be managed by PCP.  Hypothyroidism where she is continued on levothyroxine. This is managaged by her PCP.   Recent diagnosis of breast cancer undergoing chemotherapy and soon to be going radiation.  She had an echocardiogram completed at the beginning of the treatment that revealed an LVEF of 50-55%, no RWMA, G1 DD, and mild mitral valve regurgitation.  She has been scheduled for an updated echocardiogram in the middle of her treatment and will need another 3 months after her treatment to reassess function.       Dispo: Patient return to clinic to see MD/APP in 6 months or  sooner if needed for reevaluation of symptoms.  Signed, Adair Lemar, NP

## 2023-03-14 NOTE — Patient Instructions (Signed)
Medication Instructions:  - No changes *If you need a refill on your cardiac medications before your next appointment, please call your pharmacy*  Lab Work: - None ordered  Testing/Procedures: Your physician has requested that you have an echocardiogram. Echocardiography is a painless test that uses sound waves to create images of your heart. It provides your doctor with information about the size and shape of your heart and how well your heart's chambers and valves are working. This procedure takes approximately one hour. There are no restrictions for this procedure. Please do NOT wear cologne, perfume, aftershave, or lotions (deodorant is allowed). Please arrive 15 minutes prior to your appointment time.  Please note: We ask at that you not bring children with you during ultrasound (echo/ vascular) testing. Due to room size and safety concerns, children are not allowed in the ultrasound rooms during exams. Our front office staff cannot provide observation of children in our lobby area while testing is being conducted. An adult accompanying a patient to their appointment will only be allowed in the ultrasound room at the discretion of the ultrasound technician under special circumstances. We apologize for any inconvenience.   Follow-Up: At [facility], you and your health needs are our priority.  As part of our continuing mission to provide you with exceptional heart care, we have created designated Provider Care Teams.  These Care Teams include your primary Cardiologist (physician) and Advanced Practice Providers (APPs -  Physician Assistants and Nurse Practitioners) who all work together to provide you with the care you need, when you need it.  Your next appointment:   6 month(s)  Provider:   Charlsie Quest, NP

## 2023-03-17 ENCOUNTER — Inpatient Hospital Stay: Payer: Medicare Other

## 2023-03-17 VITALS — BP 120/82 | HR 99 | Temp 98.3°F | Resp 16

## 2023-03-17 DIAGNOSIS — Z95828 Presence of other vascular implants and grafts: Secondary | ICD-10-CM

## 2023-03-17 DIAGNOSIS — Z170 Estrogen receptor positive status [ER+]: Secondary | ICD-10-CM

## 2023-03-17 DIAGNOSIS — Z5112 Encounter for antineoplastic immunotherapy: Secondary | ICD-10-CM | POA: Diagnosis not present

## 2023-03-17 LAB — CMP (CANCER CENTER ONLY)
ALT: 16 U/L (ref 0–44)
AST: 12 U/L — ABNORMAL LOW (ref 15–41)
Albumin: 4 g/dL (ref 3.5–5.0)
Alkaline Phosphatase: 61 U/L (ref 38–126)
Anion gap: 7 (ref 5–15)
BUN: 8 mg/dL (ref 8–23)
CO2: 27 mmol/L (ref 22–32)
Calcium: 9 mg/dL (ref 8.9–10.3)
Chloride: 101 mmol/L (ref 98–111)
Creatinine: 0.52 mg/dL (ref 0.44–1.00)
GFR, Estimated: >60 mL/min (ref >60)
Glucose, Bld: 205 mg/dL — ABNORMAL HIGH (ref 70–99)
Potassium: 3.6 mmol/L (ref 3.5–5.1)
Sodium: 135 mmol/L (ref 135–145)
Total Bilirubin: 0.4 mg/dL (ref <1.2)
Total Protein: 6.1 g/dL — ABNORMAL LOW (ref 6.5–8.1)

## 2023-03-17 LAB — CBC WITH DIFFERENTIAL (CANCER CENTER ONLY)
Abs Immature Granulocytes: 0.24 10*3/uL — ABNORMAL HIGH (ref 0.00–0.07)
Basophils Absolute: 0.1 10*3/uL (ref 0.0–0.1)
Basophils Relative: 2 %
Eosinophils Absolute: 0.3 10*3/uL (ref 0.0–0.5)
Eosinophils Relative: 5 %
HCT: 32.6 % — ABNORMAL LOW (ref 36.0–46.0)
Hemoglobin: 11.2 g/dL — ABNORMAL LOW (ref 12.0–15.0)
Immature Granulocytes: 4 %
Lymphocytes Relative: 25 %
Lymphs Abs: 1.4 10*3/uL (ref 0.7–4.0)
MCH: 29.9 pg (ref 26.0–34.0)
MCHC: 34.4 g/dL (ref 30.0–36.0)
MCV: 87.2 fL (ref 80.0–100.0)
Monocytes Absolute: 0.4 10*3/uL (ref 0.1–1.0)
Monocytes Relative: 8 %
Neutro Abs: 3.3 10*3/uL (ref 1.7–7.7)
Neutrophils Relative %: 56 %
Platelet Count: 435 10*3/uL — ABNORMAL HIGH (ref 150–400)
RBC: 3.74 MIL/uL — ABNORMAL LOW (ref 3.87–5.11)
RDW: 14.6 % (ref 11.5–15.5)
WBC Count: 5.7 10*3/uL (ref 4.0–10.5)
nRBC: 0 % (ref 0.0–0.2)

## 2023-03-17 MED ORDER — SODIUM CHLORIDE 0.9% FLUSH
10.0000 mL | Freq: Once | INTRAVENOUS | Status: AC
  Administered 2023-03-17: 10 mL

## 2023-03-17 MED ORDER — HEPARIN SOD (PORK) LOCK FLUSH 100 UNIT/ML IV SOLN
500.0000 [IU] | Freq: Once | INTRAVENOUS | Status: AC | PRN
Start: 2023-03-17 — End: 2023-03-17
  Administered 2023-03-17: 500 [IU]

## 2023-03-17 MED ORDER — SODIUM CHLORIDE 0.9 % IV SOLN
2.0000 mg/kg | Freq: Once | INTRAVENOUS | Status: AC
  Administered 2023-03-17: 150 mg via INTRAVENOUS
  Filled 2023-03-17: qty 7.14

## 2023-03-17 MED ORDER — SODIUM CHLORIDE 0.9% FLUSH
10.0000 mL | INTRAVENOUS | Status: DC | PRN
  Administered 2023-03-17: 10 mL

## 2023-03-17 MED ORDER — PACLITAXEL PROTEIN-BOUND CHEMO INJECTION 100 MG
80.0000 mg/m2 | Freq: Once | INTRAVENOUS | Status: AC
  Administered 2023-03-17: 150 mg via INTRAVENOUS
  Filled 2023-03-17: qty 30

## 2023-03-17 MED ORDER — PROCHLORPERAZINE MALEATE 10 MG PO TABS
10.0000 mg | ORAL_TABLET | Freq: Once | ORAL | Status: AC
  Administered 2023-03-17: 10 mg via ORAL
  Filled 2023-03-17: qty 1

## 2023-03-17 MED ORDER — DIPHENHYDRAMINE HCL 50 MG/ML IJ SOLN
25.0000 mg | Freq: Once | INTRAMUSCULAR | Status: AC
  Administered 2023-03-17: 25 mg via INTRAVENOUS
  Filled 2023-03-17: qty 1

## 2023-03-17 MED ORDER — ACETAMINOPHEN 325 MG PO TABS
650.0000 mg | ORAL_TABLET | Freq: Once | ORAL | Status: AC
  Administered 2023-03-17: 650 mg via ORAL
  Filled 2023-03-17: qty 2

## 2023-03-17 MED ORDER — SODIUM CHLORIDE 0.9 % IV SOLN: Freq: Once | INTRAVENOUS | Status: AC

## 2023-03-17 NOTE — Patient Instructions (Signed)
[facility] - A DEPT OF [facility]  Discharge Instructions: Thank you for choosing [facility] to provide your oncology and hematology care.   If you have a lab appointment with the Cancer Center, please go directly to the Cancer Center and check in at the registration area.   Wear comfortable clothing and clothing appropriate for easy access to any Portacath or PICC line.   We strive to give you quality time with your provider. You may need to reschedule your appointment if you arrive late (15 or more minutes).  Arriving late affects you and other patients whose appointments are after yours.  Also, if you miss three or more appointments without notifying the office, you may be dismissed from the clinic at the provider's discretion.      For prescription refill requests, have your pharmacy contact our office and allow 72 hours for refills to be completed.    Today you received the following chemotherapy and/or immunotherapy agents: Kanjinti, Abraxane      To help prevent nausea and vomiting after your treatment, we encourage you to take your nausea medication as directed.  BELOW ARE SYMPTOMS THAT SHOULD BE REPORTED IMMEDIATELY: *FEVER GREATER THAN 100.4 F (38 C) OR HIGHER *CHILLS OR SWEATING *NAUSEA AND VOMITING THAT IS NOT CONTROLLED WITH YOUR NAUSEA MEDICATION *UNUSUAL SHORTNESS OF BREATH *UNUSUAL BRUISING OR BLEEDING *URINARY PROBLEMS (pain or burning when urinating, or frequent urination) *BOWEL PROBLEMS (unusual diarrhea, constipation, pain near the anus) TENDERNESS IN MOUTH AND THROAT WITH OR WITHOUT PRESENCE OF ULCERS (sore throat, sores in mouth, or a toothache) UNUSUAL RASH, SWELLING OR PAIN  UNUSUAL VAGINAL DISCHARGE OR ITCHING   Items with * indicate a potential emergency and should be followed up as soon as possible or go to the Emergency Department if any problems should occur.  Please show the CHEMOTHERAPY ALERT CARD or  IMMUNOTHERAPY ALERT CARD at check-in to the Emergency Department and triage nurse.  Should you have questions after your visit or need to cancel or reschedule your appointment, please contact [facility] - [facility]  Dept: [phone]  and follow the prompts.  Office hours are 8:00 a.m. to 4:30 p.m. Monday - Friday. Please note that voicemails left after 4:00 p.m. may not be returned until the following business day.  We are closed weekends and major holidays. You have access to a nurse at all times for urgent questions. Please call the main number to the clinic Dept: [phone] and follow the prompts.   For any non-urgent questions, you may also contact your provider using MyChart. We now offer e-Visits for anyone 59 and older to request care online for non-urgent symptoms. For details visit [email or web address].   Also download the MyChart app! Go to the app store, search "MyChart", open the app, select [facility], and log in with your MyChart username and password.

## 2023-03-24 ENCOUNTER — Other Ambulatory Visit: Payer: Medicare Other

## 2023-03-24 ENCOUNTER — Ambulatory Visit: Payer: Medicare Other

## 2023-03-24 ENCOUNTER — Ambulatory Visit: Payer: Medicare Other | Admitting: Nurse Practitioner

## 2023-03-25 ENCOUNTER — Inpatient Hospital Stay: Payer: Medicare Other

## 2023-03-25 ENCOUNTER — Inpatient Hospital Stay: Payer: Medicare Other | Admitting: Nurse Practitioner

## 2023-03-25 VITALS — BP 148/70 | HR 87 | Temp 98.0°F | Resp 17 | Wt 179.4 lb

## 2023-03-25 DIAGNOSIS — Z170 Estrogen receptor positive status [ER+]: Secondary | ICD-10-CM

## 2023-03-25 DIAGNOSIS — Z95828 Presence of other vascular implants and grafts: Secondary | ICD-10-CM

## 2023-03-25 DIAGNOSIS — C50411 Malignant neoplasm of upper-outer quadrant of right female breast: Secondary | ICD-10-CM

## 2023-03-25 DIAGNOSIS — Z5112 Encounter for antineoplastic immunotherapy: Secondary | ICD-10-CM | POA: Diagnosis not present

## 2023-03-25 LAB — CBC WITH DIFFERENTIAL (CANCER CENTER ONLY)
Abs Immature Granulocytes: 0.07 10*3/uL (ref 0.00–0.07)
Basophils Absolute: 0.1 10*3/uL (ref 0.0–0.1)
Basophils Relative: 3 %
Eosinophils Absolute: 0.2 10*3/uL (ref 0.0–0.5)
Eosinophils Relative: 3 %
HCT: 33.5 % — ABNORMAL LOW (ref 36.0–46.0)
Hemoglobin: 11.7 g/dL — ABNORMAL LOW (ref 12.0–15.0)
Immature Granulocytes: 1 %
Lymphocytes Relative: 26 %
Lymphs Abs: 1.3 10*3/uL (ref 0.7–4.0)
MCH: 30.3 pg (ref 26.0–34.0)
MCHC: 34.9 g/dL (ref 30.0–36.0)
MCV: 86.8 fL (ref 80.0–100.0)
Monocytes Absolute: 0.4 10*3/uL (ref 0.1–1.0)
Monocytes Relative: 7 %
Neutro Abs: 3.1 10*3/uL (ref 1.7–7.7)
Neutrophils Relative %: 60 %
Platelet Count: 394 10*3/uL (ref 150–400)
RBC: 3.86 MIL/uL — ABNORMAL LOW (ref 3.87–5.11)
RDW: 14.7 % (ref 11.5–15.5)
WBC Count: 5.1 10*3/uL (ref 4.0–10.5)
nRBC: 0 % (ref 0.0–0.2)

## 2023-03-25 LAB — CMP (CANCER CENTER ONLY)
ALT: 12 U/L (ref 0–44)
AST: 11 U/L — ABNORMAL LOW (ref 15–41)
Albumin: 4.3 g/dL (ref 3.5–5.0)
Alkaline Phosphatase: 53 U/L (ref 38–126)
Anion gap: 6 (ref 5–15)
BUN: 8 mg/dL (ref 8–23)
CO2: 29 mmol/L (ref 22–32)
Calcium: 9.6 mg/dL (ref 8.9–10.3)
Chloride: 103 mmol/L (ref 98–111)
Creatinine: 0.52 mg/dL (ref 0.44–1.00)
GFR, Estimated: >60 mL/min (ref >60)
Glucose, Bld: 85 mg/dL (ref 70–99)
Potassium: 3.4 mmol/L — ABNORMAL LOW (ref 3.5–5.1)
Sodium: 138 mmol/L (ref 135–145)
Total Bilirubin: 0.5 mg/dL (ref <1.2)
Total Protein: 6.8 g/dL (ref 6.5–8.1)

## 2023-03-25 MED ORDER — SODIUM CHLORIDE 0.9% FLUSH
10.0000 mL | Freq: Once | INTRAVENOUS | Status: AC
  Administered 2023-03-25: 10 mL

## 2023-03-25 MED ORDER — DIPHENHYDRAMINE HCL 50 MG/ML IJ SOLN
25.0000 mg | Freq: Once | INTRAMUSCULAR | Status: AC
  Administered 2023-03-25: 25 mg via INTRAVENOUS
  Filled 2023-03-25: qty 1

## 2023-03-25 MED ORDER — PROCHLORPERAZINE MALEATE 10 MG PO TABS
10.0000 mg | ORAL_TABLET | Freq: Once | ORAL | Status: AC
Start: 2023-03-25 — End: 2023-03-25
  Administered 2023-03-25: 10 mg via ORAL
  Filled 2023-03-25: qty 1

## 2023-03-25 MED ORDER — HEPARIN SOD (PORK) LOCK FLUSH 100 UNIT/ML IV SOLN
500.0000 [IU] | Freq: Once | INTRAVENOUS | Status: AC | PRN
  Administered 2023-03-25: 500 [IU]

## 2023-03-25 MED ORDER — SODIUM CHLORIDE 0.9 % IV SOLN: Freq: Once | INTRAVENOUS | Status: AC

## 2023-03-25 MED ORDER — TRASTUZUMAB-ANNS CHEMO 150 MG IV SOLR
2.0000 mg/kg | Freq: Once | INTRAVENOUS | Status: AC
Start: 2023-03-25 — End: 2023-03-25
  Administered 2023-03-25: 150 mg via INTRAVENOUS
  Filled 2023-03-25: qty 7.14

## 2023-03-25 MED ORDER — SODIUM CHLORIDE 0.9% FLUSH
10.0000 mL | INTRAVENOUS | Status: DC | PRN
  Administered 2023-03-25: 10 mL

## 2023-03-25 MED ORDER — PACLITAXEL PROTEIN-BOUND CHEMO INJECTION 100 MG
80.0000 mg/m2 | Freq: Once | INTRAVENOUS | Status: AC
Start: 2023-03-25 — End: 2023-03-25
  Administered 2023-03-25: 150 mg via INTRAVENOUS
  Filled 2023-03-25: qty 30

## 2023-03-25 MED ORDER — ACETAMINOPHEN 325 MG PO TABS
650.0000 mg | ORAL_TABLET | Freq: Once | ORAL | Status: AC
  Administered 2023-03-25: 650 mg via ORAL
  Filled 2023-03-25: qty 2

## 2023-03-25 NOTE — Assessment & Plan Note (Signed)
 [id number], stage IA, G2, HER2 positive, ER positive, PR negative, HER2+, and DCIS(+) It was discovered on screening mammogram, initial biopsy showed DCIS only.   -Status post a right lumpectomy.  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 Margins are negative for invasive cancer, but DCIS margin was close. No lymphovascular or perineural invasion. Lymph node biopsy was not performed, Dr. Dwain Sarna has ordered an ultrasound of the right axilla for further evaluation.  If ultrasound is negative, I think it is okay not to have sentinel lymph node biopsy given the small primary tumor. -Given the aggressive nature of her HER2 positive disease, and moderate risk of recurrence, I do recommend adjuvant chemotherapy with Taxol or Abraxane once weekly for 12 weeks. -she underwent port placement and right axillary SLN biopsy on 01/12/23 and all 3 nodes were negative   -She started to weekly paclitaxel and trastuzumab on February 02, 2023, plan to continue Taxol for 3 months and trastuzumab for 1 year

## 2023-03-25 NOTE — Patient Instructions (Signed)
 [facility]  Discharge Instructions: Thank you for choosing [facility] to provide your oncology and hematology care.   If you have a lab appointment with the Cancer Center, please go directly to the Cancer Center and check in at the registration area.   Wear comfortable clothing and clothing appropriate for easy access to any Portacath or PICC line.   We strive to give you quality time with your provider. You may need to reschedule your appointment if you arrive late (15 or more minutes).  Arriving late affects you and other patients whose appointments are after yours.  Also, if you miss three or more appointments without notifying the office, you may be dismissed from the clinic at the provider's discretion.      For prescription refill requests, have your pharmacy contact our office and allow 72 hours for refills to be completed.    Today you received the following chemotherapy and/or immunotherapy agents: Kanjinti, Abraxane      To help prevent nausea and vomiting after your treatment, we encourage you to take your nausea medication as directed.  BELOW ARE SYMPTOMS THAT SHOULD BE REPORTED IMMEDIATELY: *FEVER GREATER THAN 100.4 F (38 C) OR HIGHER *CHILLS OR SWEATING *NAUSEA AND VOMITING THAT IS NOT CONTROLLED WITH YOUR NAUSEA MEDICATION *UNUSUAL SHORTNESS OF BREATH *UNUSUAL BRUISING OR BLEEDING *URINARY PROBLEMS (pain or burning when urinating, or frequent urination) *BOWEL PROBLEMS (unusual diarrhea, constipation, pain near the anus) TENDERNESS IN MOUTH AND THROAT WITH OR WITHOUT PRESENCE OF ULCERS (sore throat, sores in mouth, or a toothache) UNUSUAL RASH, SWELLING OR PAIN  UNUSUAL VAGINAL DISCHARGE OR ITCHING   Items with * indicate a potential emergency and should be followed up as soon as possible or go to the Emergency Department if any problems should occur.  Please show the CHEMOTHERAPY ALERT CARD or  IMMUNOTHERAPY ALERT CARD at check-in to the Emergency Department and triage nurse.  Should you have questions after your visit or need to cancel or reschedule your appointment, please contact [facility]  Dept: [phone]  and follow the prompts.  Office hours are 8:00 a.m. to 4:30 p.m. Monday - Friday. Please note that voicemails left after 4:00 p.m. may not be returned until the following business day.  We are closed weekends and major holidays. You have access to a nurse at all times for urgent questions. Please call the main number to the clinic Dept: [phone] and follow the prompts.   For any non-urgent questions, you may also contact your provider using MyChart. We now offer e-Visits for anyone 59 and older to request care online for non-urgent symptoms. For details visit [email or web address].   Also download the MyChart app! Go to the app store, search "MyChart", open the app, select [facility], and log in with your MyChart username and password.

## 2023-03-25 NOTE — Progress Notes (Unsigned)
Patient Care Team: Tisovec, Adelfa Koh, MD as PCP - General (Internal Medicine) Iran Ouch, MD as PCP - Cardiology (Cardiology) Malachy Mood, MD as Consulting Physician (Hematology) Emelia Loron, MD as Consulting Physician (General Surgery) Dorothy Puffer, MD as Consulting Physician (Radiation Oncology) Pershing Proud, RN as Oncology Nurse Navigator Donnelly Angelica, RN as Oncology Nurse Navigator  Clinic Day:  03/25/2023  Referring physician: Malachy Mood, MD  ASSESSMENT & PLAN:   Assessment & Plan: Malignant neoplasm of upper-outer quadrant of right breast in female, estrogen receptor positive (HCC) pT1aN0M0, stage IA, G2, HER2 positive, ER positive, PR negative, HER2+, and DCIS(+) It was discovered on screening mammogram, initial biopsy showed DCIS only.   -Status post a right lumpectomy.  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 Margins are negative for invasive cancer, but DCIS margin was close. No lymphovascular or perineural invasion. Lymph node biopsy was not performed, Dr. Dwain Sarna has ordered an ultrasound of the right axilla for further evaluation.  If ultrasound is negative, I think it is okay not to have sentinel lymph node biopsy given the small primary tumor. -Given the aggressive nature of her HER2 positive disease, and moderate risk of recurrence, I do recommend adjuvant chemotherapy with Taxol or Abraxane once weekly for 12 weeks. -she underwent port placement and right axillary SLN biopsy on 01/12/23 and all 3 nodes were negative   -She started to weekly paclitaxel and trastuzumab on February 02, 2023, plan to continue Taxol for 3 months and trastuzumab for 1 year    The patient understands the plans discussed today and is in agreement with them.  She knows to contact our office if she develops concerns prior to her next appointment.  I provided *** minutes of  face-to-face time during this encounter and > 50% was spent counseling as documented under my assessment and plan.    Carlean Jews, NP   [facility] - A DEPT [facility] [address] [place] [address] Dept: [phone] Dept Fax: [phone]   No orders of the defined types were placed in this encounter.     CHIEF COMPLAINT:  CC: Right breast cancer  Current Treatment: Weekly Taxol for 12 weeks, starting 02/05/2023.  Trastuzumab every 3 weeks also starting 02/05/2023.  INTERVAL HISTORY:  Caralena is here today for repeat clinical assessment.  She was last seen by Dr. Mosetta Putt on 03/10/2023.  Today is cycle 2 day 22.  She will get paclitaxel and trastuzumab.  She denies fevers or chills. She denies pain. Her appetite is good. Her weight {Weight change:10426}.  I have reviewed the past medical history, past surgical history, social history and family history with the patient and they are unchanged from previous note.  ALLERGIES:  has no known allergies.  MEDICATIONS:  Current Outpatient Medications  Medication Sig Dispense Refill   aspirin EC 81 MG tablet Take 81 mg by mouth daily.     Insulin Glargine (BASAGLAR KWIKPEN) 100 UNIT/ML SOPN Inject 35 Units into the skin at bedtime.     insulin lispro (HUMALOG) 100 UNIT/ML injection Inject 10-15 Units into the skin 3 (three) times daily before meals. Sliding scale     levothyroxine (SYNTHROID) 112 MCG tablet Take 112 mcg by mouth daily before breakfast.     lidocaine-prilocaine (EMLA) cream Apply to affected area once 30 g 3  metFORMIN (GLUCOPHAGE) 1000 MG tablet Take 1,000 mg by mouth 2 (two) times daily with a meal.     metoprolol succinate (TOPROL-XL) 25 MG 24 hr tablet TAKE 1 TABLET (25 MG TOTAL) BY MOUTH DAILY. 90 tablet 0   nitrofurantoin (MACRODANTIN) 100 MG capsule Take 100 mg by mouth daily.     Nutritional Supplements (JUICE PLUS FIBRE PO) Take 3 tablets  by mouth daily. Juice Plus Vegetable     Nutritional Supplements (JUICE PLUS FIBRE PO) Take 3 tablets by mouth daily. Juice Plus Fruit     ondansetron (ZOFRAN) 8 MG tablet Take 1 tablet (8 mg total) by mouth every 8 (eight) hours as needed for nausea or vomiting. 30 tablet 1   prochlorperazine (COMPAZINE) 10 MG tablet Take 1 tablet (10 mg total) by mouth every 6 (six) hours as needed for nausea or vomiting. 30 tablet 1   ramipril (ALTACE) 5 MG capsule Take 5 mg by mouth daily with breakfast.     rosuvastatin (CRESTOR) 20 MG tablet Take 20 mg by mouth daily with supper.      triamcinolone cream (KENALOG) 0.1 % Apply 1 Application topically daily as needed (dermatitis).     ciprofloxacin (CIPRO) 500 MG tablet Take 1 tablet (500 mg total) by mouth 2 (two) times daily. (Patient not taking: Reported on 03/25/2023) 14 tablet 0   No current facility-administered medications for this visit.    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  No other deleterious variants in [facility] Common Hereditary Cancers +RNA Panel. Report date is 11/09/2022.    The [facility] Common Hereditary Cancers + RNA Panel includes sequencing, deletion/duplication, and RNA analysis of the following 48 genes: APC, ATM, AXIN2, BAP1, BARD1, BMPR1A, BRCA1, BRCA2, BRIP1, CDH1, CDK4*, CDKN2A*, CHEK2, CTNNA1, DICER1, EPCAM* (del/dup only), FH, GREM1* (promoter dup analysis only),  HOXB13*, KIT*, MBD4*, MEN1, MLH1, MSH2, MSH3, MSH6, MUTYH, NF1, NTHL1, PALB2, PDGFRA*, PMS2, POLD1, POLE, PTEN, RAD51C, RAD51D, SDHA (sequencing only), SDHB, SDHC, SDHD, SMAD4, SMARCA4, STK11, TP53, TSC1, TSC2, VHL.  *Genes without RNA analysis.    12/07/2022 Cancer Staging   Staging form: Breast, AJCC 8th Edition - Pathologic stage from 12/07/2022: Stage IA (pT1a, pN0, cM0, G2, ER+, PR-, HER2+) - Signed by Malachy Mood, MD on 12/20/2022 Stage prefix: Initial diagnosis Histologic grading system: 3 grade system Residual tumor (R): R0 - None   02/02/2023 -  Chemotherapy   Patient is on Treatment Plan : BREAST Abraxane + Trastuzumab q7d / Trastuzumab q21d         REVIEW OF SYSTEMS:   Constitutional: Denies fevers, chills or abnormal weight loss Eyes: Denies blurriness of vision Ears, nose, mouth, throat, and face: Denies mucositis or sore throat Respiratory: Denies cough, dyspnea or wheezes Cardiovascular: Denies palpitation, chest discomfort or lower extremity swelling Gastrointestinal:  Denies nausea, heartburn or change in bowel habits Skin: Denies abnormal skin rashes Lymphatics: Denies new lymphadenopathy or easy bruising Neurological:Denies numbness, tingling or new weaknesses Behavioral/Psych: Mood is  stable, no new changes  All other systems were reviewed with the patient and are negative.   VITALS:  Blood pressure (!) 148/70, pulse 87, temperature 98 F (36.7 C), temperature source Temporal, resp. rate 17, weight 179 lb 6.4 oz (81.4 kg).  Wt Readings from Last 3 Encounters:  03/25/23 179 lb 6.4 oz (81.4 kg)  03/14/23 177 lb 3.2 oz (80.4 kg)  03/10/23 180 lb 3.2 oz (81.7 kg)    Body mass index is 28.96 kg/m.  Performance status (ECOG): {CHL ONC [id number]  PHYSICAL EXAM:   GENERAL:alert, no distress and comfortable SKIN: skin color, texture, turgor are normal, no rashes or significant lesions EYES: normal, Conjunctiva are pink and non-injected, sclera  clear OROPHARYNX:no exudate, no erythema and lips, buccal mucosa, and tongue normal  NECK: supple, thyroid normal size, non-tender, without nodularity LYMPH:  no palpable lymphadenopathy in the cervical, axillary or inguinal LUNGS: clear to auscultation and percussion with normal breathing effort HEART: regular rate & rhythm and no murmurs and no lower extremity edema ABDOMEN:abdomen soft, non-tender and normal bowel sounds Musculoskeletal:no cyanosis of digits and no clubbing  NEURO: alert & oriented x 3 with fluent speech, no focal motor/sensory deficits  LABORATORY DATA:  I have reviewed the data as listed    Component Value Date/Time   NA 138 03/25/2023 1258   K 3.4 (L) 03/25/2023 1258   CL 103 03/25/2023 1258   CO2 29 03/25/2023 1258   GLUCOSE 85 03/25/2023 1258   BUN 8 03/25/2023 1258   CREATININE 0.52 03/25/2023 1258   CALCIUM 9.6 03/25/2023 1258   PROT 6.8 03/25/2023 1258   ALBUMIN 4.3 03/25/2023 1258   AST 11 (L) 03/25/2023 1258   ALT 12 03/25/2023 1258   ALKPHOS 53 03/25/2023 1258   BILITOT 0.5 03/25/2023 1258   GFRNONAA >60 03/25/2023 1258   GFRAA >60 06/26/2019 0500    No results found for: "SPEP", "UPEP"  Lab Results  Component Value Date   WBC 5.1 03/25/2023   NEUTROABS 3.1 03/25/2023   HGB 11.7 (L) 03/25/2023   HCT 33.5 (L) 03/25/2023   MCV 86.8 03/25/2023   PLT 394 03/25/2023      Chemistry      Component Value Date/Time   NA 138 03/25/2023 1258   K 3.4 (L) 03/25/2023 1258   CL 103 03/25/2023 1258   CO2 29 03/25/2023 1258   BUN 8 03/25/2023 1258   CREATININE 0.52 03/25/2023 1258      Component Value Date/Time   CALCIUM 9.6 03/25/2023 1258   ALKPHOS 53 03/25/2023 1258   AST 11 (L) 03/25/2023 1258   ALT 12 03/25/2023 1258   BILITOT 0.5 03/25/2023 1258       RADIOGRAPHIC STUDIES: I have personally reviewed the radiological images as listed and agreed with the findings in the report. No results found.

## 2023-03-25 NOTE — Progress Notes (Signed)
Per Herbert Seta PA, ok to treat with ECHO from 12/23/22 with EF 50-55%

## 2023-03-31 ENCOUNTER — Inpatient Hospital Stay: Payer: Medicare Other | Attending: Hematology

## 2023-03-31 ENCOUNTER — Other Ambulatory Visit: Payer: Self-pay

## 2023-03-31 ENCOUNTER — Inpatient Hospital Stay: Payer: Medicare Other

## 2023-03-31 VITALS — BP 139/77 | HR 81 | Temp 97.6°F | Resp 20 | Wt 181.0 lb

## 2023-03-31 DIAGNOSIS — Z1731 Human epidermal growth factor receptor 2 positive status: Secondary | ICD-10-CM | POA: Insufficient documentation

## 2023-03-31 DIAGNOSIS — Z79899 Other long term (current) drug therapy: Secondary | ICD-10-CM | POA: Insufficient documentation

## 2023-03-31 DIAGNOSIS — C50411 Malignant neoplasm of upper-outer quadrant of right female breast: Secondary | ICD-10-CM | POA: Insufficient documentation

## 2023-03-31 DIAGNOSIS — Z5111 Encounter for antineoplastic chemotherapy: Secondary | ICD-10-CM | POA: Insufficient documentation

## 2023-03-31 DIAGNOSIS — Z1722 Progesterone receptor negative status: Secondary | ICD-10-CM | POA: Insufficient documentation

## 2023-03-31 DIAGNOSIS — Z170 Estrogen receptor positive status [ER+]: Secondary | ICD-10-CM | POA: Insufficient documentation

## 2023-03-31 DIAGNOSIS — Z5112 Encounter for antineoplastic immunotherapy: Secondary | ICD-10-CM | POA: Insufficient documentation

## 2023-03-31 DIAGNOSIS — Z95828 Presence of other vascular implants and grafts: Secondary | ICD-10-CM

## 2023-03-31 LAB — CBC WITH DIFFERENTIAL (CANCER CENTER ONLY)
Abs Immature Granulocytes: 0.05 10*3/uL (ref 0.00–0.07)
Basophils Absolute: 0.1 10*3/uL (ref 0.0–0.1)
Basophils Relative: 2 %
Eosinophils Absolute: 0.2 10*3/uL (ref 0.0–0.5)
Eosinophils Relative: 3 %
HCT: 33.3 % — ABNORMAL LOW (ref 36.0–46.0)
Hemoglobin: 11.5 g/dL — ABNORMAL LOW (ref 12.0–15.0)
Immature Granulocytes: 1 %
Lymphocytes Relative: 25 %
Lymphs Abs: 1.3 10*3/uL (ref 0.7–4.0)
MCH: 30 pg (ref 26.0–34.0)
MCHC: 34.5 g/dL (ref 30.0–36.0)
MCV: 86.9 fL (ref 80.0–100.0)
Monocytes Absolute: 0.3 10*3/uL (ref 0.1–1.0)
Monocytes Relative: 6 %
Neutro Abs: 3.1 10*3/uL (ref 1.7–7.7)
Neutrophils Relative %: 63 %
Platelet Count: 386 10*3/uL (ref 150–400)
RBC: 3.83 MIL/uL — ABNORMAL LOW (ref 3.87–5.11)
RDW: 14.6 % (ref 11.5–15.5)
WBC Count: 5 10*3/uL (ref 4.0–10.5)
nRBC: 0 % (ref 0.0–0.2)

## 2023-03-31 LAB — CMP (CANCER CENTER ONLY)
ALT: 13 U/L (ref 0–44)
AST: 11 U/L — ABNORMAL LOW (ref 15–41)
Albumin: 4.2 g/dL (ref 3.5–5.0)
Alkaline Phosphatase: 58 U/L (ref 38–126)
Anion gap: 7 (ref 5–15)
BUN: 9 mg/dL (ref 8–23)
CO2: 28 mmol/L (ref 22–32)
Calcium: 10 mg/dL (ref 8.9–10.3)
Chloride: 101 mmol/L (ref 98–111)
Creatinine: 0.59 mg/dL (ref 0.44–1.00)
GFR, Estimated: >60 mL/min (ref >60)
Glucose, Bld: 179 mg/dL — ABNORMAL HIGH (ref 70–99)
Potassium: 3.8 mmol/L (ref 3.5–5.1)
Sodium: 136 mmol/L (ref 135–145)
Total Bilirubin: 0.5 mg/dL (ref 0.0–1.2)
Total Protein: 6.8 g/dL (ref 6.5–8.1)

## 2023-03-31 MED ORDER — ACETAMINOPHEN 325 MG PO TABS
650.0000 mg | ORAL_TABLET | Freq: Once | ORAL | Status: AC
Start: 2023-03-31 — End: 2023-03-31
  Administered 2023-03-31: 650 mg via ORAL
  Filled 2023-03-31: qty 2

## 2023-03-31 MED ORDER — SODIUM CHLORIDE 0.9 % IV SOLN: Freq: Once | INTRAVENOUS | Status: AC

## 2023-03-31 MED ORDER — PROCHLORPERAZINE MALEATE 10 MG PO TABS
10.0000 mg | ORAL_TABLET | Freq: Once | ORAL | Status: AC
Start: 2023-03-31 — End: 2023-03-31
  Administered 2023-03-31: 10 mg via ORAL
  Filled 2023-03-31: qty 1

## 2023-03-31 MED ORDER — SODIUM CHLORIDE 0.9 % IV SOLN
2.0000 mg/kg | Freq: Once | INTRAVENOUS | Status: AC
  Administered 2023-03-31: 150 mg via INTRAVENOUS
  Filled 2023-03-31: qty 7.14

## 2023-03-31 MED ORDER — SODIUM CHLORIDE 0.9% FLUSH
10.0000 mL | INTRAVENOUS | Status: DC | PRN
  Administered 2023-03-31: 10 mL

## 2023-03-31 MED ORDER — DIPHENHYDRAMINE HCL 50 MG/ML IJ SOLN
25.0000 mg | Freq: Once | INTRAMUSCULAR | Status: AC
  Administered 2023-03-31: 25 mg via INTRAVENOUS
  Filled 2023-03-31: qty 1

## 2023-03-31 MED ORDER — SODIUM CHLORIDE 0.9% FLUSH
10.0000 mL | Freq: Once | INTRAVENOUS | Status: AC
Start: 2023-03-31 — End: 2023-03-31
  Administered 2023-03-31: 10 mL

## 2023-03-31 MED ORDER — HEPARIN SOD (PORK) LOCK FLUSH 100 UNIT/ML IV SOLN
500.0000 [IU] | Freq: Once | INTRAVENOUS | Status: AC | PRN
  Administered 2023-03-31: 500 [IU]

## 2023-03-31 MED ORDER — PACLITAXEL PROTEIN-BOUND CHEMO INJECTION 100 MG
80.0000 mg/m2 | Freq: Once | INTRAVENOUS | Status: AC
Start: 2023-03-31 — End: 2023-03-31
  Administered 2023-03-31: 150 mg via INTRAVENOUS
  Filled 2023-03-31: qty 30

## 2023-03-31 NOTE — Patient Instructions (Signed)
 [facility] - A DEPT OF [facility]  Discharge Instructions: Thank you for choosing [facility] to provide your oncology and hematology care.   If you have a lab appointment with the Cancer Center, please go directly to the Cancer Center and check in at the registration area.   Wear comfortable clothing and clothing appropriate for easy access to any Portacath or PICC line.   We strive to give you quality time with your provider. You may need to reschedule your appointment if you arrive late (15 or more minutes).  Arriving late affects you and other patients whose appointments are after yours.  Also, if you miss three or more appointments without notifying the office, you may be dismissed from the clinic at the provider's discretion.      For prescription refill requests, have your pharmacy contact our office and allow 72 hours for refills to be completed.    Today you received the following chemotherapy and/or immunotherapy agents: Trastuzumab  (Kanjinti ) and Paclitaxel -protein bound (Abraxane )      To help prevent nausea and vomiting after your treatment, we encourage you to take your nausea medication as directed.  BELOW ARE SYMPTOMS THAT SHOULD BE REPORTED IMMEDIATELY: *FEVER GREATER THAN 100.4 F (38 C) OR HIGHER *CHILLS OR SWEATING *NAUSEA AND VOMITING THAT IS NOT CONTROLLED WITH YOUR NAUSEA MEDICATION *UNUSUAL SHORTNESS OF BREATH *UNUSUAL BRUISING OR BLEEDING *URINARY PROBLEMS (pain or burning when urinating, or frequent urination) *BOWEL PROBLEMS (unusual diarrhea, constipation, pain near the anus) TENDERNESS IN MOUTH AND THROAT WITH OR WITHOUT PRESENCE OF ULCERS (sore throat, sores in mouth, or a toothache) UNUSUAL RASH, SWELLING OR PAIN  UNUSUAL VAGINAL DISCHARGE OR ITCHING   Items with * indicate a potential emergency and should be followed up as soon as possible or go to the Emergency Department if any problems should  occur.  Please show the CHEMOTHERAPY ALERT CARD or IMMUNOTHERAPY ALERT CARD at check-in to the Emergency Department and triage nurse.  Should you have questions after your visit or need to cancel or reschedule your appointment, please contact [facility] - [facility]  Dept: [phone]  and follow the prompts.  Office hours are 8:00 a.m. to 4:30 p.m. Monday - Friday. Please note that voicemails left after 4:00 p.m. may not be returned until the following business day.  We are closed weekends and major holidays. You have access to a nurse at all times for urgent questions. Please call the main number to the clinic Dept: [phone] and follow the prompts.   For any non-urgent questions, you may also contact your provider using MyChart. We now offer e-Visits for anyone 13 and older to request care online for non-urgent symptoms. For details visit [email or web address].   Also download the MyChart app! Go to the app store, search MyChart, open the app, select [place], and log in with your MyChart username and password.

## 2023-03-31 NOTE — Progress Notes (Signed)
 Per Dr. Mosetta Putt, ok for treatment today with ECHO from 12/23/22 EF 50-55%.

## 2023-04-01 ENCOUNTER — Encounter: Payer: Self-pay | Admitting: Nurse Practitioner

## 2023-04-01 ENCOUNTER — Encounter: Payer: Self-pay | Admitting: Hematology

## 2023-04-06 ENCOUNTER — Ambulatory Visit: Payer: Medicare Other | Attending: Cardiology

## 2023-04-06 DIAGNOSIS — I25118 Atherosclerotic heart disease of native coronary artery with other forms of angina pectoris: Secondary | ICD-10-CM

## 2023-04-06 LAB — ECHOCARDIOGRAM COMPLETE
AV Mean grad: 4 mm[Hg]
AV Peak grad: 7.4 mm[Hg]
Ao pk vel: 1.36 m/s
Area-P 1/2: 4.39 cm2
Calc EF: 36.8 %
S' Lateral: 4.2 cm
Single Plane A2C EF: 35.6 %
Single Plane A4C EF: 37.8 %

## 2023-04-06 MED ORDER — PERFLUTREN LIPID MICROSPHERE
1.0000 mL | INTRAVENOUS | Status: AC | PRN
  Administered 2023-04-06: 3 mL via INTRAVENOUS

## 2023-04-06 NOTE — Assessment & Plan Note (Signed)
 [id number], stage IA, G2, HER2 positive, ER positive, PR negative, HER2+, and DCIS(+) It was discovered on screening mammogram, initial biopsy showed DCIS only.   -Status post a right lumpectomy.  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 Margins are negative for invasive cancer, but DCIS margin was close. No lymphovascular or perineural invasion. Lymph node biopsy was not performed, Dr. Dwain Sarna has ordered an ultrasound of the right axilla for further evaluation.  If ultrasound is negative, I think it is okay not to have sentinel lymph node biopsy given the small primary tumor. -Given the aggressive nature of her HER2 positive disease, and moderate risk of recurrence, I do recommend adjuvant chemotherapy with Taxol or Abraxane once weekly for 12 weeks. -she underwent port placement and right axillary SLN biopsy on 01/12/23 and all 3 nodes were negative   -She started to weekly paclitaxel and trastuzumab on February 02, 2023, plan to continue Taxol for 3 months and trastuzumab for 1 year

## 2023-04-07 ENCOUNTER — Inpatient Hospital Stay: Payer: Medicare Other | Admitting: Hematology

## 2023-04-07 ENCOUNTER — Encounter: Payer: Self-pay | Admitting: Hematology

## 2023-04-07 ENCOUNTER — Inpatient Hospital Stay: Payer: Medicare Other

## 2023-04-07 VITALS — BP 136/73 | HR 95 | Temp 97.0°F | Resp 17 | Wt 181.7 lb

## 2023-04-07 VITALS — BP 144/84 | HR 99 | Resp 16

## 2023-04-07 DIAGNOSIS — Z170 Estrogen receptor positive status [ER+]: Secondary | ICD-10-CM

## 2023-04-07 DIAGNOSIS — Z95828 Presence of other vascular implants and grafts: Secondary | ICD-10-CM

## 2023-04-07 DIAGNOSIS — C50411 Malignant neoplasm of upper-outer quadrant of right female breast: Secondary | ICD-10-CM | POA: Diagnosis not present

## 2023-04-07 DIAGNOSIS — Z5112 Encounter for antineoplastic immunotherapy: Secondary | ICD-10-CM | POA: Diagnosis not present

## 2023-04-07 LAB — CMP (CANCER CENTER ONLY)
ALT: 14 U/L (ref 0–44)
AST: 12 U/L — ABNORMAL LOW (ref 15–41)
Albumin: 4.5 g/dL (ref 3.5–5.0)
Alkaline Phosphatase: 64 U/L (ref 38–126)
Anion gap: 7 (ref 5–15)
BUN: 9 mg/dL (ref 8–23)
CO2: 29 mmol/L (ref 22–32)
Calcium: 10 mg/dL (ref 8.9–10.3)
Chloride: 98 mmol/L (ref 98–111)
Creatinine: 0.52 mg/dL (ref 0.44–1.00)
GFR, Estimated: >60 mL/min (ref >60)
Glucose, Bld: 187 mg/dL — ABNORMAL HIGH (ref 70–99)
Potassium: 3.7 mmol/L (ref 3.5–5.1)
Sodium: 134 mmol/L — ABNORMAL LOW (ref 135–145)
Total Bilirubin: 0.5 mg/dL (ref 0.0–1.2)
Total Protein: 7.1 g/dL (ref 6.5–8.1)

## 2023-04-07 LAB — CBC WITH DIFFERENTIAL (CANCER CENTER ONLY)
Abs Immature Granulocytes: 0.08 10*3/uL — ABNORMAL HIGH (ref 0.00–0.07)
Basophils Absolute: 0.1 10*3/uL (ref 0.0–0.1)
Basophils Relative: 2 %
Eosinophils Absolute: 0.1 10*3/uL (ref 0.0–0.5)
Eosinophils Relative: 3 %
HCT: 35.3 % — ABNORMAL LOW (ref 36.0–46.0)
Hemoglobin: 12 g/dL (ref 12.0–15.0)
Immature Granulocytes: 2 %
Lymphocytes Relative: 28 %
Lymphs Abs: 1.2 10*3/uL (ref 0.7–4.0)
MCH: 29.5 pg (ref 26.0–34.0)
MCHC: 34 g/dL (ref 30.0–36.0)
MCV: 86.7 fL (ref 80.0–100.0)
Monocytes Absolute: 0.3 10*3/uL (ref 0.1–1.0)
Monocytes Relative: 8 %
Neutro Abs: 2.7 10*3/uL (ref 1.7–7.7)
Neutrophils Relative %: 57 %
Platelet Count: 376 10*3/uL (ref 150–400)
RBC: 4.07 MIL/uL (ref 3.87–5.11)
RDW: 14.8 % (ref 11.5–15.5)
WBC Count: 4.5 10*3/uL (ref 4.0–10.5)
nRBC: 0 % (ref 0.0–0.2)

## 2023-04-07 MED ORDER — TRASTUZUMAB-ANNS CHEMO 150 MG IV SOLR
2.0000 mg/kg | Freq: Once | INTRAVENOUS | Status: AC
  Administered 2023-04-07: 150 mg via INTRAVENOUS
  Filled 2023-04-07: qty 7.14

## 2023-04-07 MED ORDER — DIPHENHYDRAMINE HCL 50 MG/ML IJ SOLN
25.0000 mg | Freq: Once | INTRAMUSCULAR | Status: AC
  Administered 2023-04-07: 25 mg via INTRAVENOUS
  Filled 2023-04-07: qty 1

## 2023-04-07 MED ORDER — PACLITAXEL PROTEIN-BOUND CHEMO INJECTION 100 MG
80.0000 mg/m2 | Freq: Once | INTRAVENOUS | Status: AC
Start: 2023-04-07 — End: 2023-04-07
  Administered 2023-04-07: 150 mg via INTRAVENOUS
  Filled 2023-04-07: qty 30

## 2023-04-07 MED ORDER — SODIUM CHLORIDE 0.9% FLUSH
10.0000 mL | INTRAVENOUS | Status: DC | PRN
  Administered 2023-04-07: 10 mL

## 2023-04-07 MED ORDER — SODIUM CHLORIDE 0.9 % IV SOLN: Freq: Once | INTRAVENOUS | Status: AC

## 2023-04-07 MED ORDER — HEPARIN SOD (PORK) LOCK FLUSH 100 UNIT/ML IV SOLN
500.0000 [IU] | Freq: Once | INTRAVENOUS | Status: AC | PRN
Start: 2023-04-07 — End: 2023-04-07
  Administered 2023-04-07: 500 [IU]

## 2023-04-07 MED ORDER — SODIUM CHLORIDE 0.9% FLUSH
10.0000 mL | Freq: Once | INTRAVENOUS | Status: AC
  Administered 2023-04-07: 10 mL

## 2023-04-07 MED ORDER — ACETAMINOPHEN 325 MG PO TABS
650.0000 mg | ORAL_TABLET | Freq: Once | ORAL | Status: AC
  Administered 2023-04-07: 650 mg via ORAL
  Filled 2023-04-07: qty 2

## 2023-04-07 MED ORDER — PROCHLORPERAZINE MALEATE 10 MG PO TABS
10.0000 mg | ORAL_TABLET | Freq: Once | ORAL | Status: AC
  Administered 2023-04-07: 10 mg via ORAL
  Filled 2023-04-07: qty 1

## 2023-04-07 NOTE — Progress Notes (Signed)
 [facility]   Telephone:([phone] Fax:(336) [phone]   Clinic Follow up Note   Patient Care Team: Tisovec, [name], MD as PCP - General (Internal Medicine) [name], MD as PCP - Cardiology (Cardiology) [name], MD as Consulting Physician (Hematology) [name], MD as Consulting Physician (General Surgery) [name], MD as Consulting Physician (Radiation Oncology) [name], RN as Oncology Nurse Navigator [name], RN as Oncology Nurse Navigator  Date of Service:  04/07/2023  CHIEF COMPLAINT: f/u of right breast cancer  CURRENT THERAPY:  Adjuvant tamoxifen and trastuzumab   Oncology History   Malignant neoplasm of upper-outer quadrant of right breast in female, estrogen receptor positive (HCC) pT1aN0M0, stage IA, G2, HER2 positive, ER positive, PR negative, HER2+, and DCIS(+) It was discovered on screening mammogram, initial biopsy showed DCIS only.   -Status post a right lumpectomy.  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 Margins are negative for invasive cancer, but DCIS margin was close. No lymphovascular or perineural invasion. Lymph node biopsy was not performed, Dr. Ebbie has ordered an ultrasound of the right axilla for further evaluation.  If ultrasound is negative, I think it is okay not to have sentinel lymph node biopsy given the small primary tumor. -Given the aggressive nature of her HER2 positive disease, and moderate risk of recurrence, I do recommend adjuvant chemotherapy with Taxol  or Abraxane  once weekly for 12 weeks. -she underwent port placement and right axillary SLN biopsy on 01/12/23 and all 3 nodes were negative   -She started to weekly paclitaxel  and trastuzumab  on February 02, 2023, plan to continue Taxol  for 3 months and trastuzumab  for 1 year    Assessment and Plan    Breast Cancer 68 year old  68 undergoing tenth chemotherapy session for breast cancer. Reports minimal alopecia, occasional nausea managed with antiemetics, and infrequent diarrhea managed with loperamide. Tolerating treatment well. Plan includes transitioning to antibody therapy every three weeks post-chemotherapy, followed by radiation therapy. Discussed risks (alopecia, nausea, diarrhea) and benefits (tumor reduction, potential remission). Anticipated positive outcome with continued monitoring. Discussed convenience of receiving antibody therapy at [facility] during radiation therapy. - Continue current chemotherapy regimen - Transition to antibody therapy every three weeks post-chemotherapy - Schedule radiation therapy at [place] post-chemotherapy - Coordinate with radiation oncologist Dr. Camelia for radiation therapy schedule - Discuss possibility of receiving antibody therapy at [facility] during radiation therapy  Cardiac Monitoring Abnormal heart rhythms managed with metoprolol  and aspirin . Recent echocardiogram showed decreased left ventricular function from 50-55% to 45-50%, likely due to antibody therapy. Cardiologist to monitor and possibly adjust medications. Discussed risks of decreased cardiac function and benefits of continued cancer treatment. Anticipated stable cardiac function with close monitoring. - Continue metoprolol  and aspirin  - Consult cardiologist Dr. Armin for further evaluation and potential adjustment of heart medications - Monitor cardiac function closely during ongoing treatment  General Health Maintenance Managing well with current medications, no new complaints. Refilled lidocaine  prescription for antibody therapy, sufficient antiemetics on hand. - Ensure adequate supply of lidocaine , ondansetron , and prochlorperazine   Plan -Echocardiogram from yesterday reviewed, slightly decreased EF, okay to continue trastuzumab , she will follow-up with cardiologist closely -Lab reviewed, adequate for  treatment, will proceed to chemo and trastuzumab  today, he has 2 more weeks chemo to complete - Schedule follow-up appointment on April 21, 2023 -Will refer her to [facility] radiation oncology on next visit      SUMMARY  OF ONCOLOGIC HISTORY: Oncology History  Malignant neoplasm of upper-outer quadrant of right breast in female, estrogen receptor positive (HCC)  10/27/2022 Cancer Staging   Staging form: Breast, AJCC 8th Edition - Clinical stage from 10/27/2022: Stage 0 (cTis (DCIS), cN0, cM0, G3, ER+, PR-, HER2: Not Assessed) - Signed by [name], MD on 11/03/2022 Stage prefix: Initial diagnosis Histologic grading system: 3 grade system   11/01/2022 Initial Diagnosis   Ductal carcinoma in situ (DCIS) of right breast   11/09/2022 Genetic Testing   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  No other deleterious variants in [employer] Common Hereditary Cancers +RNA Panel. Report date is 11/09/2022.    The [employer] Common Hereditary Cancers + RNA Panel includes sequencing, deletion/duplication, and RNA analysis of the following 48 genes: APC, ATM, AXIN2, BAP1, BARD1, BMPR1A, BRCA1, BRCA2, BRIP1, CDH1, CDK4*, CDKN2A*, CHEK2, CTNNA1, DICER1, EPCAM* (del/dup only), FH, GREM1* (promoter dup analysis only), HOXB13*, KIT*, MBD4*, MEN1, MLH1, MSH2, MSH3, MSH6, MUTYH, NF1, NTHL1, PALB2, PDGFRA*, PMS2, POLD1, POLE, PTEN, RAD51C, RAD51D, SDHA (sequencing only), SDHB, SDHC, SDHD, SMAD4, SMARCA4, STK11, TP53, TSC1, TSC2, VHL.  *Genes without RNA analysis.    12/07/2022 Cancer Staging   Staging form: Breast, AJCC 8th Edition - Pathologic stage from 12/07/2022: Stage IA (pT1a, pN0, cM0, G2, ER+, PR-, HER2+) - Signed by [name], MD on 12/20/2022 Stage prefix: Initial diagnosis Histologic grading system: 3 grade system Residual tumor (R): R0 - None   02/02/2023 -  Chemotherapy   Patient is on Treatment Plan : BREAST Abraxane  + Trastuzumab  q7d / Trastuzumab  q21d        Discussed the use of  AI scribe software for clinical note transcription with the patient, who gave verbal consent to proceed.  History of Present Illness   [name], a 68 year old female with a history of breast cancer, presents for a follow-up visit. She is currently undergoing chemotherapy and has completed ten sessions so far. She reports minimal hair loss, which she attributes to not washing her hair daily. She has prepared for potential hair loss by acquiring wigs but has not needed to use them yet.  Regarding side effects from the chemotherapy, she reports occasional nausea and has taken anti-nausea medication a couple of times. She also mentions experiencing diarrhea about three times, for which she took Imodium. However, she generally feels well and has not had any significant problems.  Aizlynn also discusses a recent echocardiogram, which she had due to her history of heart issues. She has been on metoprolol  and a daily 81mg  aspirin  since 2020 following an episode of abnormal heartbeat. The recent echocardiogram report indicated a decrease in her left ventricle function, which she is unsure of the significance.         All other systems were reviewed with the patient and are negative.  MEDICAL HISTORY:  Past Medical History:  Diagnosis Date   Anemia    Arthritis    Atypical chest pain    a. 05/2018 CTA chest: No PE; b. 05/2018 MV: No ischemia/scar. EF 65%.   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 05/2018 CT Chest: Coronary and Ao atherosclerotic Calcifications.   Diabetes mellitus without complication (HCC)    Diastolic dysfunction    a. 05/2018 Echo: EF 60-65%, impaired relaxation. Nl RVSP.   Dysrhythmia    PVC's   Family history of adverse reaction to  anesthesia    sister-malignant hyperthermia   FH of Malignant hyperthermia    a. sister had malginant hyperthermia   History of kidney stones     History of renal insufficiency    History of skin cancer    Hyperlipemia    Osteoporosis    Pulmonary nodules    a. 05/2018 CT Chest: Small pulm nodules measuring up to 6mm avg diameter. Rec non-contrast Chest CT in 6-12 mos.   Thyroid  nodule     SURGICAL HISTORY: Past Surgical History:  Procedure Laterality Date   AXILLARY LYMPH NODE BIOPSY Right 01/12/2023   Procedure: RIGHT AXILLARY SENTINEL NODE BIOPSY;  Surgeon: [name], MD;  Location: [facility] OR;  Service: General;  Laterality: Right;  LMA   BREAST BIOPSY Right 10/27/2022   MM RT BREAST BX W LOC DEV 1ST LESION IMAGE BX SPEC STEREO GUIDE 10/27/2022 [facility]MO[facility]   BREAST BIOPSY  12/06/2022   MM RT RADIOACTIVE SEED LOC MAMMO GUIDE 12/06/2022 [facility]MO[facility]   BREAST LUMPECTOMY WITH RADIOACTIVE SEED LOCALIZATION Right 12/07/2022   Procedure: RIGHT BREAST LUMPECTOMY WITH RADIOACTIVE SEED LOCALIZATION;  Surgeon: [name], MD;  Location: [facility] OR;  Service: General;  Laterality: Right;   CESAREAN SECTION     CHOLECYSTECTOMY  2006   lap choli with umb hernia   COLONOSCOPY     HYSTEROSCOPY WITH D & C N/A 12/07/2022   Procedure: DILATATION AND CURETTAGE /HYSTEROSCOPY WITH POLYPECTOMY ENDOMETRIAL BIOPSY;  Surgeon: Okey Leader, MD;  Location: [facility] OR;  Service: Gynecology;  Laterality: N/A;   LIPOMA EXCISION Left 03/15/2014   Procedure: EXCISION LIPOMA LEFT LOWER QUARDRANT ABDOMINAL WALL;  Surgeon: Dann Hummer, MD;  Location: [facility];  Service: General;  Laterality: Left;   PORTACATH PLACEMENT N/A 01/12/2023   Procedure: PORT PLACEMENT WITH ULTRASOUND GUIDANCE;  Surgeon: [name], MD;  Location: [facility] OR;  Service: General;  Laterality: N/A;   THYROIDECTOMY N/A 06/25/2019   Procedure: TOTAL THYROIDECTOMY;  Surgeon: Eletha Boas, MD;  Location: [facility];  Service: General;  Laterality: N/A;   TONSILLECTOMY     TRIGGER FINGER RELEASE  2012   lt small,middle,long   TRIGGER FINGER RELEASE Right  07/14/2017   Procedure: RELEASE TRIGGER FINGER/A-1 PULLEY RIGHT THUMB;  Surgeon: Murrell Kuba, MD;  Location:  SURGERY CENTER;  Service: Orthopedics;  Laterality: Right;   UMBILICAL HERNIA REPAIR  2006   with lap choli    I have reviewed the social history and family history with the patient and they are unchanged from previous note.  ALLERGIES:  has no known allergies.  MEDICATIONS:  Current Outpatient Medications  Medication Sig Dispense Refill   aspirin  EC 81 MG tablet Take 81 mg by mouth daily.     ciprofloxacin  (CIPRO ) 500 MG tablet Take 1 tablet (500 mg total) by mouth 2 (two) times daily. (Patient not taking: Reported on 03/25/2023) 14 tablet 0   Insulin  Glargine (BASAGLAR  KWIKPEN) 100 UNIT/ML SOPN Inject 35 Units into the skin at bedtime.     insulin  lispro (HUMALOG) 100 UNIT/ML injection Inject 10-15 Units into the skin 3 (three) times daily before meals. Sliding scale     levothyroxine  (SYNTHROID ) 112 MCG tablet Take 112 mcg by mouth daily before breakfast.     lidocaine -prilocaine  (EMLA ) cream Apply to affected area once 30 g 3   metFORMIN  (GLUCOPHAGE ) 1000 MG tablet Take 1,000 mg by mouth 2 (two) times daily with a meal.     metoprolol  succinate (TOPROL -XL) 25 MG 24 hr tablet TAKE  1 TABLET (25 MG TOTAL) BY MOUTH DAILY. 90 tablet 0   nitrofurantoin  (MACRODANTIN ) 100 MG capsule Take 100 mg by mouth daily.     Nutritional Supplements (JUICE PLUS FIBRE PO) Take 3 tablets by mouth daily. Juice Plus Vegetable     Nutritional Supplements (JUICE PLUS FIBRE PO) Take 3 tablets by mouth daily. Juice Plus Fruit     ondansetron  (ZOFRAN ) 8 MG tablet Take 1 tablet (8 mg total) by mouth every 8 (eight) hours as needed for nausea or vomiting. 30 tablet 1   prochlorperazine  (COMPAZINE ) 10 MG tablet Take 1 tablet (10 mg total) by mouth every 6 (six) hours as needed for nausea or vomiting. 30 tablet 1   ramipril  (ALTACE ) 5 MG capsule Take 5 mg by mouth daily with breakfast.      rosuvastatin  (CRESTOR ) 20 MG tablet Take 20 mg by mouth daily with supper.      triamcinolone  cream (KENALOG ) 0.1 % Apply 1 Application topically daily as needed (dermatitis).     No current facility-administered medications for this visit.   Facility-Administered Medications Ordered in Other Visits  Medication Dose Route Frequency Provider Last Rate Last Admin   sodium chloride  flush (NS) 0.9 % injection 10 mL  10 mL Intracatheter PRN [name], MD   10 mL at 04/07/23 1535    PHYSICAL EXAMINATION: ECOG PERFORMANCE STATUS: 1 - Symptomatic but completely ambulatory  Vitals:   04/07/23 1145  BP: 136/73  Pulse: 95  Resp: 17  Temp: (!) 97 F (36.1 C)  SpO2: 98%   Wt Readings from Last 3 Encounters:  04/07/23 181 lb 11.2 oz (82.4 kg)  03/31/23 181 lb (82.1 kg)  03/25/23 179 lb 6.4 oz (81.4 kg)     GENERAL:alert, no distress and comfortable SKIN: skin color, texture, turgor are normal, no rashes or significant lesions EYES: normal, Conjunctiva are pink and non-injected, sclera clear NECK: supple, thyroid  normal size, non-tender, without nodularity LYMPH:  no palpable lymphadenopathy in the cervical, axillary  LUNGS: clear to auscultation and percussion with normal breathing effort HEART: regular rate & rhythm and no murmurs and no lower extremity edema ABDOMEN:abdomen soft, non-tender and normal bowel sounds Musculoskeletal:no cyanosis of digits and no clubbing  NEURO: alert & oriented x 3 with fluent speech, no focal motor/sensory deficits   LABORATORY DATA:  I have reviewed the data as listed    Latest Ref Rng & Units 04/07/2023   11:26 AM 03/31/2023    1:24 PM 03/25/2023   12:58 PM  CBC  WBC 4.0 - 10.5 K/uL 4.5  5.0  5.1   Hemoglobin 12.0 - 15.0 g/dL 87.9  88.4  88.2   Hematocrit 36.0 - 46.0 % 35.3  33.3  33.5   Platelets 150 - 400 K/uL 376  386  394         Latest Ref Rng & Units 04/07/2023   11:26 AM 03/31/2023    1:24 PM 03/25/2023   12:58 PM  CMP  Glucose 70 - 99  mg/dL 812  820  85   BUN 8 - 23 mg/dL 9  9  8    Creatinine 0.44 - 1.00 mg/dL 9.47  9.40  9.47   Sodium 135 - 145 mmol/L 134  136  138   Potassium 3.5 - 5.1 mmol/L 3.7  3.8  3.4   Chloride 98 - 111 mmol/L 98  101  103   CO2 22 - 32 mmol/L 29  28  29    Calcium  8.9 - 10.3  mg/dL 89.9  89.9  9.6   Total Protein 6.5 - 8.1 g/dL 7.1  6.8  6.8   Total Bilirubin 0.0 - 1.2 mg/dL 0.5  0.5  0.5   Alkaline Phos 38 - 126 U/L 64  58  53   AST 15 - 41 U/L 12  11  11    ALT 0 - 44 U/L 14  13  12        RADIOGRAPHIC STUDIES: I have personally reviewed the radiological images as listed and agreed with the findings in the report. ECHOCARDIOGRAM COMPLETE Result Date: 04/06/2023    ECHOCARDIOGRAM REPORT   Patient Name:   [name] Date of Exam: 04/06/2023 Medical Rec #:  [id number]       Height:       66.0 in Accession #:    [id number]      Weight:       181.0 lb Date of Birth:  22-Jan-1956       BSA:          1.917 m Patient Age:    67 years        BP:           148/70 mmHg Patient Gender: F               HR:           83 bpm. Exam Location:  [place] Procedure: 2D Echo, Cardiac Doppler, Color Doppler and Intracardiac            Opacification Agent Indications:    Codes Comments; CHEK2 gene mutation positive - Primary Z15.89 ;                 Z13.79 ; Ductal carcinoma in situ (DCIS) of right breast D05.11                 ; ;  History:        Patient has prior history of Echocardiogram examinations, most                 recent 12/26/2022. CAD, Signs/Symptoms:Chest Pain; Risk                 Factors:Non-Smoker, Hypertension, Diabetes and Dyslipidemia.  Sonographer:    Doyal Point MHA, BS, RDCS Referring Phys: [phone] SHERI HAMMOCK  Sonographer Comments: EF is lower than last echo report of 50-55% IMPRESSIONS  1. Left ventricular ejection fraction, by estimation, is 45 to 50%. Left ventricular ejection fraction by PLAX is 49 %. The left ventricle has moderately decreased function. The left ventricle demonstrates  global hypokinesis. Left ventricular diastolic parameters are consistent with Grade I diastolic dysfunction (impaired relaxation).  2. Right ventricular systolic function is normal. The right ventricular size is normal. Tricuspid regurgitation signal is inadequate for assessing PA pressure.  3. The mitral valve is normal in structure. Mild mitral valve regurgitation. No evidence of mitral stenosis.  4. The aortic valve is normal in structure. There is mild calcification of the aortic valve. Aortic valve regurgitation is not visualized. Aortic valve sclerosis is present, with no evidence of aortic valve stenosis.  5. There is mild dilatation of the ascending aorta, measuring 40 mm.  6. The inferior vena cava is normal in size with greater than 50% respiratory variability, suggesting right atrial pressure of 3 mmHg. FINDINGS  Left Ventricle: Left ventricular ejection fraction, by estimation, is 45 to 50%. Left ventricular ejection fraction by PLAX is 49 %. The left ventricle has moderately decreased function.  The left ventricle demonstrates global hypokinesis. Definity  contrast agent was given IV to delineate the left ventricular endocardial borders. The left ventricular internal cavity size was normal in size. There is no left ventricular hypertrophy. Left ventricular diastolic parameters are consistent with Grade I diastolic dysfunction (impaired relaxation). Right Ventricle: The right ventricular size is normal. No increase in right ventricular wall thickness. Right ventricular systolic function is normal. Tricuspid regurgitation signal is inadequate for assessing PA pressure. Left Atrium: Left atrial size was normal in size. Right Atrium: Right atrial size was normal in size. Pericardium: There is no evidence of pericardial effusion. Mitral Valve: The mitral valve is normal in structure. Mild mitral valve regurgitation. No evidence of mitral valve stenosis. Tricuspid Valve: The tricuspid valve is normal in  structure. Tricuspid valve regurgitation is mild . No evidence of tricuspid stenosis. Aortic Valve: The aortic valve is normal in structure. There is mild calcification of the aortic valve. Aortic valve regurgitation is not visualized. Aortic valve sclerosis is present, with no evidence of aortic valve stenosis. Aortic valve mean gradient  measures 4.0 mmHg. Aortic valve peak gradient measures 7.4 mmHg. Pulmonic Valve: The pulmonic valve was normal in structure. Pulmonic valve regurgitation is not visualized. No evidence of pulmonic stenosis. Aorta: The aortic root is normal in size and structure. There is mild dilatation of the ascending aorta, measuring 40 mm. Venous: The inferior vena cava is normal in size with greater than 50% respiratory variability, suggesting right atrial pressure of 3 mmHg. IAS/Shunts: No atrial level shunt detected by color flow Doppler.  LEFT VENTRICLE PLAX 2D LV EF:         Left            Diastology                ventricular     LV e' medial:    4.24 cm/s                ejection        LV E/e' medial:  17.9                fraction by     LV e' lateral:   6.74 cm/s                PLAX is 49      LV E/e' lateral: 11.3                %. LVIDd:         5.60 cm LVIDs:         4.20 cm LV PW:         0.90 cm LV IVS:        0.80 cm  LV Volumes (MOD) LV vol d, MOD    102.0 ml A2C: LV vol d, MOD    107.0 ml A4C: LV vol s, MOD    65.7 ml A2C: LV vol s, MOD    66.6 ml A4C: LV SV MOD A2C:   36.3 ml LV SV MOD A4C:   107.0 ml LV SV MOD BP:    39.1 ml RIGHT VENTRICLE RV Basal diam:  3.40 cm RV Mid diam:    2.30 cm RV S prime:     14.10 cm/s TAPSE (M-mode): 2.2 cm LEFT ATRIUM             Index        RIGHT ATRIUM           Index  LA diam:        3.80 cm 1.98 cm/m   RA Area:     11.60 cm LA Vol (A2C):   51.8 ml 27.03 ml/m  RA Volume:   21.80 ml  11.37 ml/m LA Vol (A4C):   24.7 ml 12.89 ml/m LA Biplane Vol: 35.8 ml 18.68 ml/m  AORTIC VALVE AV Vmax:           136.00 cm/s AV Vmean:          89.400  cm/s AV VTI:            0.251 m AV Peak Grad:      7.4 mmHg AV Mean Grad:      4.0 mmHg LVOT Vmax:         77.00 cm/s LVOT Vmean:        51.400 cm/s LVOT VTI:          0.144 m LVOT/AV VTI ratio: 0.57  AORTA Ao Root diam: 3.30 cm Ao Asc diam:  4.00 cm MITRAL VALVE MV Area (PHT): 4.39 cm     SHUNTS MV Decel Time: 173 msec     Systemic VTI: 0.14 m MV E velocity: 76.00 cm/s MV A velocity: 102.00 cm/s MV E/A ratio:  0.75 Evalene Lunger MD Electronically signed by Evalene Lunger MD Signature Date/Time: 04/06/2023/5:57:06 PM    Final       No orders of the defined types were placed in this encounter.  All questions were answered. The patient knows to call the clinic with any problems, questions or concerns. No barriers to learning was detected. The total time spent in the appointment was 30 minutes.     Patricia Mattock, MD 04/07/2023

## 2023-04-07 NOTE — Progress Notes (Signed)
 Heart squeeze was noted to be 45-50% which is a drop from prior study of 50-55%.  This is considered moderately decreased function.  There is also some stiffness noted to the muscle likely related to aging and/or hypertension.  Mild ligament was noted in the mitral valve.  The aortic valve was noted to have calcification with mild narrowing of the opening.  There is mild dilatation of the ascending aorta measuring 40 mm.  This will require surveillance studies.

## 2023-04-07 NOTE — Patient Instructions (Signed)
 [facility] - A DEPT OF [place] [facility]  Discharge Instructions: Thank you for choosing [facility] to provide your oncology and hematology care.   If you have a lab appointment with the Cancer Center, please go directly to the Cancer Center and check in at the registration area.   Wear comfortable clothing and clothing appropriate for easy access to any Portacath or PICC line.   We strive to give you quality time with your provider. You may need to reschedule your appointment if you arrive late (15 or more minutes).  Arriving late affects you and other patients whose appointments are after yours.  Also, if you miss three or more appointments without notifying the office, you may be dismissed from the clinic at the provider's discretion.      For prescription refill requests, have your pharmacy contact our office and allow 72 hours for refills to be completed.    Today you received the following chemotherapy and/or immunotherapy agents: Kanjinti /Abraxane       To help prevent nausea and vomiting after your treatment, we encourage you to take your nausea medication as directed.  BELOW ARE SYMPTOMS THAT SHOULD BE REPORTED IMMEDIATELY: *FEVER GREATER THAN 100.4 F (38 C) OR HIGHER *CHILLS OR SWEATING *NAUSEA AND VOMITING THAT IS NOT CONTROLLED WITH YOUR NAUSEA MEDICATION *UNUSUAL SHORTNESS OF BREATH *UNUSUAL BRUISING OR BLEEDING *URINARY PROBLEMS (pain or burning when urinating, or frequent urination) *BOWEL PROBLEMS (unusual diarrhea, constipation, pain near the anus) TENDERNESS IN MOUTH AND THROAT WITH OR WITHOUT PRESENCE OF ULCERS (sore throat, sores in mouth, or a toothache) UNUSUAL RASH, SWELLING OR PAIN  UNUSUAL VAGINAL DISCHARGE OR ITCHING   Items with * indicate a potential emergency and should be followed up as soon as possible or go to the Emergency Department if any problems should occur.  Please show the CHEMOTHERAPY ALERT CARD or  IMMUNOTHERAPY ALERT CARD at check-in to the Emergency Department and triage nurse.  Should you have questions after your visit or need to cancel or reschedule your appointment, please contact [facility] - A DEPT [facility]  Dept: [phone]  and follow the prompts.  Office hours are 8:00 a.m. to 4:30 p.m. Monday - Friday. Please note that voicemails left after 4:00 p.m. may not be returned until the following business day.  We are closed weekends and major holidays. You have access to a nurse at all times for urgent questions. Please call the main number to the clinic Dept: [phone] and follow the prompts.   For any non-urgent questions, you may also contact your provider using MyChart. We now offer e-Visits for anyone 51 and older to request care online for non-urgent symptoms. For details visit [email or web address].   Also download the MyChart app! Go to the app store, search MyChart, open the app, select [place], and log in with your MyChart username and password.

## 2023-04-13 ENCOUNTER — Other Ambulatory Visit: Payer: Self-pay

## 2023-04-13 ENCOUNTER — Inpatient Hospital Stay: Payer: Medicare Other

## 2023-04-13 VITALS — BP 143/88 | HR 100 | Temp 98.1°F | Resp 17 | Wt 181.2 lb

## 2023-04-13 DIAGNOSIS — Z5112 Encounter for antineoplastic immunotherapy: Secondary | ICD-10-CM | POA: Diagnosis not present

## 2023-04-13 DIAGNOSIS — Z170 Estrogen receptor positive status [ER+]: Secondary | ICD-10-CM

## 2023-04-13 LAB — CBC WITH DIFFERENTIAL (CANCER CENTER ONLY)
Abs Immature Granulocytes: 0.06 10*3/uL (ref 0.00–0.07)
Basophils Absolute: 0.1 10*3/uL (ref 0.0–0.1)
Basophils Relative: 2 %
Eosinophils Absolute: 0.2 10*3/uL (ref 0.0–0.5)
Eosinophils Relative: 3 %
HCT: 35.1 % — ABNORMAL LOW (ref 36.0–46.0)
Hemoglobin: 12 g/dL (ref 12.0–15.0)
Immature Granulocytes: 1 %
Lymphocytes Relative: 24 %
Lymphs Abs: 1.4 10*3/uL (ref 0.7–4.0)
MCH: 29.3 pg (ref 26.0–34.0)
MCHC: 34.2 g/dL (ref 30.0–36.0)
MCV: 85.8 fL (ref 80.0–100.0)
Monocytes Absolute: 0.3 10*3/uL (ref 0.1–1.0)
Monocytes Relative: 5 %
Neutro Abs: 3.8 10*3/uL (ref 1.7–7.7)
Neutrophils Relative %: 65 %
Platelet Count: 385 10*3/uL (ref 150–400)
RBC: 4.09 MIL/uL (ref 3.87–5.11)
RDW: 14.6 % (ref 11.5–15.5)
WBC Count: 5.8 10*3/uL (ref 4.0–10.5)
nRBC: 0 % (ref 0.0–0.2)

## 2023-04-13 LAB — CMP (CANCER CENTER ONLY)
ALT: 12 U/L (ref 0–44)
AST: 11 U/L — ABNORMAL LOW (ref 15–41)
Albumin: 4.3 g/dL (ref 3.5–5.0)
Alkaline Phosphatase: 61 U/L (ref 38–126)
Anion gap: 7 (ref 5–15)
BUN: 8 mg/dL (ref 8–23)
CO2: 28 mmol/L (ref 22–32)
Calcium: 9.7 mg/dL (ref 8.9–10.3)
Chloride: 101 mmol/L (ref 98–111)
Creatinine: 0.57 mg/dL (ref 0.44–1.00)
GFR, Estimated: >60 mL/min (ref >60)
Glucose, Bld: 165 mg/dL — ABNORMAL HIGH (ref 70–99)
Potassium: 3.6 mmol/L (ref 3.5–5.1)
Sodium: 136 mmol/L (ref 135–145)
Total Bilirubin: 0.5 mg/dL (ref 0.0–1.2)
Total Protein: 6.8 g/dL (ref 6.5–8.1)

## 2023-04-13 MED ORDER — TRASTUZUMAB-ANNS CHEMO 150 MG IV SOLR
2.0000 mg/kg | Freq: Once | INTRAVENOUS | Status: AC
  Administered 2023-04-13: 150 mg via INTRAVENOUS
  Filled 2023-04-13: qty 7.14

## 2023-04-13 MED ORDER — SODIUM CHLORIDE 0.9% FLUSH
10.0000 mL | INTRAVENOUS | Status: DC | PRN
  Administered 2023-04-13: 10 mL

## 2023-04-13 MED ORDER — HEPARIN SOD (PORK) LOCK FLUSH 100 UNIT/ML IV SOLN
500.0000 [IU] | Freq: Once | INTRAVENOUS | Status: AC | PRN
  Administered 2023-04-13: 500 [IU]

## 2023-04-13 MED ORDER — PROCHLORPERAZINE MALEATE 10 MG PO TABS
10.0000 mg | ORAL_TABLET | Freq: Once | ORAL | Status: AC
  Administered 2023-04-13: 10 mg via ORAL
  Filled 2023-04-13: qty 1

## 2023-04-13 MED ORDER — ACETAMINOPHEN 325 MG PO TABS
650.0000 mg | ORAL_TABLET | Freq: Once | ORAL | Status: AC
  Administered 2023-04-13: 650 mg via ORAL
  Filled 2023-04-13: qty 2

## 2023-04-13 MED ORDER — PACLITAXEL PROTEIN-BOUND CHEMO INJECTION 100 MG
80.0000 mg/m2 | Freq: Once | INTRAVENOUS | Status: AC
Start: 2023-04-13 — End: 2023-04-13
  Administered 2023-04-13: 150 mg via INTRAVENOUS
  Filled 2023-04-13: qty 30

## 2023-04-13 MED ORDER — DIPHENHYDRAMINE HCL 50 MG/ML IJ SOLN
25.0000 mg | Freq: Once | INTRAMUSCULAR | Status: AC
  Administered 2023-04-13: 25 mg via INTRAVENOUS
  Filled 2023-04-13: qty 1

## 2023-04-13 MED ORDER — SODIUM CHLORIDE 0.9 % IV SOLN: Freq: Once | INTRAVENOUS | Status: AC

## 2023-04-13 NOTE — Patient Instructions (Signed)
 [facility] - A DEPT OF [facility]  Discharge Instructions: Thank you for choosing [facility] to provide your oncology and hematology care.   If you have a lab appointment with the Cancer Center, please go directly to the Cancer Center and check in at the registration area.   Wear comfortable clothing and clothing appropriate for easy access to any Portacath or PICC line.   We strive to give you quality time with your provider. You may need to reschedule your appointment if you arrive late (15 or more minutes).  Arriving late affects you and other patients whose appointments are after yours.  Also, if you miss three or more appointments without notifying the office, you may be dismissed from the clinic at the provider's discretion.      For prescription refill requests, have your pharmacy contact our office and allow 72 hours for refills to be completed.    Today you received the following chemotherapy and/or immunotherapy agents: Trastuzumab/Abraxane      To help prevent nausea and vomiting after your treatment, we encourage you to take your nausea medication as directed.  BELOW ARE SYMPTOMS THAT SHOULD BE REPORTED IMMEDIATELY: *FEVER GREATER THAN 100.4 F (38 C) OR HIGHER *CHILLS OR SWEATING *NAUSEA AND VOMITING THAT IS NOT CONTROLLED WITH YOUR NAUSEA MEDICATION *UNUSUAL SHORTNESS OF BREATH *UNUSUAL BRUISING OR BLEEDING *URINARY PROBLEMS (pain or burning when urinating, or frequent urination) *BOWEL PROBLEMS (unusual diarrhea, constipation, pain near the anus) TENDERNESS IN MOUTH AND THROAT WITH OR WITHOUT PRESENCE OF ULCERS (sore throat, sores in mouth, or a toothache) UNUSUAL RASH, SWELLING OR PAIN  UNUSUAL VAGINAL DISCHARGE OR ITCHING   Items with * indicate a potential emergency and should be followed up as soon as possible or go to the Emergency Department if any problems should occur.  Please show the CHEMOTHERAPY ALERT CARD or  IMMUNOTHERAPY ALERT CARD at check-in to the Emergency Department and triage nurse.  Should you have questions after your visit or need to cancel or reschedule your appointment, please contact [facility] - A DEPT OF [facility]  Dept: [phone]  and follow the prompts.  Office hours are 8:00 a.m. to 4:30 p.m. Monday - Friday. Please note that voicemails left after 4:00 p.m. may not be returned until the following business day.  We are closed weekends and major holidays. You have access to a nurse at all times for urgent questions. Please call the main number to the clinic Dept: [phone] and follow the prompts.   For any non-urgent questions, you may also contact your provider using MyChart. We now offer e-Visits for anyone 40 and older to request care online for non-urgent symptoms. For details visit [email or web address].   Also download the MyChart app! Go to the app store, search "MyChart", open the app, select [facility], and log in with your MyChart username and password.

## 2023-04-14 ENCOUNTER — Inpatient Hospital Stay: Payer: Medicare Other | Admitting: *Deleted

## 2023-04-14 DIAGNOSIS — Z170 Estrogen receptor positive status [ER+]: Secondary | ICD-10-CM

## 2023-04-14 DIAGNOSIS — Z9189 Other specified personal risk factors, not elsewhere classified: Secondary | ICD-10-CM

## 2023-04-14 NOTE — Progress Notes (Signed)
SUBJECTIVE: Pt returns for her 3 month L-Dex screen.  She is doing well and is not having any swelling or tightness.     PAIN:  Are you having pain? No   SOZO SCREENING: Patient was assessed today using the SOZO machine to determine the lymphedema index score. This was compared to her baseline score. It was determined that she is within the recommended range when compared to her baseline and no further action is needed at this time. She will continue SOZO screenings. These are done every 3 months for 2 years post operatively followed by every 6 months for 2 years, and then annually.     L-DEX FLOWSHEETS                L-DEX LYMPHEDEMA SCREENING    Measurement Type Unilateral     L-DEX MEASUREMENT EXTREMITY Upper Extremity     POSITION  Standing     DOMINANT SIDE Right     At Risk Side Right     BASELINE SCORE (UNILATERAL) 2.6    L-DEX SCORE (UNILATERAL) 3.1    VALUE CHANGE (UNILAT) 0.5

## 2023-04-15 ENCOUNTER — Other Ambulatory Visit: Payer: Self-pay

## 2023-04-21 ENCOUNTER — Encounter: Payer: Self-pay | Admitting: *Deleted

## 2023-04-21 ENCOUNTER — Inpatient Hospital Stay: Payer: Medicare Other

## 2023-04-21 ENCOUNTER — Encounter: Payer: Self-pay | Admitting: Hematology

## 2023-04-21 ENCOUNTER — Inpatient Hospital Stay: Payer: Medicare Other | Admitting: Hematology

## 2023-04-21 VITALS — BP 149/84 | HR 106 | Temp 97.0°F | Resp 16 | Ht 66.0 in | Wt 181.1 lb

## 2023-04-21 VITALS — HR 108

## 2023-04-21 DIAGNOSIS — C50411 Malignant neoplasm of upper-outer quadrant of right female breast: Secondary | ICD-10-CM

## 2023-04-21 DIAGNOSIS — Z95828 Presence of other vascular implants and grafts: Secondary | ICD-10-CM

## 2023-04-21 DIAGNOSIS — Z170 Estrogen receptor positive status [ER+]: Secondary | ICD-10-CM

## 2023-04-21 DIAGNOSIS — Z5112 Encounter for antineoplastic immunotherapy: Secondary | ICD-10-CM | POA: Diagnosis not present

## 2023-04-21 LAB — CMP (CANCER CENTER ONLY)
ALT: 12 U/L (ref 0–44)
AST: 11 U/L — ABNORMAL LOW (ref 15–41)
Albumin: 4.5 g/dL (ref 3.5–5.0)
Alkaline Phosphatase: 65 U/L (ref 38–126)
Anion gap: 8 (ref 5–15)
BUN: 12 mg/dL (ref 8–23)
CO2: 28 mmol/L (ref 22–32)
Calcium: 10 mg/dL (ref 8.9–10.3)
Chloride: 100 mmol/L (ref 98–111)
Creatinine: 0.56 mg/dL (ref 0.44–1.00)
GFR, Estimated: >60 mL/min (ref >60)
Glucose, Bld: 130 mg/dL — ABNORMAL HIGH (ref 70–99)
Potassium: 3.8 mmol/L (ref 3.5–5.1)
Sodium: 136 mmol/L (ref 135–145)
Total Bilirubin: 0.6 mg/dL (ref 0.0–1.2)
Total Protein: 7.4 g/dL (ref 6.5–8.1)

## 2023-04-21 LAB — CBC WITH DIFFERENTIAL (CANCER CENTER ONLY)
Abs Immature Granulocytes: 0.09 10*3/uL — ABNORMAL HIGH (ref 0.00–0.07)
Basophils Absolute: 0.1 10*3/uL (ref 0.0–0.1)
Basophils Relative: 2 %
Eosinophils Absolute: 0.2 10*3/uL (ref 0.0–0.5)
Eosinophils Relative: 3 %
HCT: 38.2 % (ref 36.0–46.0)
Hemoglobin: 12.8 g/dL (ref 12.0–15.0)
Immature Granulocytes: 2 %
Lymphocytes Relative: 34 %
Lymphs Abs: 1.5 10*3/uL (ref 0.7–4.0)
MCH: 29.6 pg (ref 26.0–34.0)
MCHC: 33.5 g/dL (ref 30.0–36.0)
MCV: 88.2 fL (ref 80.0–100.0)
Monocytes Absolute: 0.4 10*3/uL (ref 0.1–1.0)
Monocytes Relative: 8 %
Neutro Abs: 2.3 10*3/uL (ref 1.7–7.7)
Neutrophils Relative %: 51 %
Platelet Count: 443 10*3/uL — ABNORMAL HIGH (ref 150–400)
RBC: 4.33 MIL/uL (ref 3.87–5.11)
RDW: 14.8 % (ref 11.5–15.5)
WBC Count: 4.5 10*3/uL (ref 4.0–10.5)
nRBC: 0 % (ref 0.0–0.2)

## 2023-04-21 MED ORDER — SODIUM CHLORIDE 0.9% FLUSH
10.0000 mL | Freq: Once | INTRAVENOUS | Status: AC
  Administered 2023-04-21: 10 mL

## 2023-04-21 MED ORDER — ACETAMINOPHEN 325 MG PO TABS
650.0000 mg | ORAL_TABLET | Freq: Once | ORAL | Status: AC
  Administered 2023-04-21: 650 mg via ORAL
  Filled 2023-04-21: qty 2

## 2023-04-21 MED ORDER — DIPHENHYDRAMINE HCL 50 MG/ML IJ SOLN
25.0000 mg | Freq: Once | INTRAMUSCULAR | Status: AC
  Administered 2023-04-21: 25 mg via INTRAVENOUS
  Filled 2023-04-21: qty 1

## 2023-04-21 MED ORDER — TRASTUZUMAB-ANNS CHEMO 150 MG IV SOLR
2.0000 mg/kg | Freq: Once | INTRAVENOUS | Status: AC
  Administered 2023-04-21: 150 mg via INTRAVENOUS
  Filled 2023-04-21: qty 7.14

## 2023-04-21 MED ORDER — SODIUM CHLORIDE 0.9 % IV SOLN: Freq: Once | INTRAVENOUS | Status: AC

## 2023-04-21 MED ORDER — SODIUM CHLORIDE 0.9% FLUSH
10.0000 mL | INTRAVENOUS | Status: DC | PRN
Start: 2023-04-21 — End: 2023-04-21
  Administered 2023-04-21: 10 mL

## 2023-04-21 MED ORDER — PACLITAXEL PROTEIN-BOUND CHEMO INJECTION 100 MG
80.0000 mg/m2 | Freq: Once | INTRAVENOUS | Status: AC
  Administered 2023-04-21: 150 mg via INTRAVENOUS
  Filled 2023-04-21: qty 30

## 2023-04-21 MED ORDER — PROCHLORPERAZINE MALEATE 10 MG PO TABS
10.0000 mg | ORAL_TABLET | Freq: Once | ORAL | Status: AC
Start: 2023-04-21 — End: 2023-04-21
  Administered 2023-04-21: 10 mg via ORAL
  Filled 2023-04-21: qty 1

## 2023-04-21 MED ORDER — HEPARIN SOD (PORK) LOCK FLUSH 100 UNIT/ML IV SOLN
500.0000 [IU] | Freq: Once | INTRAVENOUS | Status: AC | PRN
  Administered 2023-04-21: 500 [IU]

## 2023-04-21 NOTE — Assessment & Plan Note (Signed)
 [id number], stage IA, G2, HER2 positive, ER positive, PR negative, HER2+, and DCIS(+) It was discovered on screening mammogram, initial biopsy showed DCIS only.   -Status post a right lumpectomy.  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 Margins are negative for invasive cancer, but DCIS margin was close. No lymphovascular or perineural invasion. Lymph node biopsy was not performed, Dr. Dwain Sarna has ordered an ultrasound of the right axilla for further evaluation.  If ultrasound is negative, I think it is okay not to have sentinel lymph node biopsy given the small primary tumor. -Given the aggressive nature of her HER2 positive disease, and moderate risk of recurrence, I do recommend adjuvant chemotherapy with Taxol or Abraxane once weekly for 12 weeks. -she underwent port placement and right axillary SLN biopsy on 01/12/23 and all 3 nodes were negative   -She started to weekly paclitaxel and trastuzumab on February 02, 2023, plan to continue Taxol for 3 months and trastuzumab for 1 year

## 2023-04-21 NOTE — Progress Notes (Signed)
Per Dr. Mosetta Putt ok to treat w/ Trastuzumab and abraxane with elevated HR

## 2023-04-21 NOTE — Progress Notes (Signed)
[facility]   [phone] Fax:([phone]) [phone]   Clinic Follow up Note   Patient Care Team: Tisovec, Adelfa Koh, MD as PCP - General (Internal Medicine) Iran Ouch, MD as PCP - Cardiology (Cardiology) Malachy Mood, MD as Consulting Physician (Hematology) Emelia Loron, MD as Consulting Physician (General Surgery) Dorothy Puffer, MD as Consulting Physician (Radiation Oncology) Pershing Proud, RN as Oncology Nurse Navigator Donnelly Angelica, RN as Oncology Nurse Navigator  Date of Service:  04/21/2023  CHIEF COMPLAINT: f/u of breast cancer  CURRENT THERAPY:  Adjuvant chemotherapy paclitaxel and trastuzumab  Oncology History   Malignant neoplasm of upper-outer quadrant of right breast in female, estrogen receptor positive (HCC) pT1aN0M0, stage IA, G2, HER2 positive, ER positive, PR negative, HER2+, and DCIS(+) It was discovered on screening mammogram, initial biopsy showed DCIS only.   -Status post a right lumpectomy.  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 Margins are negative for invasive cancer, but DCIS margin was close. No lymphovascular or perineural invasion. Lymph node biopsy was not performed, Dr. Dwain Sarna has ordered an ultrasound of the right axilla for further evaluation.  If ultrasound is negative, I think it is okay not to have sentinel lymph node biopsy given the small primary tumor. -Given the aggressive nature of her HER2 positive disease, and moderate risk of recurrence, I do recommend adjuvant chemotherapy with Taxol or Abraxane once weekly for 12 weeks. -she underwent port placement and right axillary SLN biopsy on 01/12/23 and all 3 nodes were negative   -She started to weekly paclitaxel and trastuzumab on February 02, 2023, plan to continue Taxol for 3 months and trastuzumab for 1 year -Adjuvant radiation after she completes  chemotherapy -Adjuvant antiestrogen therapy after she completes radiation.   Assessment and Plan    Breast Cancer 68 year old with breast cancer, currently completing chemotherapy. No new symptoms. Blood counts normal, slightly elevated platelets. Discussed transition to trastuzumab injection pending insurance approval, which would reduce lab work and save time. Radiation therapy to be scheduled post-vacation. Port removal discussed if switching to injection, pending Dr. Doreen Salvage approval. - Proceed with today's chemotherapy session - Request insurance approval for trastuzumab injection - Send note to Dr. Aggie Cosier to schedule radiation therapy post-vacation - If trastuzumab injection approved, switch to injection and schedule accordingly - If trastuzumab injection not approved, continue with infusion - Discuss port removal with Dr. 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 No new health maintenance issues discussed. - Continue current diabetes management regimen  Follow-up -Lab reviewed, adequate for treatment, will proceed to last dose of paclitaxel today -Continue trastuzumab, changed to every 3 weeks from next week -Will submit insurance approval for Herceptin Hylecta, if it is approved, we will remove her port -Will refer her to [facility] radiation oncology for adjuvant radiation -Follow-up in 7 weeks     SUMMARY OF ONCOLOGIC HISTORY: Oncology History  Malignant neoplasm of upper-outer quadrant of right breast in female, estrogen receptor positive (HCC)  10/27/2022 Cancer Staging   Staging form: Breast, AJCC 8th Edition - Clinical stage from 10/27/2022: Stage 0 (cTis (DCIS), cN0, cM0, G3, ER+, PR-, HER2: Not  Assessed) - Signed by  Malachy Mood, MD on 11/03/2022 Stage prefix: Initial diagnosis Histologic grading system: 3 grade system   11/01/2022 Initial Diagnosis   Ductal carcinoma in situ (DCIS) of right breast   11/09/2022 Genetic Testing   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  No other deleterious variants in [facility] Common Hereditary Cancers +RNA Panel. Report date is 11/09/2022.    The [facility] Common Hereditary Cancers + RNA Panel includes sequencing, deletion/duplication, and RNA analysis of the following 48 genes: APC, ATM, AXIN2, BAP1, BARD1, BMPR1A, BRCA1, BRCA2, BRIP1, CDH1, CDK4*, CDKN2A*, CHEK2, CTNNA1, DICER1, EPCAM* (del/dup only), FH, GREM1* (promoter dup analysis only), HOXB13*, KIT*, MBD4*, MEN1, MLH1, MSH2, MSH3, MSH6, MUTYH, NF1, NTHL1, PALB2, PDGFRA*, PMS2, POLD1, POLE, PTEN, RAD51C, RAD51D, SDHA (sequencing only), SDHB, SDHC, SDHD, SMAD4, SMARCA4, STK11, TP53, TSC1, TSC2, VHL.  *Genes without RNA analysis.    12/07/2022 Cancer Staging   Staging form: Breast, AJCC 8th Edition - Pathologic stage from 12/07/2022: Stage IA (pT1a, pN0, cM0, G2, ER+, PR-, HER2+) - Signed by Malachy Mood, MD on 12/20/2022 Stage prefix: Initial diagnosis Histologic grading system: 3 grade system Residual tumor (R): R0 - None   02/02/2023 -  Chemotherapy   Patient is on Treatment Plan : BREAST Abraxane + Trastuzumab q7d / Trastuzumab q21d        Discussed the use of AI scribe software for clinical note transcription with the patient, who gave verbal consent to proceed.  History of Present Illness   The patient, a 68 year old woman with a history of breast cancer, is currently undergoing chemotherapy. She reports no new symptoms or issues since her last visit. She mentions that her hair is starting to fall out, but she does not expect to lose all of it. She denies experiencing any numbness or tingling in her fingers and toes and reports no problems with her energy levels or appetite.  She has been using ice as part of her treatment regimen.  The patient also mentions that she has been dealing with sores on her face, which her dermatologist attributes to the chemotherapy killing off bad cells. She has been managing these sores without any significant issues.  In addition to her cancer treatment, the patient is also managing diabetes, for which she administers four injections daily. She expresses no preference between receiving her cancer treatment as an infusion or as an injection, stating that she is comfortable with either option.  She expresses no concerns about this upcoming treatment.         All other systems were reviewed with the patient and are negative.  MEDICAL HISTORY:  Past Medical History:  Diagnosis Date   Anemia    Arthritis    Atypical chest pain    a. 05/2018 CTA chest: No PE; b. 05/2018 MV: No ischemia/scar. EF 65%.   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 05/2018 CT Chest: Coronary and Ao atherosclerotic Calcifications.   Diabetes mellitus without complication (HCC)    Diastolic dysfunction    a. 05/2018 Echo: EF 60-65%, impaired relaxation. Nl RVSP.   Dysrhythmia    PVC's   Family history of adverse reaction to anesthesia    sister-malignant hyperthermia   FH of Malignant hyperthermia    a. sister had malginant hyperthermia   History of kidney stones    History of renal insufficiency    History of skin cancer  Hyperlipemia    Osteoporosis    Pulmonary nodules    a. 05/2018 CT Chest: Small pulm nodules measuring up to 6mm avg diameter. Rec non-contrast Chest CT in 6-12 mos.   Thyroid nodule     SURGICAL HISTORY: Past Surgical History:  Procedure Laterality Date   AXILLARY LYMPH NODE BIOPSY Right 01/12/2023   Procedure: RIGHT AXILLARY SENTINEL NODE BIOPSY;  Surgeon: Emelia Loron, MD;  Location: [facility] OR;  Service: General;   Laterality: Right;  LMA   BREAST BIOPSY Right 10/27/2022   MM RT BREAST BX W LOC DEV 1ST LESION IMAGE BX SPEC STEREO GUIDE 10/27/2022 [facility]   BREAST BIOPSY  12/06/2022   MM RT RADIOACTIVE SEED LOC MAMMO GUIDE 12/06/2022 [facility]   BREAST LUMPECTOMY WITH RADIOACTIVE SEED LOCALIZATION Right 12/07/2022   Procedure: RIGHT BREAST LUMPECTOMY WITH RADIOACTIVE SEED LOCALIZATION;  Surgeon: Emelia Loron, MD;  Location: [facility] OR;  Service: General;  Laterality: Right;   CESAREAN SECTION     CHOLECYSTECTOMY  2006   lap choli with umb hernia   COLONOSCOPY     HYSTEROSCOPY WITH D & C N/A 12/07/2022   Procedure: DILATATION AND CURETTAGE /HYSTEROSCOPY WITH POLYPECTOMY ENDOMETRIAL BIOPSY;  Surgeon: Waynard Reeds, MD;  Location: [facility] OR;  Service: Gynecology;  Laterality: N/A;   LIPOMA EXCISION Left 03/15/2014   Procedure: EXCISION LIPOMA LEFT LOWER QUARDRANT ABDOMINAL WALL;  Surgeon: Violeta Gelinas, MD;  Location: [facility];  Service: General;  Laterality: Left;   PORTACATH PLACEMENT N/A 01/12/2023   Procedure: PORT PLACEMENT WITH ULTRASOUND GUIDANCE;  Surgeon: Emelia Loron, MD;  Location: [facility] OR;  Service: General;  Laterality: N/A;   THYROIDECTOMY N/A 06/25/2019   Procedure: TOTAL THYROIDECTOMY;  Surgeon: Darnell Level, MD;  Location[facility];  Service: General;  Laterality: N/A;   TONSILLECTOMY     TRIGGER FINGER RELEASE  2012   lt small,middle,long   TRIGGER FINGER RELEASE Right 07/14/2017   Procedure: RELEASE TRIGGER FINGER/A-1 PULLEY RIGHT THUMB;  Surgeon: Cindee Salt, MD;  Location: [facility];  Service: Orthopedics;  Laterality: Right;   UMBILICAL HERNIA REPAIR  2006   with lap choli    I have reviewed the social history and family history with the patient and they are unchanged from previous note.  ALLERGIES:  has no known allergies.  MEDICATIONS:  Current Outpatient Medications  Medication Sig Dispense Refill   aspirin EC 81 MG tablet Take  81 mg by mouth daily.     ciprofloxacin (CIPRO) 500 MG tablet Take 1 tablet (500 mg total) by mouth 2 (two) times daily. (Patient not taking: Reported on 03/25/2023) 14 tablet 0   Insulin Glargine (BASAGLAR KWIKPEN) 100 UNIT/ML SOPN Inject 35 Units into the skin at bedtime.     insulin lispro (HUMALOG) 100 UNIT/ML injection Inject 10-15 Units into the skin 3 (three) times daily before meals. Sliding scale     levothyroxine (SYNTHROID) 112 MCG tablet Take 112 mcg by mouth daily before breakfast.     lidocaine-prilocaine (EMLA) cream Apply to affected area once 30 g 3   metFORMIN (GLUCOPHAGE) 1000 MG tablet Take 1,000 mg by mouth 2 (two) times daily with a meal.     metoprolol succinate (TOPROL-XL) 25 MG 24 hr tablet TAKE 1 TABLET (25 MG TOTAL) BY MOUTH DAILY. 90 tablet 0   nitrofurantoin (MACRODANTIN) 100 MG capsule Take 100 mg by mouth daily.     Nutritional Supplements (JUICE PLUS FIBRE PO) Take 3 tablets by mouth daily. Juice Plus Vegetable  Nutritional Supplements (JUICE PLUS FIBRE PO) Take 3 tablets by mouth daily. Juice Plus Fruit     ondansetron (ZOFRAN) 8 MG tablet Take 1 tablet (8 mg total) by mouth every 8 (eight) hours as needed for nausea or vomiting. 30 tablet 1   prochlorperazine (COMPAZINE) 10 MG tablet Take 1 tablet (10 mg total) by mouth every 6 (six) hours as needed for nausea or vomiting. 30 tablet 1   ramipril (ALTACE) 5 MG capsule Take 5 mg by mouth daily with breakfast.     rosuvastatin (CRESTOR) 20 MG tablet Take 20 mg by mouth daily with supper.      triamcinolone cream (KENALOG) 0.1 % Apply 1 Application topically daily as needed (dermatitis).     No current facility-administered medications for this visit.    PHYSICAL EXAMINATION: ECOG PERFORMANCE STATUS: 0 - Asymptomatic  Vitals:   04/21/23 1248 04/21/23 1252  BP: (!) 158/90 (!) 149/84  Pulse: (!) 105 (!) 106  Resp: 16   Temp: (!) 97 F (36.1 C)   SpO2: 100%    Wt Readings from Last 3 Encounters:   04/21/23 181 lb 1.6 oz (82.1 kg)  04/13/23 181 lb 4 oz (82.2 kg)  04/07/23 181 lb 11.2 oz (82.4 kg)     GENERAL:alert, no distress and comfortable SKIN: skin color, texture, turgor are normal, no rashes or significant lesions EYES: normal, Conjunctiva are pink and non-injected, sclera clear NECK: supple, thyroid normal size, non-tender, without nodularity LYMPH:  no palpable lymphadenopathy in the cervical, axillary  LUNGS: clear to auscultation and percussion with normal breathing effort HEART: regular rate & rhythm and no murmurs and no lower extremity edema ABDOMEN:abdomen soft, non-tender and normal bowel sounds Musculoskeletal:no cyanosis of digits and no clubbing  NEURO: alert & oriented x 3 with fluent speech, no focal motor/sensory deficits    LABORATORY DATA:  I have reviewed the data as listed    Latest Ref Rng & Units 04/21/2023   12:25 PM 04/13/2023    1:36 PM 04/07/2023   11:26 AM  CBC  WBC 4.0 - 10.5 K/uL 4.5  5.8  4.5   Hemoglobin 12.0 - 15.0 g/dL 16.1  09.6  04.5   Hematocrit 36.0 - 46.0 % 38.2  35.1  35.3   Platelets 150 - 400 K/uL 443  385  376         Latest Ref Rng & Units 04/21/2023   12:25 PM 04/13/2023    1:36 PM 04/07/2023   11:26 AM  CMP  Glucose 70 - 99 mg/dL 409  811  914   BUN 8 - 23 mg/dL 12  8  9    Creatinine 0.44 - 1.00 mg/dL 7.82  9.56  2.13   Sodium 135 - 145 mmol/L 136  136  134   Potassium 3.5 - 5.1 mmol/L 3.8  3.6  3.7   Chloride 98 - 111 mmol/L 100  101  98   CO2 22 - 32 mmol/L 28  28  29    Calcium 8.9 - 10.3 mg/dL 08.6  9.7  57.8   Total Protein 6.5 - 8.1 g/dL 7.4  6.8  7.1   Total Bilirubin 0.0 - 1.2 mg/dL 0.6  0.5  0.5   Alkaline Phos 38 - 126 U/L 65  61  64   AST 15 - 41 U/L 11  11  12    ALT 0 - 44 U/L 12  12  14        RADIOGRAPHIC STUDIES: I have personally reviewed  the radiological images as listed and agreed with the findings in the report. No results found.    No orders of the defined types were placed in this  encounter.  All questions were answered. The patient knows to call the clinic with any problems, questions or concerns. No barriers to learning was detected. The total time spent in the appointment was 30 minutes.     Malachy Mood, MD 04/21/2023

## 2023-04-21 NOTE — Patient Instructions (Signed)
[facility] - A DEPT OF [facility]  Discharge Instructions: Thank you for choosing [facility] to provide your oncology and hematology care.   If you have a lab appointment with the Cancer Center, please go directly to the Cancer Center and check in at the registration area.   Wear comfortable clothing and clothing appropriate for easy access to any Portacath or PICC line.   We strive to give you quality time with your provider. You may need to reschedule your appointment if you arrive late (15 or more minutes).  Arriving late affects you and other patients whose appointments are after yours.  Also, if you miss three or more appointments without notifying the office, you may be dismissed from the clinic at the provider's discretion.      For prescription refill requests, have your pharmacy contact our office and allow 72 hours for refills to be completed.    Today you received the following chemotherapy and/or immunotherapy agents: Trastuzumab/Abraxane      To help prevent nausea and vomiting after your treatment, we encourage you to take your nausea medication as directed.  BELOW ARE SYMPTOMS THAT SHOULD BE REPORTED IMMEDIATELY: *FEVER GREATER THAN 100.4 F (38 C) OR HIGHER *CHILLS OR SWEATING *NAUSEA AND VOMITING THAT IS NOT CONTROLLED WITH YOUR NAUSEA MEDICATION *UNUSUAL SHORTNESS OF BREATH *UNUSUAL BRUISING OR BLEEDING *URINARY PROBLEMS (pain or burning when urinating, or frequent urination) *BOWEL PROBLEMS (unusual diarrhea, constipation, pain near the anus) TENDERNESS IN MOUTH AND THROAT WITH OR WITHOUT PRESENCE OF ULCERS (sore throat, sores in mouth, or a toothache) UNUSUAL RASH, SWELLING OR PAIN  UNUSUAL VAGINAL DISCHARGE OR ITCHING   Items with * indicate a potential emergency and should be followed up as soon as possible or go to the Emergency Department if any problems should occur.  Please show the CHEMOTHERAPY ALERT CARD or  IMMUNOTHERAPY ALERT CARD at check-in to the Emergency Department and triage nurse.  Should you have questions after your visit or need to cancel or reschedule your appointment, please contact [facility] - A DEPT OF [facility]  Dept: [phone]  and follow the prompts.  Office hours are 8:00 a.m. to 4:30 p.m. Monday - Friday. Please note that voicemails left after 4:00 p.m. may not be returned until the following business day.  We are closed weekends and major holidays. You have access to a nurse at all times for urgent questions. Please call the main number to the clinic Dept: [phone] and follow the prompts.   For any non-urgent questions, you may also contact your provider using MyChart. We now offer e-Visits for anyone 40 and older to request care online for non-urgent symptoms. For details visit [email or web address].   Also download the MyChart app! Go to the app store, search "MyChart", open the app, select [facility], and log in with your MyChart username and password.

## 2023-04-22 ENCOUNTER — Encounter: Payer: Self-pay | Admitting: *Deleted

## 2023-04-25 ENCOUNTER — Other Ambulatory Visit: Payer: Self-pay | Admitting: Hematology

## 2023-04-25 ENCOUNTER — Telehealth: Payer: Self-pay | Admitting: Hematology

## 2023-04-25 ENCOUNTER — Telehealth: Payer: Self-pay

## 2023-04-25 DIAGNOSIS — Z170 Estrogen receptor positive status [ER+]: Secondary | ICD-10-CM

## 2023-04-25 NOTE — Telephone Encounter (Signed)
Patient is aware of scheduled appointment changes for 05/17/2023 appointment

## 2023-04-25 NOTE — Telephone Encounter (Signed)
Pt called stating she was in the office to see Dr. Mosetta Putt last week at which time Dr. Mosetta Putt mentioned changing the pt's infusion to Herceptin Hylecta injection.  Pt stated that during that time they also discussed that the pt would need to be scheduled on 05/17/2023 d/t pt will be out of town on 05/19/2023.  Pt also stated she's scheduled for Lab which she's usually scheduled for [facility] w/lab.  Pt stated she's LVM for Scheduling but have not yet been called back nor has her appts been changed; therefore, pt contact Dr. Latanya Maudlin nurse.  Stated this nurse will send a message to Dr. Latanya Maudlin scheduler to make the necessary correction.

## 2023-04-27 ENCOUNTER — Encounter: Payer: Self-pay | Admitting: Hematology

## 2023-04-28 ENCOUNTER — Inpatient Hospital Stay: Payer: Medicare Other

## 2023-04-28 VITALS — BP 123/73 | HR 90 | Temp 98.5°F | Resp 18

## 2023-04-28 DIAGNOSIS — Z5112 Encounter for antineoplastic immunotherapy: Secondary | ICD-10-CM | POA: Diagnosis not present

## 2023-04-28 DIAGNOSIS — Z170 Estrogen receptor positive status [ER+]: Secondary | ICD-10-CM

## 2023-04-28 MED ORDER — TRASTUZUMAB-ANNS CHEMO 150 MG IV SOLR
2.0000 mg/kg | Freq: Once | INTRAVENOUS | Status: AC
  Administered 2023-04-28: 150 mg via INTRAVENOUS
  Filled 2023-04-28: qty 7.14

## 2023-04-28 MED ORDER — DIPHENHYDRAMINE HCL 25 MG PO CAPS
25.0000 mg | ORAL_CAPSULE | Freq: Once | ORAL | Status: AC
  Administered 2023-04-28: 25 mg via ORAL
  Filled 2023-04-28: qty 1

## 2023-04-28 MED ORDER — ACETAMINOPHEN 325 MG PO TABS
650.0000 mg | ORAL_TABLET | Freq: Once | ORAL | Status: AC
  Administered 2023-04-28: 650 mg via ORAL
  Filled 2023-04-28: qty 2

## 2023-04-28 MED ORDER — SODIUM CHLORIDE 0.9 % IV SOLN: Freq: Once | INTRAVENOUS | Status: AC

## 2023-04-28 MED ORDER — HEPARIN SOD (PORK) LOCK FLUSH 100 UNIT/ML IV SOLN
500.0000 [IU] | Freq: Once | INTRAVENOUS | Status: AC | PRN
  Administered 2023-04-28: 500 [IU]

## 2023-04-28 MED ORDER — SODIUM CHLORIDE 0.9% FLUSH
10.0000 mL | INTRAVENOUS | Status: DC | PRN
  Administered 2023-04-28: 10 mL

## 2023-04-28 NOTE — Patient Instructions (Signed)

## 2023-05-02 ENCOUNTER — Ambulatory Visit
Admission: RE | Admit: 2023-05-02 | Discharge: 2023-05-02 | Disposition: A | Discharge status: Home / Self Care | Payer: Medicare Other | Source: Ambulatory Visit | Attending: Radiation Oncology | Admitting: Radiation Oncology

## 2023-05-02 ENCOUNTER — Encounter: Payer: Self-pay | Admitting: Radiation Oncology

## 2023-05-02 ENCOUNTER — Encounter: Payer: Self-pay | Admitting: Hematology

## 2023-05-02 ENCOUNTER — Other Ambulatory Visit: Payer: Self-pay | Admitting: Hematology

## 2023-05-02 VITALS — BP 166/95 | HR 108 | Temp 97.5°F | Resp 16 | Wt 181.0 lb

## 2023-05-02 DIAGNOSIS — D0511 Intraductal carcinoma in situ of right breast: Secondary | ICD-10-CM | POA: Diagnosis present

## 2023-05-02 DIAGNOSIS — Z170 Estrogen receptor positive status [ER+]: Secondary | ICD-10-CM | POA: Insufficient documentation

## 2023-05-02 DIAGNOSIS — Z510 Encounter for antineoplastic radiation therapy: Secondary | ICD-10-CM | POA: Diagnosis present

## 2023-05-02 DIAGNOSIS — Z1722 Progesterone receptor negative status: Secondary | ICD-10-CM | POA: Diagnosis not present

## 2023-05-02 NOTE — Consult Note (Signed)
NEW PATIENT EVALUATION  Name: [name]  MRN: [id number]  Date:   05/02/2023     DOB: 1955/10/04   This 68 y.o. female patient presents to the clinic for initial evaluation of stage Ia (pT1a N0 M0) ER positive PR negative HER2/neu overexpressed status post wide local excision and adjuvant chemotherapy.  REFERRING PHYSICIAN: Malachy Mood, MD  CHIEF COMPLAINT:  Chief Complaint  Patient presents with   DCIS   Breast Cancer    DIAGNOSIS: The encounter diagnosis was Malignant neoplasm of upper-outer quadrant of right breast in female, estrogen receptor positive (HCC).   PREVIOUS INVESTIGATIONS:  Mammogram and ultrasound reviewed Clinical notes reviewed Pathology reports reviewed  HPI: Patient is a 68 year old female who presented with an abnormal mammogram of her right breast.  There was an area of 1.7 cm of indeterminate calcifications in the upper outer quadrant of the right breast for which stereotactic biopsy was performed.  Biopsy was positive for invasive mammary carcinoma.  Axilla was normal by ultrasound.  She underwent a wide local excision for a 0.5 cm ER positive PR negative HER2/neu overexpressed overall grade 2 invasive mammary carcinoma.  All margins were negative for invasive carcinoma 11 mm was the closest margin.  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  Initially no lymph nodes were submitted she underwent repeat sentinel node biopsy showing 3 sentinel nodes negative for metastatic disease.  Patient has undergone Taxol therapy and currently is on trastuzumab which she is tolerating well.  She is seen today for radiation oncology opinion.  She is doing well specifically denies breast tenderness cough or bone pain.  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    PAST SURGICAL HISTORY:  Past Surgical History:  Procedure Laterality Date   AXILLARY LYMPH NODE BIOPSY Right 01/12/2023   Procedure: RIGHT AXILLARY SENTINEL NODE BIOPSY;  Surgeon: Emelia Loron, MD;  Location: [facility];  Service: General;  Laterality: Right;  LMA   BREAST BIOPSY Right 10/27/2022   MM RT BREAST BX W LOC DEV 1ST LESION IMAGE BX SPEC STEREO GUIDE 10/27/2022 [facility]   BREAST BIOPSY  12/06/2022   MM RT RADIOACTIVE SEED LOC MAMMO GUIDE 12/06/2022 [facility]   BREAST LUMPECTOMY WITH RADIOACTIVE SEED LOCALIZATION Right 12/07/2022   Procedure: RIGHT BREAST LUMPECTOMY WITH RADIOACTIVE SEED LOCALIZATION;  Surgeon: Emelia Loron, MD;  Location: [facility] OR;  Service: General;  Laterality: Right;   CESAREAN SECTION     CHOLECYSTECTOMY  2006   lap choli with umb hernia   COLONOSCOPY     HYSTEROSCOPY WITH D & C N/A 12/07/2022   Procedure: DILATATION AND CURETTAGE /HYSTEROSCOPY WITH POLYPECTOMY ENDOMETRIAL BIOPSY;  Surgeon: Waynard Reeds, MD;  Location: [facility] OR;  Service: Gynecology;  Laterality: N/A;   LIPOMA EXCISION Left 03/15/2014   Procedure: EXCISION LIPOMA LEFT LOWER QUARDRANT ABDOMINAL WALL;  Surgeon: Violeta Gelinas, MD;  Location: [facility];  Service: General;  Laterality: Left;   PORTACATH PLACEMENT N/A 01/12/2023   Procedure: PORT PLACEMENT WITH ULTRASOUND GUIDANCE;  Surgeon: Emelia Loron, MD;  Location:  [facility] OR;  Service: General;  Laterality: N/A;   THYROIDECTOMY N/A 06/25/2019   Procedure: TOTAL THYROIDECTOMY;  Surgeon: Darnell Level, MD;  Location: [facility];  Service: General;   Laterality: N/A;   TONSILLECTOMY     TRIGGER FINGER RELEASE  2012   lt small,middle,long   TRIGGER FINGER RELEASE Right 07/14/2017   Procedure: RELEASE TRIGGER FINGER/A-1 PULLEY RIGHT THUMB;  Surgeon: Cindee Salt, MD;  Location: [facility];  Service: Orthopedics;  Laterality: Right;   UMBILICAL HERNIA REPAIR  2006   with lap choli    FAMILY HISTORY: family history includes Breast cancer (age of onset: 32) in her sister; Breast cancer (age of onset: 22) in her sister; Diabetes in her father; Heart disease in her mother and another family member; Throat cancer in her mother.  SOCIAL HISTORY:  reports that she has never smoked. She has never used smokeless tobacco. She reports that she does not drink alcohol and does not use drugs.  ALLERGIES: Patient has no known allergies.  MEDICATIONS:  Current Outpatient Medications  Medication Sig Dispense Refill   aspirin EC 81 MG tablet Take 81 mg by mouth daily.     insulin lispro (HUMALOG) 100 UNIT/ML injection Inject 10-15 Units into the skin 3 (three) times daily before meals. Sliding scale     levothyroxine (SYNTHROID) 112 MCG tablet Take 112 mcg by mouth daily before breakfast.     lidocaine-prilocaine (EMLA) cream Apply to affected area once 30 g 3   metFORMIN (GLUCOPHAGE) 1000 MG tablet Take 1,000 mg by mouth 2 (two) times daily with a meal.     metoprolol succinate (TOPROL-XL) 25 MG 24 hr tablet TAKE 1 TABLET (25 MG TOTAL) BY MOUTH DAILY. 90 tablet 0   nitrofurantoin (MACRODANTIN) 100 MG capsule Take 100 mg by mouth daily.     Nutritional Supplements (JUICE PLUS FIBRE PO) Take 3 tablets by mouth daily. Juice Plus Vegetable     Nutritional Supplements (JUICE PLUS FIBRE PO) Take 3 tablets by mouth daily. Juice Plus Fruit     ramipril (ALTACE) 5 MG capsule Take 5 mg by mouth daily with breakfast.     rosuvastatin (CRESTOR) 20 MG tablet Take 20 mg by mouth daily with supper.      triamcinolone cream (KENALOG) 0.1 % Apply 1  Application topically daily as needed (dermatitis).     Insulin Glargine (BASAGLAR KWIKPEN) 100 UNIT/ML SOPN Inject 35 Units into the skin at bedtime.     No current facility-administered medications for this encounter.    ECOG PERFORMANCE STATUS:  0 - Asymptomatic  REVIEW OF SYSTEMS: Patient denies any weight loss, fatigue, weakness, fever, chills or night sweats. Patient denies any loss of vision, blurred vision. Patient denies any ringing  of the ears or hearing loss. No irregular heartbeat. Patient denies heart murmur or history of fainting. Patient denies any chest pain or pain radiating to her upper extremities. Patient denies any shortness of breath, difficulty breathing at night, cough or hemoptysis. Patient denies any swelling in the lower legs. Patient denies any nausea vomiting, vomiting of blood, or coffee ground material in the vomitus. Patient denies any stomach pain. Patient states has had normal bowel movements no significant constipation or diarrhea. Patient denies any dysuria, hematuria or significant nocturia. Patient denies any problems walking, swelling in the joints or loss of balance. Patient denies any skin changes, loss of hair or loss of weight. Patient denies any excessive worrying or anxiety or significant depression. Patient denies any problems  with insomnia. Patient denies excessive thirst, polyuria, polydipsia. Patient denies any swollen glands, patient denies easy bruising or easy bleeding. Patient denies any recent infections, allergies or URI. Patient "s visual fields have not changed significantly in recent time.   PHYSICAL EXAM: BP (!) 166/95 Comment: Patient denies H/A dizziness will patient monitor at home,  Pulse (!) 108   Temp (!) 97.5 F (36.4 C)   Resp 16   Wt 181 lb (82.1 kg)   BMI 29.21 kg/m  Patient is status post wide local excision of the right breast incision is well-healed no dominant masses noted in either breast.  No axillary or supraclavicular  adenopathy is identified.  Well-developed well-nourished patient in NAD. HEENT reveals PERLA, EOMI, discs not visualized.  Oral cavity is clear. No oral mucosal lesions are identified. Neck is clear without evidence of cervical or supraclavicular adenopathy. Lungs are clear to A&P. Cardiac examination is essentially unremarkable with regular rate and rhythm without murmur rub or thrill. Abdomen is benign with no organomegaly or masses noted. Motor sensory and DTR levels are equal and symmetric in the upper and lower extremities. Cranial nerves II through XII are grossly intact. Proprioception is intact. No peripheral adenopathy or edema is identified. No motor or sensory levels are noted. Crude visual fields are within normal range.  LABORATORY DATA: Pathology reports reviewed    RADIOLOGY RESULTS: Mammogram and ultrasound reviewed compatible with above-stated findings   IMPRESSION: Stage Ia ER positive PR negative HER2/neu overexpressed invasive mammary carcinoma the right breast status post wide local excision and adjuvant chemotherapy in 68 year old female  PLAN: At this time based on her close DCIS margin would treat her whole breast and a hypofractionated course of treatment over 3 weeks boosting her scar another 1600 centigrade using photon beam therapy.  Risks and benefits of treatment including skin reaction fatigue alteration of blood counts possible inclusion of superficial lung all were discussed in detail with the patient.  She is going on vacation towards the middle of February will start her treatments right after that vacation.  I have personally set up and ordered CT simulation.  Patient also be a candidate for endocrine therapy after in the future.  Patient comprehends my recommendations well.  I would like to take this opportunity to thank you for allowing me to participate in the care of your patient.Carmina Miller, MD

## 2023-05-05 ENCOUNTER — Other Ambulatory Visit: Payer: Self-pay

## 2023-05-09 ENCOUNTER — Encounter: Payer: Self-pay | Admitting: *Deleted

## 2023-05-10 ENCOUNTER — Other Ambulatory Visit: Payer: Self-pay

## 2023-05-12 ENCOUNTER — Ambulatory Visit
Admission: RE | Admit: 2023-05-12 | Discharge: 2023-05-12 | Disposition: A | Discharge status: Home / Self Care | Payer: Medicare Other | Source: Ambulatory Visit | Attending: Radiation Oncology | Admitting: Radiation Oncology

## 2023-05-12 DIAGNOSIS — Z510 Encounter for antineoplastic radiation therapy: Secondary | ICD-10-CM | POA: Diagnosis not present

## 2023-05-13 ENCOUNTER — Encounter: Payer: Self-pay | Admitting: Hematology

## 2023-05-13 NOTE — Addendum Note (Signed)
Addended by: Malachy Mood on: 05/13/2023 10:20 AM   Modules accepted: Orders

## 2023-05-16 ENCOUNTER — Inpatient Hospital Stay: Payer: Medicare Other | Attending: Hematology

## 2023-05-16 VITALS — BP 131/82 | HR 103 | Temp 98.0°F | Resp 16 | Wt 180.5 lb

## 2023-05-16 DIAGNOSIS — Z1731 Human epidermal growth factor receptor 2 positive status: Secondary | ICD-10-CM | POA: Diagnosis not present

## 2023-05-16 DIAGNOSIS — Z1722 Progesterone receptor negative status: Secondary | ICD-10-CM | POA: Diagnosis not present

## 2023-05-16 DIAGNOSIS — C50411 Malignant neoplasm of upper-outer quadrant of right female breast: Secondary | ICD-10-CM | POA: Diagnosis present

## 2023-05-16 DIAGNOSIS — Z170 Estrogen receptor positive status [ER+]: Secondary | ICD-10-CM | POA: Insufficient documentation

## 2023-05-16 DIAGNOSIS — Z5112 Encounter for antineoplastic immunotherapy: Secondary | ICD-10-CM | POA: Insufficient documentation

## 2023-05-16 MED ORDER — TRASTUZUMAB-HYALURONIDASE-OYSK 600-10000 MG-UNT/5ML ~~LOC~~ SOLN
600.0000 mg | Freq: Once | SUBCUTANEOUS | Status: AC
  Administered 2023-05-16: 600 mg via SUBCUTANEOUS
  Filled 2023-05-16: qty 5

## 2023-05-16 MED ORDER — DIPHENHYDRAMINE HCL 25 MG PO CAPS
25.0000 mg | ORAL_CAPSULE | Freq: Once | ORAL | Status: AC
Start: 2023-05-16 — End: 2023-05-16
  Administered 2023-05-16: 25 mg via ORAL
  Filled 2023-05-16: qty 1

## 2023-05-16 MED ORDER — ACETAMINOPHEN 325 MG PO TABS
650.0000 mg | ORAL_TABLET | Freq: Once | ORAL | Status: AC
Start: 2023-05-16 — End: 2023-05-16
  Administered 2023-05-16: 650 mg via ORAL
  Filled 2023-05-16: qty 2

## 2023-05-16 NOTE — Progress Notes (Signed)
 Pt. observed 30 minutes post Herception Hylecta. No issues noted, vital signs stable, left via ambulation, and no shortness of breath noted.

## 2023-05-16 NOTE — Patient Instructions (Signed)
 [facility] - A DEPT OF [facility]  Discharge Instructions: Thank you for choosing [facility] to provide your oncology and hematology care.   If you have a lab appointment with the Cancer Center, please go directly to the Cancer Center and check in at the registration area.   Wear comfortable clothing and clothing appropriate for easy access to any Portacath or PICC line.   We strive to give you quality time with your provider. You may need to reschedule your appointment if you arrive late (15 or more minutes).  Arriving late affects you and other patients whose appointments are after yours.  Also, if you miss three or more appointments without notifying the office, you may be dismissed from the clinic at the provider's discretion.      For prescription refill requests, have your pharmacy contact our office and allow 72 hours for refills to be completed.    Today you received the following chemotherapy and/or immunotherapy agent: Herceptin Hylecta.      To help prevent nausea and vomiting after your treatment, we encourage you to take your nausea medication as directed.  BELOW ARE SYMPTOMS THAT SHOULD BE REPORTED IMMEDIATELY: *FEVER GREATER THAN 100.4 F (38 C) OR HIGHER *CHILLS OR SWEATING *NAUSEA AND VOMITING THAT IS NOT CONTROLLED WITH YOUR NAUSEA MEDICATION *UNUSUAL SHORTNESS OF BREATH *UNUSUAL BRUISING OR BLEEDING *URINARY PROBLEMS (pain or burning when urinating, or frequent urination) *BOWEL PROBLEMS (unusual diarrhea, constipation, pain near the anus) TENDERNESS IN MOUTH AND THROAT WITH OR WITHOUT PRESENCE OF ULCERS (sore throat, sores in mouth, or a toothache) UNUSUAL RASH, SWELLING OR PAIN  UNUSUAL VAGINAL DISCHARGE OR ITCHING   Items with * indicate a potential emergency and should be followed up as soon as possible or go to the Emergency Department if any problems should occur.  Please show the CHEMOTHERAPY ALERT CARD or  IMMUNOTHERAPY ALERT CARD at check-in to the Emergency Department and triage nurse.  Should you have questions after your visit or need to cancel or reschedule your appointment, please contact [facility] - A DEPT OF [facility]  Dept: [phone]  and follow the prompts.  Office hours are 8:00 a.m. to 4:30 p.m. Monday - Friday. Please note that voicemails left after 4:00 p.m. may not be returned until the following business day.  We are closed weekends and major holidays. You have access to a nurse at all times for urgent questions. Please call the main number to the clinic Dept: [phone] and follow the prompts.   For any non-urgent questions, you may also contact your provider using MyChart. We now offer e-Visits for anyone 60 and older to request care online for non-urgent symptoms. For details visit [email or web address].   Also download the MyChart app! Go to the app store, search "MyChart", open the app, select [facility], and log in with your MyChart username and password. Trastuzumab; Hyaluronidase Injection What is this medication? TRASTUZUMAB; HYALURONIDASE (tras TOO zoo mab; hye al ur ON i dase) treats breast cancer. Trastuzumab works by blocking a protein that causes cancer cells to grow and multiply. This helps to slow or stop the spread of cancer cells. Hyaluronidase works by increasing the absorption of other medications in the body to help them work better. It is a combination medication that contains a monoclonal antibody. This medicine may be used for other purposes; ask your health care provider or pharmacist if you have  questions. COMMON BRAND NAME(S): HERCEPTIN HYLECTA What should I tell my care team before I take this medication? They need to know if you have any of these conditions: Heart failure Lung disease An unusual or allergic reaction to trastuzumab, or other medications, foods, dyes, or preservatives Pregnant or trying to get  pregnant Breast-feeding How should I use this medication? This medication is injected under the skin. It is given by your care team in a hospital or clinic setting. Talk to your care team about the use of this medication in children. It is not approved for use in children. Overdosage: If you think you have taken too much of this medicine contact a poison control center or emergency room at once. NOTE: This medicine is only for you. Do not share this medicine with others. What if I miss a dose? Keep appointments for follow-up doses. It is important not to miss your dose. Call your care team if you are unable to keep an appointment. What may interact with this medication? Certain types of chemotherapy, such as daunorubicin, doxorubicin, epirubicin, idarubicin This list may not describe all possible interactions. Give your health care provider a list of all the medicines, herbs, non-prescription drugs, or dietary supplements you use. Also tell them if you smoke, drink alcohol, or use illegal drugs. Some items may interact with your medicine. What should I watch for while using this medication? Your condition will be monitored carefully while you are receiving this medication. This medication may make you feel generally unwell. This is not uncommon as chemotherapy can affect healthy cells as well as cancer cells. Report any side effects. Continue your course of treatment even though you feel ill unless your care team tells you to stop. This medication may increase your risk of getting an infection. Call your care team for advice if you get a fever, chills, sore throat, or other symptoms of a cold or flu. Do not treat yourself. Try to avoid being around people who are sick. Avoid taking medications that contain aspirin, acetaminophen, ibuprofen, naproxen, or ketoprofen unless instructed by your care team. These medications may hide a fever. Talk to your care team if you may be pregnant. Serious birth  defects can occur if you take this medication during pregnancy and for 7 months after the last dose. You will need a negative pregnancy test before starting this medication. Contraception is recommended while taking this medication and for 7 months after the last dose. Your care team can help you find the option that works for you. Do not breastfeed while taking this medication or for 7 months after the last dose. What side effects may I notice from receiving this medication? Side effects that you should report to your care team as soon as possible: Allergic reactions or angioedema--skin rash, itching or hives, swelling of the face, eyes, lips, tongue, arms, or legs, trouble swallowing or breathing Dry cough, shortness of breath or trouble breathing Heart failure--shortness of breath, swelling of the ankles, feet, or hands, sudden weight gain, unusual weakness or fatigue Heart rhythm changes--fast or irregular heartbeat, dizziness, feeling faint or lightheaded, chest pain, trouble breathing Increase in blood pressure Infection--fever, chills, cough, or sore throat Side effects that usually do not require medical attention (report these to your care team if they continue or are bothersome): Diarrhea Hair loss Headache Muscle pain Nausea Unusual weakness or fatigue This list may not describe all possible side effects. Call your doctor for medical advice about side effects. You may report  side effects to FDA at 1-800-FDA-1088. Where should I keep my medication? This medication is given in a hospital or clinic. It will not be stored at home. NOTE: This sheet is a summary. It may not cover all possible information. If you have questions about this medicine, talk to your doctor, pharmacist, or health care provider.  2024 Elsevier/Gold Standard (2021-07-28 00:00:00)

## 2023-05-17 ENCOUNTER — Telehealth: Payer: Self-pay | Admitting: *Deleted

## 2023-05-17 ENCOUNTER — Other Ambulatory Visit: Payer: Medicare Other

## 2023-05-17 ENCOUNTER — Inpatient Hospital Stay: Payer: Medicare Other

## 2023-05-17 ENCOUNTER — Other Ambulatory Visit: Payer: Self-pay | Admitting: *Deleted

## 2023-05-17 ENCOUNTER — Ambulatory Visit: Payer: Medicare Other | Admitting: Hematology

## 2023-05-17 DIAGNOSIS — Z510 Encounter for antineoplastic radiation therapy: Secondary | ICD-10-CM | POA: Diagnosis not present

## 2023-05-17 NOTE — Telephone Encounter (Signed)
 Called pt to see how she did with recent change in treatment.  She reports doing well but admits that injection site slightly sore.  She knows how to reach [place] if needed & knows her next appts.  She is waiting on someone to let her know if next treatment can be done at [facility].  She will call if she doesn't hear anything in next 2 wks.

## 2023-05-17 NOTE — Telephone Encounter (Signed)
-----   Message from Nurse Dillard Essex sent at 05/16/2023  4:19 PM EST ----- Regarding: Dr. Mosetta Putt Pt. first time Herceptin Hylecta (Has had trastuzumab before). Tolerated well, no issues noted

## 2023-05-18 ENCOUNTER — Ambulatory Visit: Payer: Medicare Other

## 2023-05-19 ENCOUNTER — Other Ambulatory Visit: Payer: Self-pay | Admitting: *Deleted

## 2023-05-19 ENCOUNTER — Other Ambulatory Visit: Payer: Medicare Other

## 2023-05-19 ENCOUNTER — Ambulatory Visit: Payer: Medicare Other | Admitting: Nurse Practitioner

## 2023-05-19 ENCOUNTER — Ambulatory Visit: Payer: Medicare Other

## 2023-05-19 DIAGNOSIS — Z170 Estrogen receptor positive status [ER+]: Secondary | ICD-10-CM

## 2023-05-23 ENCOUNTER — Ambulatory Visit
Admission: RE | Admit: 2023-05-23 | Discharge: 2023-05-23 | Disposition: A | Discharge status: Home / Self Care | Payer: Medicare Other | Source: Ambulatory Visit | Attending: Radiation Oncology | Admitting: Radiation Oncology

## 2023-05-23 DIAGNOSIS — Z510 Encounter for antineoplastic radiation therapy: Secondary | ICD-10-CM | POA: Diagnosis not present

## 2023-05-24 ENCOUNTER — Ambulatory Visit
Admission: RE | Admit: 2023-05-24 | Discharge: 2023-05-24 | Disposition: A | Discharge status: Home / Self Care | Payer: Medicare Other | Source: Ambulatory Visit | Attending: Radiation Oncology | Admitting: Radiation Oncology

## 2023-05-24 ENCOUNTER — Other Ambulatory Visit: Payer: Self-pay

## 2023-05-24 DIAGNOSIS — Z510 Encounter for antineoplastic radiation therapy: Secondary | ICD-10-CM | POA: Diagnosis not present

## 2023-05-24 LAB — RAD ONC ARIA SESSION SUMMARY
Course Elapsed Days: 0
Plan Fractions Treated to Date: 1
Plan Prescribed Dose Per Fraction: 2.66 Gy
Plan Total Fractions Prescribed: 16
Plan Total Prescribed Dose: 42.56 Gy
Reference Point Dosage Given to Date: 2.66 Gy
Reference Point Session Dosage Given: 2.66 Gy
Session Number: 1

## 2023-05-25 ENCOUNTER — Ambulatory Visit
Admission: RE | Admit: 2023-05-25 | Discharge: 2023-05-25 | Disposition: A | Discharge status: Home / Self Care | Payer: Medicare Other | Source: Ambulatory Visit | Attending: Radiation Oncology | Admitting: Radiation Oncology

## 2023-05-25 ENCOUNTER — Other Ambulatory Visit: Payer: Self-pay

## 2023-05-25 DIAGNOSIS — Z510 Encounter for antineoplastic radiation therapy: Secondary | ICD-10-CM | POA: Diagnosis not present

## 2023-05-25 LAB — RAD ONC ARIA SESSION SUMMARY
Course Elapsed Days: 1
Plan Fractions Treated to Date: 2
Plan Prescribed Dose Per Fraction: 2.66 Gy
Plan Total Fractions Prescribed: 16
Plan Total Prescribed Dose: 42.56 Gy
Reference Point Dosage Given to Date: 5.32 Gy
Reference Point Session Dosage Given: 2.66 Gy
Session Number: 2

## 2023-05-26 ENCOUNTER — Other Ambulatory Visit: Payer: Self-pay | Admitting: Cardiovascular Disease

## 2023-05-26 ENCOUNTER — Other Ambulatory Visit: Payer: Self-pay

## 2023-05-26 ENCOUNTER — Ambulatory Visit
Admission: RE | Admit: 2023-05-26 | Discharge: 2023-05-26 | Disposition: A | Discharge status: Home / Self Care | Payer: Medicare Other | Source: Ambulatory Visit | Attending: Radiation Oncology | Admitting: Radiation Oncology

## 2023-05-26 DIAGNOSIS — Z510 Encounter for antineoplastic radiation therapy: Secondary | ICD-10-CM | POA: Diagnosis not present

## 2023-05-26 DIAGNOSIS — I493 Ventricular premature depolarization: Secondary | ICD-10-CM

## 2023-05-26 LAB — RAD ONC ARIA SESSION SUMMARY
Course Elapsed Days: 2
Plan Fractions Treated to Date: 3
Plan Prescribed Dose Per Fraction: 2.66 Gy
Plan Total Fractions Prescribed: 16
Plan Total Prescribed Dose: 42.56 Gy
Reference Point Dosage Given to Date: 7.98 Gy
Reference Point Session Dosage Given: 2.66 Gy
Session Number: 3

## 2023-05-27 ENCOUNTER — Other Ambulatory Visit: Payer: Self-pay

## 2023-05-27 ENCOUNTER — Ambulatory Visit
Admission: RE | Admit: 2023-05-27 | Discharge: 2023-05-27 | Disposition: A | Discharge status: Home / Self Care | Payer: Medicare Other | Source: Ambulatory Visit | Attending: Radiation Oncology | Admitting: Radiation Oncology

## 2023-05-27 DIAGNOSIS — Z510 Encounter for antineoplastic radiation therapy: Secondary | ICD-10-CM | POA: Diagnosis not present

## 2023-05-27 LAB — RAD ONC ARIA SESSION SUMMARY
Course Elapsed Days: 3
Plan Fractions Treated to Date: 4
Plan Prescribed Dose Per Fraction: 2.66 Gy
Plan Total Fractions Prescribed: 16
Plan Total Prescribed Dose: 42.56 Gy
Reference Point Dosage Given to Date: 10.64 Gy
Reference Point Session Dosage Given: 2.66 Gy
Session Number: 4

## 2023-05-30 ENCOUNTER — Ambulatory Visit
Admission: RE | Admit: 2023-05-30 | Discharge: 2023-05-30 | Disposition: A | Discharge status: Home / Self Care | Payer: Medicare Other | Source: Ambulatory Visit | Attending: Radiation Oncology | Admitting: Radiation Oncology

## 2023-05-30 ENCOUNTER — Other Ambulatory Visit: Payer: Self-pay

## 2023-05-30 DIAGNOSIS — D0511 Intraductal carcinoma in situ of right breast: Secondary | ICD-10-CM | POA: Diagnosis present

## 2023-05-30 DIAGNOSIS — Z510 Encounter for antineoplastic radiation therapy: Secondary | ICD-10-CM | POA: Insufficient documentation

## 2023-05-30 DIAGNOSIS — Z1722 Progesterone receptor negative status: Secondary | ICD-10-CM | POA: Diagnosis not present

## 2023-05-30 DIAGNOSIS — Z170 Estrogen receptor positive status [ER+]: Secondary | ICD-10-CM | POA: Diagnosis not present

## 2023-05-30 LAB — RAD ONC ARIA SESSION SUMMARY
Course Elapsed Days: 6
Plan Fractions Treated to Date: 5
Plan Prescribed Dose Per Fraction: 2.66 Gy
Plan Total Fractions Prescribed: 16
Plan Total Prescribed Dose: 42.56 Gy
Reference Point Dosage Given to Date: 13.3 Gy
Reference Point Session Dosage Given: 2.66 Gy
Session Number: 5

## 2023-05-31 ENCOUNTER — Ambulatory Visit
Admission: RE | Admit: 2023-05-31 | Discharge: 2023-05-31 | Disposition: A | Discharge status: Home / Self Care | Payer: Medicare Other | Source: Ambulatory Visit | Attending: Radiation Oncology | Admitting: Radiation Oncology

## 2023-05-31 ENCOUNTER — Other Ambulatory Visit: Payer: Self-pay

## 2023-05-31 ENCOUNTER — Encounter: Payer: Self-pay | Admitting: Hematology

## 2023-05-31 DIAGNOSIS — Z510 Encounter for antineoplastic radiation therapy: Secondary | ICD-10-CM | POA: Diagnosis not present

## 2023-05-31 LAB — RAD ONC ARIA SESSION SUMMARY
Course Elapsed Days: 7
Plan Fractions Treated to Date: 6
Plan Prescribed Dose Per Fraction: 2.66 Gy
Plan Total Fractions Prescribed: 16
Plan Total Prescribed Dose: 42.56 Gy
Reference Point Dosage Given to Date: 15.96 Gy
Reference Point Session Dosage Given: 2.66 Gy
Session Number: 6

## 2023-06-01 ENCOUNTER — Ambulatory Visit
Admission: RE | Admit: 2023-06-01 | Discharge: 2023-06-01 | Disposition: A | Discharge status: Home / Self Care | Payer: Medicare Other | Source: Ambulatory Visit | Attending: Radiation Oncology | Admitting: Radiation Oncology

## 2023-06-01 ENCOUNTER — Inpatient Hospital Stay: Payer: Medicare Other

## 2023-06-01 ENCOUNTER — Other Ambulatory Visit: Payer: Self-pay

## 2023-06-01 DIAGNOSIS — Z170 Estrogen receptor positive status [ER+]: Secondary | ICD-10-CM | POA: Insufficient documentation

## 2023-06-01 DIAGNOSIS — C50411 Malignant neoplasm of upper-outer quadrant of right female breast: Secondary | ICD-10-CM | POA: Insufficient documentation

## 2023-06-01 DIAGNOSIS — Z1732 Human epidermal growth factor receptor 2 negative status: Secondary | ICD-10-CM | POA: Insufficient documentation

## 2023-06-01 DIAGNOSIS — Z1721 Progesterone receptor positive status: Secondary | ICD-10-CM | POA: Insufficient documentation

## 2023-06-01 DIAGNOSIS — Z5112 Encounter for antineoplastic immunotherapy: Secondary | ICD-10-CM | POA: Insufficient documentation

## 2023-06-01 DIAGNOSIS — Z510 Encounter for antineoplastic radiation therapy: Secondary | ICD-10-CM | POA: Diagnosis not present

## 2023-06-01 LAB — CBC (CANCER CENTER ONLY)
HCT: 40.4 % (ref 36.0–46.0)
Hemoglobin: 13.3 g/dL (ref 12.0–15.0)
MCH: 28.3 pg (ref 26.0–34.0)
MCHC: 32.9 g/dL (ref 30.0–36.0)
MCV: 86 fL (ref 80.0–100.0)
Platelet Count: 340 10*3/uL (ref 150–400)
RBC: 4.7 MIL/uL (ref 3.87–5.11)
RDW: 14 % (ref 11.5–15.5)
WBC Count: 5.2 10*3/uL (ref 4.0–10.5)
nRBC: 0 % (ref 0.0–0.2)

## 2023-06-01 LAB — RAD ONC ARIA SESSION SUMMARY
Course Elapsed Days: 8
Plan Fractions Treated to Date: 7
Plan Prescribed Dose Per Fraction: 2.66 Gy
Plan Total Fractions Prescribed: 16
Plan Total Prescribed Dose: 42.56 Gy
Reference Point Dosage Given to Date: 18.62 Gy
Reference Point Session Dosage Given: 2.66 Gy
Session Number: 7

## 2023-06-02 ENCOUNTER — Other Ambulatory Visit: Payer: Self-pay

## 2023-06-02 ENCOUNTER — Ambulatory Visit
Admission: RE | Admit: 2023-06-02 | Discharge: 2023-06-02 | Disposition: A | Discharge status: Home / Self Care | Payer: Medicare Other | Source: Ambulatory Visit | Attending: Radiation Oncology | Admitting: Radiation Oncology

## 2023-06-02 DIAGNOSIS — Z510 Encounter for antineoplastic radiation therapy: Secondary | ICD-10-CM | POA: Diagnosis not present

## 2023-06-02 LAB — RAD ONC ARIA SESSION SUMMARY
Course Elapsed Days: 9
Plan Fractions Treated to Date: 8
Plan Prescribed Dose Per Fraction: 2.66 Gy
Plan Total Fractions Prescribed: 16
Plan Total Prescribed Dose: 42.56 Gy
Reference Point Dosage Given to Date: 21.28 Gy
Reference Point Session Dosage Given: 2.66 Gy
Session Number: 8

## 2023-06-03 ENCOUNTER — Other Ambulatory Visit: Payer: Self-pay

## 2023-06-03 ENCOUNTER — Ambulatory Visit
Admission: RE | Admit: 2023-06-03 | Discharge: 2023-06-03 | Disposition: A | Discharge status: Home / Self Care | Payer: Medicare Other | Source: Ambulatory Visit | Attending: Radiation Oncology | Admitting: Radiation Oncology

## 2023-06-03 DIAGNOSIS — Z510 Encounter for antineoplastic radiation therapy: Secondary | ICD-10-CM | POA: Diagnosis not present

## 2023-06-03 LAB — RAD ONC ARIA SESSION SUMMARY
Course Elapsed Days: 10
Plan Fractions Treated to Date: 9
Plan Prescribed Dose Per Fraction: 2.66 Gy
Plan Total Fractions Prescribed: 16
Plan Total Prescribed Dose: 42.56 Gy
Reference Point Dosage Given to Date: 23.94 Gy
Reference Point Session Dosage Given: 2.66 Gy
Session Number: 9

## 2023-06-06 ENCOUNTER — Other Ambulatory Visit: Payer: Self-pay

## 2023-06-06 ENCOUNTER — Ambulatory Visit
Admission: RE | Admit: 2023-06-06 | Discharge: 2023-06-06 | Disposition: A | Discharge status: Home / Self Care | Payer: Medicare Other | Source: Ambulatory Visit | Attending: Radiation Oncology | Admitting: Radiation Oncology

## 2023-06-06 DIAGNOSIS — Z510 Encounter for antineoplastic radiation therapy: Secondary | ICD-10-CM | POA: Diagnosis not present

## 2023-06-06 LAB — RAD ONC ARIA SESSION SUMMARY
Course Elapsed Days: 13
Plan Fractions Treated to Date: 10
Plan Prescribed Dose Per Fraction: 2.66 Gy
Plan Total Fractions Prescribed: 16
Plan Total Prescribed Dose: 42.56 Gy
Reference Point Dosage Given to Date: 26.6 Gy
Reference Point Session Dosage Given: 2.66 Gy
Session Number: 10

## 2023-06-07 ENCOUNTER — Ambulatory Visit: Payer: Medicare Other | Admitting: Nurse Practitioner

## 2023-06-07 ENCOUNTER — Other Ambulatory Visit: Payer: Medicare Other

## 2023-06-07 ENCOUNTER — Other Ambulatory Visit: Payer: Self-pay

## 2023-06-07 ENCOUNTER — Inpatient Hospital Stay: Payer: Medicare Other

## 2023-06-07 ENCOUNTER — Ambulatory Visit
Admission: RE | Admit: 2023-06-07 | Discharge: 2023-06-07 | Disposition: A | Discharge status: Home / Self Care | Payer: Medicare Other | Source: Ambulatory Visit | Attending: Radiation Oncology | Admitting: Radiation Oncology

## 2023-06-07 ENCOUNTER — Ambulatory Visit: Payer: Medicare Other

## 2023-06-07 VITALS — BP 130/63 | HR 61 | Temp 96.9°F | Resp 18

## 2023-06-07 DIAGNOSIS — Z510 Encounter for antineoplastic radiation therapy: Secondary | ICD-10-CM | POA: Diagnosis not present

## 2023-06-07 DIAGNOSIS — Z170 Estrogen receptor positive status [ER+]: Secondary | ICD-10-CM

## 2023-06-07 LAB — RAD ONC ARIA SESSION SUMMARY
Course Elapsed Days: 14
Plan Fractions Treated to Date: 11
Plan Prescribed Dose Per Fraction: 2.66 Gy
Plan Total Fractions Prescribed: 16
Plan Total Prescribed Dose: 42.56 Gy
Reference Point Dosage Given to Date: 29.26 Gy
Reference Point Session Dosage Given: 2.66 Gy
Session Number: 11

## 2023-06-07 MED ORDER — ACETAMINOPHEN 325 MG PO TABS
650.0000 mg | ORAL_TABLET | Freq: Once | ORAL | Status: AC
  Administered 2023-06-07: 650 mg via ORAL
  Filled 2023-06-07: qty 2

## 2023-06-07 MED ORDER — TRASTUZUMAB-HYALURONIDASE-OYSK 600-10000 MG-UNT/5ML ~~LOC~~ SOLN
600.0000 mg | Freq: Once | SUBCUTANEOUS | Status: AC
  Administered 2023-06-07: 600 mg via SUBCUTANEOUS
  Filled 2023-06-07: qty 5

## 2023-06-07 MED ORDER — DIPHENHYDRAMINE HCL 25 MG PO CAPS
25.0000 mg | ORAL_CAPSULE | Freq: Once | ORAL | Status: AC
  Administered 2023-06-07: 25 mg via ORAL
  Filled 2023-06-07: qty 1

## 2023-06-07 NOTE — Patient Instructions (Signed)

## 2023-06-08 ENCOUNTER — Ambulatory Visit
Admission: RE | Admit: 2023-06-08 | Discharge: 2023-06-08 | Disposition: A | Discharge status: Home / Self Care | Payer: Medicare Other | Source: Ambulatory Visit | Attending: Radiation Oncology | Admitting: Radiation Oncology

## 2023-06-08 ENCOUNTER — Other Ambulatory Visit: Payer: Self-pay

## 2023-06-08 DIAGNOSIS — Z510 Encounter for antineoplastic radiation therapy: Secondary | ICD-10-CM | POA: Diagnosis not present

## 2023-06-08 LAB — RAD ONC ARIA SESSION SUMMARY
Course Elapsed Days: 15
Plan Fractions Treated to Date: 12
Plan Prescribed Dose Per Fraction: 2.66 Gy
Plan Total Fractions Prescribed: 16
Plan Total Prescribed Dose: 42.56 Gy
Reference Point Dosage Given to Date: 31.92 Gy
Reference Point Session Dosage Given: 2.66 Gy
Session Number: 12

## 2023-06-09 ENCOUNTER — Ambulatory Visit
Admission: RE | Admit: 2023-06-09 | Discharge: 2023-06-09 | Disposition: A | Discharge status: Home / Self Care | Payer: Medicare Other | Source: Ambulatory Visit | Attending: Radiation Oncology | Admitting: Radiation Oncology

## 2023-06-09 ENCOUNTER — Other Ambulatory Visit: Payer: Self-pay

## 2023-06-09 DIAGNOSIS — Z510 Encounter for antineoplastic radiation therapy: Secondary | ICD-10-CM | POA: Diagnosis not present

## 2023-06-09 LAB — RAD ONC ARIA SESSION SUMMARY
Course Elapsed Days: 16
Plan Fractions Treated to Date: 13
Plan Prescribed Dose Per Fraction: 2.66 Gy
Plan Total Fractions Prescribed: 16
Plan Total Prescribed Dose: 42.56 Gy
Reference Point Dosage Given to Date: 34.58 Gy
Reference Point Session Dosage Given: 2.66 Gy
Session Number: 13

## 2023-06-10 ENCOUNTER — Other Ambulatory Visit: Payer: Self-pay

## 2023-06-10 ENCOUNTER — Ambulatory Visit
Admission: RE | Admit: 2023-06-10 | Discharge: 2023-06-10 | Disposition: A | Discharge status: Home / Self Care | Payer: Medicare Other | Source: Ambulatory Visit | Attending: Radiation Oncology | Admitting: Radiation Oncology

## 2023-06-10 DIAGNOSIS — Z510 Encounter for antineoplastic radiation therapy: Secondary | ICD-10-CM | POA: Diagnosis not present

## 2023-06-10 LAB — RAD ONC ARIA SESSION SUMMARY
Course Elapsed Days: 17
Plan Fractions Treated to Date: 14
Plan Prescribed Dose Per Fraction: 2.66 Gy
Plan Total Fractions Prescribed: 16
Plan Total Prescribed Dose: 42.56 Gy
Reference Point Dosage Given to Date: 37.24 Gy
Reference Point Session Dosage Given: 2.66 Gy
Session Number: 14

## 2023-06-13 ENCOUNTER — Other Ambulatory Visit: Payer: Self-pay

## 2023-06-13 ENCOUNTER — Ambulatory Visit
Admission: RE | Admit: 2023-06-13 | Discharge: 2023-06-13 | Disposition: A | Discharge status: Home / Self Care | Payer: Medicare Other | Source: Ambulatory Visit | Attending: Radiation Oncology | Admitting: Radiation Oncology

## 2023-06-13 DIAGNOSIS — Z510 Encounter for antineoplastic radiation therapy: Secondary | ICD-10-CM | POA: Diagnosis not present

## 2023-06-13 LAB — RAD ONC ARIA SESSION SUMMARY
Course Elapsed Days: 20
Plan Fractions Treated to Date: 15
Plan Prescribed Dose Per Fraction: 2.66 Gy
Plan Total Fractions Prescribed: 16
Plan Total Prescribed Dose: 42.56 Gy
Reference Point Dosage Given to Date: 39.9 Gy
Reference Point Session Dosage Given: 2.66 Gy
Session Number: 15

## 2023-06-14 ENCOUNTER — Ambulatory Visit
Admission: RE | Admit: 2023-06-14 | Discharge: 2023-06-14 | Disposition: A | Discharge status: Home / Self Care | Payer: Medicare Other | Source: Ambulatory Visit | Attending: Radiation Oncology | Admitting: Radiation Oncology

## 2023-06-14 ENCOUNTER — Other Ambulatory Visit: Payer: Self-pay

## 2023-06-14 DIAGNOSIS — Z510 Encounter for antineoplastic radiation therapy: Secondary | ICD-10-CM | POA: Diagnosis not present

## 2023-06-14 LAB — RAD ONC ARIA SESSION SUMMARY
Course Elapsed Days: 21
Plan Fractions Treated to Date: 16
Plan Prescribed Dose Per Fraction: 2.66 Gy
Plan Total Fractions Prescribed: 16
Plan Total Prescribed Dose: 42.56 Gy
Reference Point Dosage Given to Date: 42.56 Gy
Reference Point Session Dosage Given: 2.66 Gy
Session Number: 16

## 2023-06-15 ENCOUNTER — Ambulatory Visit
Admission: RE | Admit: 2023-06-15 | Discharge: 2023-06-15 | Disposition: A | Discharge status: Home / Self Care | Payer: Medicare Other | Source: Ambulatory Visit | Attending: Radiation Oncology | Admitting: Radiation Oncology

## 2023-06-15 ENCOUNTER — Other Ambulatory Visit: Payer: Self-pay

## 2023-06-15 ENCOUNTER — Inpatient Hospital Stay: Payer: Medicare Other

## 2023-06-15 DIAGNOSIS — C50411 Malignant neoplasm of upper-outer quadrant of right female breast: Secondary | ICD-10-CM

## 2023-06-15 DIAGNOSIS — Z510 Encounter for antineoplastic radiation therapy: Secondary | ICD-10-CM | POA: Diagnosis not present

## 2023-06-15 LAB — RAD ONC ARIA SESSION SUMMARY
Course Elapsed Days: 22
Plan Fractions Treated to Date: 1
Plan Prescribed Dose Per Fraction: 2 Gy
Plan Total Fractions Prescribed: 8
Plan Total Prescribed Dose: 16 Gy
Reference Point Dosage Given to Date: 2 Gy
Reference Point Session Dosage Given: 2 Gy
Session Number: 17

## 2023-06-15 LAB — CBC (CANCER CENTER ONLY)
HCT: 38.1 % (ref 36.0–46.0)
Hemoglobin: 12.7 g/dL (ref 12.0–15.0)
MCH: 28 pg (ref 26.0–34.0)
MCHC: 33.3 g/dL (ref 30.0–36.0)
MCV: 84.1 fL (ref 80.0–100.0)
Platelet Count: 318 10*3/uL (ref 150–400)
RBC: 4.53 MIL/uL (ref 3.87–5.11)
RDW: 14 % (ref 11.5–15.5)
WBC Count: 4.6 10*3/uL (ref 4.0–10.5)
nRBC: 0 % (ref 0.0–0.2)

## 2023-06-16 ENCOUNTER — Other Ambulatory Visit: Payer: Self-pay

## 2023-06-16 ENCOUNTER — Ambulatory Visit
Admission: RE | Admit: 2023-06-16 | Discharge: 2023-06-16 | Disposition: A | Discharge status: Home / Self Care | Payer: Medicare Other | Source: Ambulatory Visit | Attending: Radiation Oncology | Admitting: Radiation Oncology

## 2023-06-16 DIAGNOSIS — Z510 Encounter for antineoplastic radiation therapy: Secondary | ICD-10-CM | POA: Diagnosis not present

## 2023-06-16 LAB — RAD ONC ARIA SESSION SUMMARY
Course Elapsed Days: 23
Plan Fractions Treated to Date: 2
Plan Prescribed Dose Per Fraction: 2 Gy
Plan Total Fractions Prescribed: 8
Plan Total Prescribed Dose: 16 Gy
Reference Point Dosage Given to Date: 4 Gy
Reference Point Session Dosage Given: 2 Gy
Session Number: 18

## 2023-06-17 ENCOUNTER — Ambulatory Visit
Admission: RE | Admit: 2023-06-17 | Discharge: 2023-06-17 | Disposition: A | Discharge status: Home / Self Care | Payer: Medicare Other | Source: Ambulatory Visit | Attending: Radiation Oncology | Admitting: Radiation Oncology

## 2023-06-17 ENCOUNTER — Other Ambulatory Visit: Payer: Self-pay

## 2023-06-17 DIAGNOSIS — Z510 Encounter for antineoplastic radiation therapy: Secondary | ICD-10-CM | POA: Diagnosis not present

## 2023-06-17 LAB — RAD ONC ARIA SESSION SUMMARY
Course Elapsed Days: 24
Plan Fractions Treated to Date: 3
Plan Prescribed Dose Per Fraction: 2 Gy
Plan Total Fractions Prescribed: 8
Plan Total Prescribed Dose: 16 Gy
Reference Point Dosage Given to Date: 6 Gy
Reference Point Session Dosage Given: 2 Gy
Session Number: 19

## 2023-06-20 ENCOUNTER — Other Ambulatory Visit: Payer: Self-pay

## 2023-06-20 ENCOUNTER — Ambulatory Visit
Admission: RE | Admit: 2023-06-20 | Discharge: 2023-06-20 | Disposition: A | Discharge status: Home / Self Care | Payer: Medicare Other | Source: Ambulatory Visit | Attending: Radiation Oncology | Admitting: Radiation Oncology

## 2023-06-20 DIAGNOSIS — Z510 Encounter for antineoplastic radiation therapy: Secondary | ICD-10-CM | POA: Diagnosis not present

## 2023-06-20 LAB — RAD ONC ARIA SESSION SUMMARY
Course Elapsed Days: 27
Plan Fractions Treated to Date: 4
Plan Prescribed Dose Per Fraction: 2 Gy
Plan Total Fractions Prescribed: 8
Plan Total Prescribed Dose: 16 Gy
Reference Point Dosage Given to Date: 8 Gy
Reference Point Session Dosage Given: 2 Gy
Session Number: 20

## 2023-06-21 ENCOUNTER — Ambulatory Visit
Admission: RE | Admit: 2023-06-21 | Discharge: 2023-06-21 | Disposition: A | Discharge status: Home / Self Care | Payer: Medicare Other | Source: Ambulatory Visit | Attending: Radiation Oncology | Admitting: Radiation Oncology

## 2023-06-21 ENCOUNTER — Other Ambulatory Visit: Payer: Self-pay

## 2023-06-21 DIAGNOSIS — Z510 Encounter for antineoplastic radiation therapy: Secondary | ICD-10-CM | POA: Diagnosis not present

## 2023-06-21 LAB — RAD ONC ARIA SESSION SUMMARY
Course Elapsed Days: 28
Plan Fractions Treated to Date: 5
Plan Prescribed Dose Per Fraction: 2 Gy
Plan Total Fractions Prescribed: 8
Plan Total Prescribed Dose: 16 Gy
Reference Point Dosage Given to Date: 10 Gy
Reference Point Session Dosage Given: 2 Gy
Session Number: 21

## 2023-06-22 ENCOUNTER — Ambulatory Visit
Admission: RE | Admit: 2023-06-22 | Discharge: 2023-06-22 | Disposition: A | Discharge status: Home / Self Care | Payer: Medicare Other | Source: Ambulatory Visit | Attending: Radiation Oncology | Admitting: Radiation Oncology

## 2023-06-22 ENCOUNTER — Other Ambulatory Visit: Payer: Self-pay

## 2023-06-22 DIAGNOSIS — Z510 Encounter for antineoplastic radiation therapy: Secondary | ICD-10-CM | POA: Diagnosis not present

## 2023-06-22 LAB — RAD ONC ARIA SESSION SUMMARY
Course Elapsed Days: 29
Plan Fractions Treated to Date: 6
Plan Prescribed Dose Per Fraction: 2 Gy
Plan Total Fractions Prescribed: 8
Plan Total Prescribed Dose: 16 Gy
Reference Point Dosage Given to Date: 12 Gy
Reference Point Session Dosage Given: 2 Gy
Session Number: 22

## 2023-06-23 ENCOUNTER — Other Ambulatory Visit: Payer: Self-pay

## 2023-06-23 ENCOUNTER — Ambulatory Visit
Admission: RE | Admit: 2023-06-23 | Discharge: 2023-06-23 | Disposition: A | Discharge status: Home / Self Care | Payer: Medicare Other | Source: Ambulatory Visit | Attending: Radiation Oncology | Admitting: Radiation Oncology

## 2023-06-23 DIAGNOSIS — Z510 Encounter for antineoplastic radiation therapy: Secondary | ICD-10-CM | POA: Diagnosis not present

## 2023-06-23 LAB — RAD ONC ARIA SESSION SUMMARY
Course Elapsed Days: 30
Plan Fractions Treated to Date: 7
Plan Prescribed Dose Per Fraction: 2 Gy
Plan Total Fractions Prescribed: 8
Plan Total Prescribed Dose: 16 Gy
Reference Point Dosage Given to Date: 14 Gy
Reference Point Session Dosage Given: 2 Gy
Session Number: 23

## 2023-06-24 ENCOUNTER — Ambulatory Visit

## 2023-06-24 ENCOUNTER — Ambulatory Visit: Payer: Medicare Other

## 2023-06-25 ENCOUNTER — Other Ambulatory Visit: Payer: Self-pay

## 2023-06-27 ENCOUNTER — Other Ambulatory Visit: Payer: Self-pay

## 2023-06-27 ENCOUNTER — Ambulatory Visit
Admission: RE | Admit: 2023-06-27 | Discharge: 2023-06-27 | Disposition: A | Discharge status: Home / Self Care | Source: Ambulatory Visit | Attending: Radiation Oncology | Admitting: Radiation Oncology

## 2023-06-27 ENCOUNTER — Ambulatory Visit

## 2023-06-27 DIAGNOSIS — Z510 Encounter for antineoplastic radiation therapy: Secondary | ICD-10-CM | POA: Diagnosis not present

## 2023-06-27 LAB — RAD ONC ARIA SESSION SUMMARY
Course Elapsed Days: 34
Plan Fractions Treated to Date: 8
Plan Prescribed Dose Per Fraction: 2 Gy
Plan Total Fractions Prescribed: 8
Plan Total Prescribed Dose: 16 Gy
Reference Point Dosage Given to Date: 16 Gy
Reference Point Session Dosage Given: 2 Gy
Session Number: 24

## 2023-06-28 ENCOUNTER — Ambulatory Visit: Payer: Medicare Other

## 2023-06-28 ENCOUNTER — Other Ambulatory Visit: Payer: Medicare Other

## 2023-06-28 ENCOUNTER — Ambulatory Visit: Payer: Medicare Other | Admitting: Hematology

## 2023-06-28 ENCOUNTER — Encounter: Payer: Self-pay | Admitting: *Deleted

## 2023-06-28 ENCOUNTER — Ambulatory Visit

## 2023-06-28 ENCOUNTER — Inpatient Hospital Stay: Payer: Medicare Other | Attending: Hematology

## 2023-06-28 VITALS — BP 139/85 | HR 99 | Temp 97.8°F | Resp 18

## 2023-06-28 DIAGNOSIS — Z1722 Progesterone receptor negative status: Secondary | ICD-10-CM | POA: Insufficient documentation

## 2023-06-28 DIAGNOSIS — C50411 Malignant neoplasm of upper-outer quadrant of right female breast: Secondary | ICD-10-CM | POA: Insufficient documentation

## 2023-06-28 DIAGNOSIS — Z170 Estrogen receptor positive status [ER+]: Secondary | ICD-10-CM | POA: Insufficient documentation

## 2023-06-28 DIAGNOSIS — Z1732 Human epidermal growth factor receptor 2 negative status: Secondary | ICD-10-CM | POA: Diagnosis not present

## 2023-06-28 DIAGNOSIS — Z5112 Encounter for antineoplastic immunotherapy: Secondary | ICD-10-CM | POA: Insufficient documentation

## 2023-06-28 MED ORDER — TRASTUZUMAB-HYALURONIDASE-OYSK 600-10000 MG-UNT/5ML ~~LOC~~ SOLN
600.0000 mg | Freq: Once | SUBCUTANEOUS | Status: AC
  Administered 2023-06-28: 600 mg via SUBCUTANEOUS
  Filled 2023-06-28: qty 5

## 2023-06-28 MED ORDER — ACETAMINOPHEN 325 MG PO TABS
650.0000 mg | ORAL_TABLET | Freq: Once | ORAL | Status: AC
Start: 2023-06-28 — End: 2023-06-28
  Administered 2023-06-28: 650 mg via ORAL
  Filled 2023-06-28: qty 2

## 2023-06-28 MED ORDER — DIPHENHYDRAMINE HCL 25 MG PO CAPS
25.0000 mg | ORAL_CAPSULE | Freq: Once | ORAL | Status: AC
Start: 2023-06-28 — End: 2023-06-28
  Administered 2023-06-28: 25 mg via ORAL
  Filled 2023-06-28: qty 1

## 2023-06-28 NOTE — Radiation Completion Notes (Signed)
 Patient Name: [name], [name] MRN: [id number] Date of Birth: 1955-04-05 Referring Physician: Malachy Mood, M.D. Date of Service: 2023-06-28 Radiation Oncologist: Carmina Miller, M.D. 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  There was an area of 1.7 cm of indeterminate calcifications in the upper outer quadrant of the right breast for which stereotactic biopsy was performed.  Biopsy was positive for invasive mammary carcinoma.  Axilla was normal by ultrasound.  She underwent a wide local excision for a 0.5 cm ER positive PR negative HER2/neu overexpressed overall grade 2 invasive mammary carcinoma.  All margins were negative for invasive carcinoma 11 mm was the closest margin.  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  Initially no lymph nodes were submitted she underwent repeat sentinel node biopsy showing 3 sentinel nodes negative for metastatic disease.  Patient has undergone Taxol therapy and currently is on trastuzumab which she is tolerating well.  She is seen today for radiation oncology opinion.  She is doing well specifically denies breast tenderness cough or bone pain.      ==========DELIVERED PLANS==========  First Treatment Date: 2023-05-24 Last Treatment Date: 2023-06-27   Plan Name: Breast_R Site: Breast, Right Technique: 3D Mode: Photon Dose Per Fraction: 2.66 Gy Prescribed Dose (Delivered / Prescribed): 42.56  Gy / 42.56 Gy Prescribed Fxs (Delivered / Prescribed): 16 / 16   Plan Name: Breast_Rt_Bst Site: Breast, Right Technique: 3D Mode: Photon Dose Per Fraction: 2 Gy Prescribed Dose (Delivered / Prescribed): 16 Gy / 16 Gy Prescribed Fxs (Delivered / Prescribed): 8 / 8     ==========ON TREATMENT VISIT DATES========== 2023-05-24, 2023-05-31, 2023-06-07, 2023-06-14, 2023-06-21     ==========UPCOMING VISITS==========       ==========APPENDIX - ON TREATMENT VISIT NOTES==========   See weekly On Treatment Notes in Epic for details in the Media tab (listed as Progress notes on the On Treatment Visit Dates listed above).

## 2023-07-02 ENCOUNTER — Other Ambulatory Visit: Payer: Self-pay

## 2023-07-04 ENCOUNTER — Other Ambulatory Visit: Payer: Self-pay

## 2023-07-07 ENCOUNTER — Ambulatory Visit: Attending: Hematology

## 2023-07-07 ENCOUNTER — Telehealth: Payer: Self-pay

## 2023-07-07 ENCOUNTER — Other Ambulatory Visit: Payer: Self-pay

## 2023-07-07 ENCOUNTER — Other Ambulatory Visit: Payer: Self-pay | Admitting: Hematology

## 2023-07-07 DIAGNOSIS — Z170 Estrogen receptor positive status [ER+]: Secondary | ICD-10-CM

## 2023-07-07 DIAGNOSIS — D0511 Intraductal carcinoma in situ of right breast: Secondary | ICD-10-CM

## 2023-07-07 DIAGNOSIS — Z95828 Presence of other vascular implants and grafts: Secondary | ICD-10-CM

## 2023-07-07 DIAGNOSIS — Z0189 Encounter for other specified special examinations: Secondary | ICD-10-CM

## 2023-07-07 DIAGNOSIS — C50411 Malignant neoplasm of upper-outer quadrant of right female breast: Secondary | ICD-10-CM | POA: Diagnosis not present

## 2023-07-07 LAB — ECHOCARDIOGRAM LIMITED
AV Mean grad: 6 mmHg
AV Peak grad: 10.5 mmHg
Ao pk vel: 1.62 m/s
Area-P 1/2: 5.84 cm2
S' Lateral: 4.15 cm
Single Plane A4C EF: 41.6 %

## 2023-07-07 MED ORDER — PERFLUTREN LIPID MICROSPHERE
1.0000 mL | INTRAVENOUS | Status: AC | PRN
Start: 2023-07-07 — End: 2023-07-07
  Administered 2023-07-07: 3 mL via INTRAVENOUS

## 2023-07-07 NOTE — Telephone Encounter (Signed)
 Pt called requesting referral to Dr. Dwain Sarna for port removal now since pt has completed radiation.  Pt also stated that it's also time to have another Echocardiogram.  Stated this nurse will speak with Dr. Mosetta Putt regarding pt's request and follow-up with pt.

## 2023-07-09 ENCOUNTER — Other Ambulatory Visit: Payer: Self-pay

## 2023-07-14 ENCOUNTER — Inpatient Hospital Stay: Payer: Medicare Other | Admitting: *Deleted

## 2023-07-18 NOTE — Assessment & Plan Note (Signed)
 [id number], stage IA, G2, HER2 positive, ER positive, PR negative, HER2+, and DCIS(+) It was discovered on screening mammogram, initial biopsy showed DCIS only.   -Status post a right lumpectomy.  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 Margins are negative for invasive cancer, but DCIS margin was close. No lymphovascular or perineural invasion. Lymph node biopsy was not performed, Dr. Dwain Sarna has ordered an ultrasound of the right axilla for further evaluation.  If ultrasound is negative, I think it is okay not to have sentinel lymph node biopsy given the small primary tumor. -Given the aggressive nature of her HER2 positive disease, and moderate risk of recurrence, I do recommend adjuvant chemotherapy with Taxol or Abraxane once weekly for 12 weeks. -she underwent port placement and right axillary SLN biopsy on 01/12/23 and all 3 nodes were negative   -She started to weekly paclitaxel and trastuzumab on February 02, 2023, plan to continue Taxol for 3 months and trastuzumab for 1 year

## 2023-07-19 ENCOUNTER — Inpatient Hospital Stay

## 2023-07-19 ENCOUNTER — Inpatient Hospital Stay: Payer: Medicare Other

## 2023-07-19 ENCOUNTER — Inpatient Hospital Stay: Payer: Medicare Other | Attending: Hematology

## 2023-07-19 ENCOUNTER — Inpatient Hospital Stay: Payer: Medicare Other | Admitting: Hematology

## 2023-07-19 ENCOUNTER — Encounter: Payer: Self-pay | Admitting: *Deleted

## 2023-07-19 ENCOUNTER — Other Ambulatory Visit: Payer: Self-pay

## 2023-07-19 VITALS — BP 132/72 | HR 107 | Temp 98.3°F | Resp 18 | Ht 66.0 in | Wt 180.6 lb

## 2023-07-19 DIAGNOSIS — C50411 Malignant neoplasm of upper-outer quadrant of right female breast: Secondary | ICD-10-CM | POA: Diagnosis not present

## 2023-07-19 DIAGNOSIS — Z95828 Presence of other vascular implants and grafts: Secondary | ICD-10-CM

## 2023-07-19 DIAGNOSIS — Z5112 Encounter for antineoplastic immunotherapy: Secondary | ICD-10-CM | POA: Diagnosis present

## 2023-07-19 DIAGNOSIS — Z1731 Human epidermal growth factor receptor 2 positive status: Secondary | ICD-10-CM | POA: Insufficient documentation

## 2023-07-19 DIAGNOSIS — Z1722 Progesterone receptor negative status: Secondary | ICD-10-CM | POA: Insufficient documentation

## 2023-07-19 DIAGNOSIS — Z170 Estrogen receptor positive status [ER+]: Secondary | ICD-10-CM

## 2023-07-19 DIAGNOSIS — D0511 Intraductal carcinoma in situ of right breast: Secondary | ICD-10-CM

## 2023-07-19 LAB — CMP (CANCER CENTER ONLY)
ALT: 14 U/L (ref 0–44)
AST: 14 U/L — ABNORMAL LOW (ref 15–41)
Albumin: 4.2 g/dL (ref 3.5–5.0)
Alkaline Phosphatase: 71 U/L (ref 38–126)
Anion gap: 8 (ref 5–15)
BUN: 10 mg/dL (ref 8–23)
CO2: 28 mmol/L (ref 22–32)
Calcium: 9.5 mg/dL (ref 8.9–10.3)
Chloride: 101 mmol/L (ref 98–111)
Creatinine: 0.57 mg/dL (ref 0.44–1.00)
GFR, Estimated: >60 mL/min (ref >60)
Glucose, Bld: 189 mg/dL — ABNORMAL HIGH (ref 70–99)
Potassium: 3.9 mmol/L (ref 3.5–5.1)
Sodium: 137 mmol/L (ref 135–145)
Total Bilirubin: 0.4 mg/dL (ref 0.0–1.2)
Total Protein: 6.8 g/dL (ref 6.5–8.1)

## 2023-07-19 LAB — CBC WITH DIFFERENTIAL (CANCER CENTER ONLY)
Abs Immature Granulocytes: 0.04 10*3/uL (ref 0.00–0.07)
Basophils Absolute: 0.1 10*3/uL (ref 0.0–0.1)
Basophils Relative: 1 %
Eosinophils Absolute: 0.2 10*3/uL (ref 0.0–0.5)
Eosinophils Relative: 4 %
HCT: 37.4 % (ref 36.0–46.0)
Hemoglobin: 12.4 g/dL (ref 12.0–15.0)
Immature Granulocytes: 1 %
Lymphocytes Relative: 19 %
Lymphs Abs: 1.1 10*3/uL (ref 0.7–4.0)
MCH: 26.7 pg (ref 26.0–34.0)
MCHC: 33.2 g/dL (ref 30.0–36.0)
MCV: 80.6 fL (ref 80.0–100.0)
Monocytes Absolute: 0.5 10*3/uL (ref 0.1–1.0)
Monocytes Relative: 9 %
Neutro Abs: 3.7 10*3/uL (ref 1.7–7.7)
Neutrophils Relative %: 66 %
Platelet Count: 330 10*3/uL (ref 150–400)
RBC: 4.64 MIL/uL (ref 3.87–5.11)
RDW: 14.5 % (ref 11.5–15.5)
WBC Count: 5.6 10*3/uL (ref 4.0–10.5)
nRBC: 0 % (ref 0.0–0.2)

## 2023-07-19 MED ORDER — ACETAMINOPHEN 325 MG PO TABS
650.0000 mg | ORAL_TABLET | Freq: Once | ORAL | Status: AC
  Administered 2023-07-19: 650 mg via ORAL
  Filled 2023-07-19: qty 2

## 2023-07-19 MED ORDER — TRASTUZUMAB-HYALURONIDASE-OYSK 600-10000 MG-UNT/5ML ~~LOC~~ SOLN
600.0000 mg | Freq: Once | SUBCUTANEOUS | Status: AC
Start: 2023-07-19 — End: 2023-07-19
  Administered 2023-07-19: 600 mg via SUBCUTANEOUS
  Filled 2023-07-19: qty 5

## 2023-07-19 MED ORDER — SODIUM CHLORIDE 0.9% FLUSH
10.0000 mL | Freq: Once | INTRAVENOUS | Status: AC
  Administered 2023-07-19: 10 mL

## 2023-07-19 MED ORDER — HEPARIN SOD (PORK) LOCK FLUSH 100 UNIT/ML IV SOLN
500.0000 [IU] | Freq: Once | INTRAVENOUS | Status: AC
  Administered 2023-07-19: 500 [IU]

## 2023-07-19 MED ORDER — DIPHENHYDRAMINE HCL 25 MG PO CAPS
25.0000 mg | ORAL_CAPSULE | Freq: Once | ORAL | Status: AC
  Administered 2023-07-19: 25 mg via ORAL
  Filled 2023-07-19: qty 1

## 2023-07-19 MED ORDER — ANASTROZOLE 1 MG PO TABS: 1.0000 mg | ORAL_TABLET | Freq: Every day | ORAL | 2 refills | Status: DC

## 2023-07-19 NOTE — Patient Instructions (Signed)
 [facility] - A DEPT OF [facility]  Discharge Instructions: Thank you for choosing [facility] to provide your oncology and hematology care.   If you have a lab appointment with the Cancer Center, please go directly to the Cancer Center and check in at the registration area.   Wear comfortable clothing and clothing appropriate for easy access to any Portacath or PICC line.   We strive to give you quality time with your provider. You may need to reschedule your appointment if you arrive late (15 or more minutes).  Arriving late affects you and other patients whose appointments are after yours.  Also, if you miss three or more appointments without notifying the office, you may be dismissed from the clinic at the provider's discretion.      For prescription refill requests, have your pharmacy contact our office and allow 72 hours for refills to be completed.    Today you received the following chemotherapy and/or immunotherapy agents: Herceptin Hylecta.       To help prevent nausea and vomiting after your treatment, we encourage you to take your nausea medication as directed.  BELOW ARE SYMPTOMS THAT SHOULD BE REPORTED IMMEDIATELY: *FEVER GREATER THAN 100.4 F (38 C) OR HIGHER *CHILLS OR SWEATING *NAUSEA AND VOMITING THAT IS NOT CONTROLLED WITH YOUR NAUSEA MEDICATION *UNUSUAL SHORTNESS OF BREATH *UNUSUAL BRUISING OR BLEEDING *URINARY PROBLEMS (pain or burning when urinating, or frequent urination) *BOWEL PROBLEMS (unusual diarrhea, constipation, pain near the anus) TENDERNESS IN MOUTH AND THROAT WITH OR WITHOUT PRESENCE OF ULCERS (sore throat, sores in mouth, or a toothache) UNUSUAL RASH, SWELLING OR PAIN  UNUSUAL VAGINAL DISCHARGE OR ITCHING   Items with * indicate a potential emergency and should be followed up as soon as possible or go to the Emergency Department if any problems should occur.  Please show the CHEMOTHERAPY ALERT CARD or  IMMUNOTHERAPY ALERT CARD at check-in to the Emergency Department and triage nurse.  Should you have questions after your visit or need to cancel or reschedule your appointment, please contact [facility] - A DEPT OF [facility]  Dept: [phone]  and follow the prompts.  Office hours are 8:00 a.m. to 4:30 p.m. Monday - Friday. Please note that voicemails left after 4:00 p.m. may not be returned until the following business day.  We are closed weekends and major holidays. You have access to a nurse at all times for urgent questions. Please call the main number to the clinic Dept: [phone] and follow the prompts.   For any non-urgent questions, you may also contact your provider using MyChart. We now offer e-Visits for anyone 31 and older to request care online for non-urgent symptoms. For details visit [email or web address].   Also download the MyChart app! Go to the app store, search "MyChart", open the app, select [place], and log in with your MyChart username and password.

## 2023-07-19 NOTE — Progress Notes (Signed)
 Per Dr. Maryalice Smaller, OK to treat today with latest echo showing EF 45-50% and pulse 107.

## 2023-07-19 NOTE — Progress Notes (Signed)
 [facility]   Telephone:(336) [phone] Fax:([phone]) [phone]   Clinic Follow up Note   Patient Care Team: Tisovec, Kristina Pfeiffer, MD as PCP - General (Internal Medicine) Wenona Hamilton, MD as PCP - Cardiology (Cardiology) Sonja Holley, MD as Consulting Physician (Hematology) Enid Harry, MD as Consulting Physician (General Surgery) Johna Myers, MD as Consulting Physician (Radiation Oncology) Auther Bo, RN as Oncology Nurse Navigator Alane Hsu, RN as Oncology Nurse Navigator Glenis Langdon, MD as Consulting Physician (Radiation Oncology)  Date of Service:  07/19/2023  CHIEF COMPLAINT: f/u of breast cancer  CURRENT THERAPY:  Adjuvant Herceptin  Hylecta injection every 3 weeks  Oncology History   Malignant neoplasm of upper-outer quadrant of right breast in female, estrogen receptor positive (HCC) pT1aN0M0, stage IA, G2, HER2 positive, ER positive, PR negative, HER2+, and DCIS(+) It was discovered on screening mammogram, initial biopsy showed DCIS only.   -Status post a right lumpectomy.  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 Margins are negative for invasive cancer, but DCIS margin was close. No lymphovascular or perineural invasion. Lymph node biopsy was not performed, Dr. Delane Fear has ordered an ultrasound of the right axilla for further evaluation.  If ultrasound is negative, I think it is okay not to have sentinel lymph node biopsy given the small primary tumor. -Given the aggressive nature of her HER2 positive disease, and moderate risk of recurrence, I do recommend adjuvant chemotherapy with Taxol  or Abraxane  once weekly for 12 weeks. -she underwent port placement and right axillary SLN biopsy on 01/12/23 and all 3 nodes were negative   -She started to weekly paclitaxel  and trastuzumab  on February 02, 2023, plan to continue Taxol  for 3 months and trastuzumab   for 1 year  Assessment & Plan Breast cancer Completed radiation therapy with no significant skin issues or fatigue. Continuing Herceptin  treatment. Echocardiogram shows heart function slightly below normal, consistent with previous results, possibly related to Herceptin . No shortness of breath, but occasional dizziness upon standing. Anastrozole  chosen over tamoxifen due to lower risk of blood clots and endometrial cancer. Discussed potential side effects of anastrozole , including hot flashes, joint pain, and impact on bone density. Tamoxifen could be considered if anastrozole  causes significant bone density loss or joint pain. - Continue Herceptin  treatment until end of October. - Schedule echocardiogram in three months. - Start anastrozole  in the next week or two. - Schedule diagnostic mammogram for July 20th. - Monitor for joint pain and bone density changes.  Cardiomyopathy Recent echocardiogram from July 07, 2023 showed persistent slightly below normal ER at 45-50%, unchanged from previous echocardiogram. Cardiologist agrees to continue Herceptin  despite this finding. No significant symptoms of heart failure, but occasional dizziness upon standing. Advised to ensure adequate hydration to manage dizziness. - Continue monitoring heart function with echocardiograms. - Ensure adequate hydration to manage dizziness.  Osteoporosis Osteoporosis, previously on medication but currently not taking any. Discussed potential impact of anastrozole  on bone density and the possibility of switching to tamoxifen if bone density worsens. Anastrozole  chosen due to lower risk of blood clots and endometrial cancer. Discussed a bone-targeted therapy being studied in breast cancer survivors to reduce bone metastasis risk. - Monitor bone density regularly. - Consider switching to tamoxifen if significant bone density loss or joint pain occurs. - Discuss potential bone-targeted therapy in future.  Plan - Lab and  recent echo reviewed, adequate for treatment, will proceed Herceptin  Hylecta today  and continue every 3 weeks until the end of October - Follow-up in 9 weeks - I called in anastrozole  1 mg daily for her, she will start in the next week.   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  No other deleterious variants in [facility] Common Hereditary Cancers +RNA Panel. Report date is 11/09/2022.    The [employer] Common Hereditary Cancers + RNA Panel includes sequencing, deletion/duplication, and RNA analysis of the following 48 genes: APC, ATM, AXIN2, BAP1, BARD1, BMPR1A, BRCA1, BRCA2, BRIP1, CDH1, CDK4*, CDKN2A*, CHEK2, CTNNA1, DICER1, EPCAM* (del/dup only), FH, GREM1* (promoter dup analysis only), HOXB13*, KIT*, MBD4*, MEN1, MLH1, MSH2, MSH3, MSH6, MUTYH, NF1, NTHL1, PALB2, PDGFRA*, PMS2, POLD1, POLE, PTEN, RAD51C, RAD51D, SDHA (sequencing only), SDHB, SDHC, SDHD, SMAD4, SMARCA4, STK11, TP53, TSC1, TSC2, VHL.  *Genes without RNA analysis.    12/07/2022 Cancer Staging   Staging form: Breast, AJCC 8th Edition - Pathologic stage from 12/07/2022: Stage IA (pT1a, pN0, cM0, G2, ER+, PR-, HER2+) - Signed by Sonja [place], MD on 12/20/2022 Stage prefix: Initial diagnosis Histologic grading system: 3 grade system Residual tumor (R): R0 - None   02/02/2023 -  Chemotherapy   Patient is on Treatment Plan : BREAST Abraxane  + Trastuzumab  q7d / Trastuzumab  q21d         Discussed the use of AI scribe software for clinical note transcription with the patient, who gave verbal consent to proceed.  History of Present Illness The patient, with a history of breast cancer and osteoporosis, presents for a follow-up visit after completing radiation therapy. She reports no skin problems or significant fatigue during the radiation treatment. She mentions occasional dizziness upon standing, but denies any shortness of breath. She has been on Herceptin  since November, with no reported breaks. She also mentions a history of osteoporosis, for which she was previously on medication. She is due for a mammogram in July and has an upcoming appointment with a cardiologist due to slightly below normal heart function.     All other systems were reviewed with the patient and are negative.  MEDICAL HISTORY:  Past Medical History:  Diagnosis Date   Anemia    Arthritis    Atypical chest pain    a. 05/2018 CTA chest: No PE; b. 05/2018 MV: No ischemia/scar. EF 65%.   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 05/2018 CT Chest: Coronary and Ao atherosclerotic Calcifications.   Diabetes mellitus without complication (HCC)    Diastolic dysfunction    a. 05/2018 Echo: EF 60-65%, impaired relaxation. Nl RVSP.   Dysrhythmia    PVC's   Family history of adverse reaction to anesthesia    sister-malignant hyperthermia   FH of Malignant hyperthermia    a. sister had malginant hyperthermia   History of kidney stones    History of renal insufficiency    History of  skin cancer    Hyperlipemia    Osteoporosis    Pulmonary nodules    a. 05/2018 CT Chest: Small pulm nodules measuring up to 6mm avg diameter. Rec non-contrast Chest CT in 6-12 mos.   Thyroid  nodule     SURGICAL HISTORY: Past Surgical History:  Procedure Laterality Date   AXILLARY LYMPH NODE BIOPSY Right  01/12/2023   Procedure: RIGHT AXILLARY SENTINEL NODE BIOPSY;  Surgeon: Enid Harry, MD;  Location[facility];  Service: General;  Laterality: Right;  LMA   BREAST BIOPSY Right 10/27/2022   MM RT BREAST BX W LOC DEV 1ST LESION IMAGE BX SPEC STEREO GUIDE 10/27/2022 [facility]   BREAST BIOPSY  12/06/2022   MM RT RADIOACTIVE SEED LOC MAMMO GUIDE 12/06/2022 [facility]   BREAST LUMPECTOMY WITH RADIOACTIVE SEED LOCALIZATION Right 12/07/2022   Procedure: RIGHT BREAST LUMPECTOMY WITH RADIOACTIVE SEED LOCALIZATION;  Surgeon: Enid Harry, MD;  Location: [facility] OR;  Service: General;  Laterality: Right;   CESAREAN SECTION     CHOLECYSTECTOMY  2006   lap choli with umb hernia   COLONOSCOPY     HYSTEROSCOPY WITH D & C N/A 12/07/2022   Procedure: DILATATION AND CURETTAGE /HYSTEROSCOPY WITH POLYPECTOMY ENDOMETRIAL BIOPSY;  Surgeon: Reggy Capers, MD;  Location: [facility] OR;  Service: Gynecology;  Laterality: N/A;   LIPOMA EXCISION Left 03/15/2014   Procedure: EXCISION LIPOMA LEFT LOWER QUARDRANT ABDOMINAL WALL;  Surgeon: Dorena Gander, MD;  Location: [facility];  Service: General;  Laterality: Left;   PORTACATH PLACEMENT N/A 01/12/2023   Procedure: PORT PLACEMENT WITH ULTRASOUND GUIDANCE;  Surgeon: Enid Harry, MD;  Location: [facility] OR;  Service: General;  Laterality: N/A;   THYROIDECTOMY N/A 06/25/2019   Procedure: TOTAL THYROIDECTOMY;  Surgeon: Oralee Billow, MD;  Location: [facility];  Service: General;  Laterality: N/A;   TONSILLECTOMY     TRIGGER FINGER RELEASE  2012   lt small,middle,long   TRIGGER FINGER RELEASE Right 07/14/2017   Procedure: RELEASE TRIGGER FINGER/A-1 PULLEY RIGHT THUMB;  Surgeon: Lyanne Sample, MD;  Location: [facility];  Service: Orthopedics;  Laterality: Right;   UMBILICAL HERNIA REPAIR  2006   with lap choli    I have reviewed the social history and family history with the patient and they are unchanged from previous note.  ALLERGIES:  has no  known allergies.  MEDICATIONS:  Current Outpatient Medications  Medication Sig Dispense Refill   anastrozole  (ARIMIDEX ) 1 MG tablet Take 1 tablet (1 mg total) by mouth daily. 30 tablet 2   aspirin  EC 81 MG tablet Take 81 mg by mouth daily.     Insulin  Glargine (BASAGLAR  KWIKPEN) 100 UNIT/ML SOPN Inject 35 Units into the skin at bedtime.     insulin  lispro (HUMALOG) 100 UNIT/ML injection Inject 10-15 Units into the skin 3 (three) times daily before meals. Sliding scale     levothyroxine  (SYNTHROID ) 112 MCG tablet Take 112 mcg by mouth daily before breakfast.     lidocaine -prilocaine  (EMLA ) cream Apply to affected area once 30 g 3   metFORMIN  (GLUCOPHAGE ) 1000 MG tablet Take 1,000 mg by mouth 2 (two) times daily with a meal.     metoprolol  succinate (TOPROL -XL) 25 MG 24 hr tablet TAKE 1 TABLET (25 MG TOTAL) BY MOUTH DAILY. 90 tablet 0   nitrofurantoin (MACRODANTIN) 100 MG capsule Take 100 mg by mouth daily.     Nutritional Supplements (JUICE PLUS FIBRE PO) Take 3 tablets by mouth daily. Juice Plus Vegetable  Nutritional Supplements (JUICE PLUS FIBRE PO) Take 3 tablets by mouth daily. Juice Plus Fruit     ramipril  (ALTACE ) 5 MG capsule Take 5 mg by mouth daily with breakfast.     rosuvastatin  (CRESTOR ) 20 MG tablet Take 20 mg by mouth daily with supper.      triamcinolone  cream (KENALOG ) 0.1 % Apply 1 Application topically daily as needed (dermatitis).     No current facility-administered medications for this visit.    PHYSICAL EXAMINATION: ECOG PERFORMANCE STATUS: 0 - Asymptomatic  Vitals:   07/19/23 1325  BP: 132/72  Pulse: (!) 107  Resp: 18  Temp: 98.3 F (36.8 C)  SpO2: 97%   Wt Readings from Last 3 Encounters:  07/19/23 180 lb 9.6 oz (81.9 kg)  05/16/23 180 lb 8 oz (81.9 kg)  05/02/23 181 lb (82.1 kg)     GENERAL:alert, no distress and comfortable SKIN: skin color, texture, turgor are normal, no rashes or significant lesions EYES: normal, Conjunctiva are pink and  non-injected, sclera clear NECK: supple, thyroid  normal size, non-tender, without nodularity LYMPH:  no palpable lymphadenopathy in the cervical, axillary  LUNGS: clear to auscultation and percussion with normal breathing effort HEART: regular rate & rhythm and no murmurs and no lower extremity edema ABDOMEN:abdomen soft, non-tender and normal bowel sounds Musculoskeletal:no cyanosis of digits and no clubbing  NEURO: alert & oriented x 3 with fluent speech, no focal motor/sensory deficits  Physical Exam    LABORATORY DATA:  I have reviewed the data as listed    Latest Ref Rng & Units 07/19/2023   12:36 PM 06/15/2023   11:00 AM 06/01/2023   10:50 AM  CBC  WBC 4.0 - 10.5 K/uL 5.6  4.6  5.2   Hemoglobin 12.0 - 15.0 g/dL 16.1  09.6  04.5   Hematocrit 36.0 - 46.0 % 37.4  38.1  40.4   Platelets 150 - 400 K/uL 330  318  340         Latest Ref Rng & Units 07/19/2023   12:36 PM 04/21/2023   12:25 PM 04/13/2023    1:36 PM  CMP  Glucose 70 - 99 mg/dL 409  811  914   BUN 8 - 23 mg/dL 10  12  8    Creatinine 0.44 - 1.00 mg/dL 7.82  9.56  2.13   Sodium 135 - 145 mmol/L 137  136  136   Potassium 3.5 - 5.1 mmol/L 3.9  3.8  3.6   Chloride 98 - 111 mmol/L 101  100  101   CO2 22 - 32 mmol/L 28  28  28    Calcium  8.9 - 10.3 mg/dL 9.5  08.6  9.7   Total Protein 6.5 - 8.1 g/dL 6.8  7.4  6.8   Total Bilirubin 0.0 - 1.2 mg/dL 0.4  0.6  0.5   Alkaline Phos 38 - 126 U/L 71  65  61   AST 15 - 41 U/L 14  11  11    ALT 0 - 44 U/L 14  12  12        RADIOGRAPHIC STUDIES: I have personally reviewed the radiological images as listed and agreed with the findings in the report. No results found.    Orders Placed This Encounter  Procedures   MM 3D DIAGNOSTIC MAMMOGRAM BILATERAL BREAST    Standing Status:   Future    Expected Date:   10/16/2023    Expiration Date:   07/18/2024    Reason for Exam (SYMPTOM  OR DIAGNOSIS  REQUIRED):   screening    Preferred imaging location?:   [facility]   All  questions were answered. The patient knows to call the clinic with any problems, questions or concerns. No barriers to learning was detected. The total time spent in the appointment was 30 minutes.     Sonja [place], MD 07/19/2023

## 2023-07-20 ENCOUNTER — Encounter: Payer: Self-pay | Admitting: *Deleted

## 2023-07-20 ENCOUNTER — Inpatient Hospital Stay: Admitting: Occupational Therapy

## 2023-07-20 ENCOUNTER — Other Ambulatory Visit: Payer: Self-pay

## 2023-07-20 DIAGNOSIS — Z9189 Other specified personal risk factors, not elsewhere classified: Secondary | ICD-10-CM

## 2023-07-20 NOTE — Therapy (Signed)
 OUTPATIENT OCCUPATIONAL THERAPY SOZO SCREENING NOTE   Patient Name: [name] MRN: [id number] DOB:01/01/1956, 68 y.o., female Today's Date: 07/20/2023  PCP: Suzzanne Estrin, MD REFERRING PROVIDER: Suzzanne Estrin, MD   OT End of Session - 07/20/23 1319     Visit Number 0             Past Medical History:  Diagnosis Date   Anemia    Arthritis    Atypical chest pain    a. 05/2018 CTA chest: No PE; b. 05/2018 MV: No ischemia/scar. EF 65%.   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 05/2018 CT Chest: Coronary and Ao atherosclerotic Calcifications.   Diabetes mellitus without complication (HCC)    Diastolic dysfunction    a. 05/2018 Echo: EF 60-65%, impaired relaxation. Nl RVSP.   Dysrhythmia    PVC's   Family history of adverse reaction to anesthesia    sister-malignant hyperthermia   FH of Malignant hyperthermia    a. sister had malginant hyperthermia   History of kidney stones    History of renal insufficiency    History of skin cancer    Hyperlipemia    Osteoporosis    Pulmonary nodules    a. 05/2018 CT Chest: Small pulm nodules measuring up to 6mm avg diameter. Rec non-contrast Chest CT in 6-12 mos.   Thyroid  nodule    Past Surgical History:  Procedure Laterality Date   AXILLARY LYMPH NODE BIOPSY Right 01/12/2023   Procedure: RIGHT AXILLARY SENTINEL NODE BIOPSY;  Surgeon: Enid Harry, MD;  Location: [facility] OR;  Service: General;  Laterality: Right;  LMA   BREAST BIOPSY Right 10/27/2022   MM RT BREAST BX W LOC DEV 1ST LESION IMAGE BX SPEC STEREO GUIDE 10/27/2022 [facility]MO[facility]   BREAST BIOPSY  12/06/2022   MM RT RADIOACTIVE SEED LOC MAMMO GUIDE 12/06/2022 [facility]MO[facility]   BREAST LUMPECTOMY WITH RADIOACTIVE SEED LOCALIZATION Right 12/07/2022   Procedure: RIGHT BREAST LUMPECTOMY WITH RADIOACTIVE SEED LOCALIZATION;  Surgeon: Enid Harry, MD;  Location: [facility] OR;  Service: General;  Laterality: Right;   CESAREAN SECTION     CHOLECYSTECTOMY  2006   lap choli with umb hernia   COLONOSCOPY     HYSTEROSCOPY WITH D & C N/A 12/07/2022   Procedure: DILATATION AND CURETTAGE /HYSTEROSCOPY WITH POLYPECTOMY ENDOMETRIAL BIOPSY;  Surgeon: Reggy Capers, MD;  Location: [facility] OR;  Service: Gynecology;  Laterality: N/A;   LIPOMA EXCISION Left 03/15/2014   Procedure: EXCISION LIPOMA LEFT LOWER QUARDRANT ABDOMINAL WALL;  Surgeon: Dorena Gander, MD;  Location: [facility];  Service: General;  Laterality: Left;   PORTACATH PLACEMENT N/A 01/12/2023   Procedure: PORT PLACEMENT WITH ULTRASOUND GUIDANCE;  Surgeon: Enid Harry, MD;  Location: [facility] OR;  Service: General;  Laterality: N/A;   THYROIDECTOMY N/A 06/25/2019   Procedure: TOTAL THYROIDECTOMY;  Surgeon: Oralee Billow, MD;  Location[facility];  Service: General;  Laterality: N/A;   TONSILLECTOMY     TRIGGER FINGER RELEASE  2012   lt small,middle,long   TRIGGER FINGER RELEASE Right 07/14/2017   Procedure: RELEASE TRIGGER FINGER/A-1 PULLEY RIGHT THUMB;  Surgeon: Lyanne Sample, MD;  Location: [facility];  Service: Orthopedics;  Laterality: Right;   UMBILICAL HERNIA REPAIR  2006   with lap choli   Patient Active Problem List   Diagnosis Date Noted   Port-A-Cath  in place 02/23/2023   CHEK2 gene mutation positive 11/12/2022   Genetic testing 11/12/2022   Malignant neoplasm of upper-outer quadrant of right breast in female, estrogen receptor positive (HCC) 11/01/2022   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 This was compared to her baseline score. It was determined that she is within the recommended range when compared to her baseline and no further action is needed at this time. She will continue SOZO screenings. These are done every 3 months for 2 years post operatively followed by every 6 months for 2 years, and then annually.    Heloise Lobo, OTR/L,CLT 07/20/2023, 1:21 PM

## 2023-07-21 ENCOUNTER — Encounter: Payer: Self-pay | Admitting: Hematology

## 2023-07-28 ENCOUNTER — Other Ambulatory Visit: Payer: Self-pay

## 2023-08-01 ENCOUNTER — Encounter: Payer: Self-pay | Admitting: Radiation Oncology

## 2023-08-01 ENCOUNTER — Ambulatory Visit
Admission: RE | Admit: 2023-08-01 | Discharge: 2023-08-01 | Disposition: A | Discharge status: Home / Self Care | Source: Ambulatory Visit | Attending: Radiation Oncology | Admitting: Radiation Oncology

## 2023-08-01 VITALS — BP 123/71 | HR 71 | Temp 97.6°F | Resp 20 | Wt 179.0 lb

## 2023-08-01 DIAGNOSIS — Z170 Estrogen receptor positive status [ER+]: Secondary | ICD-10-CM | POA: Insufficient documentation

## 2023-08-01 DIAGNOSIS — Z923 Personal history of irradiation: Secondary | ICD-10-CM | POA: Diagnosis not present

## 2023-08-01 DIAGNOSIS — C50911 Malignant neoplasm of unspecified site of right female breast: Secondary | ICD-10-CM | POA: Insufficient documentation

## 2023-08-01 NOTE — Progress Notes (Signed)
 Radiation Oncology Follow up Note  Name: [name]   Date:   08/01/2023 MRN:  [id number] DOB: September 05, 1955    This 68 y.o. female presents to the clinic today for 1 month follow-up status post whole breast radiation to her right breast for stage Ia (pT1a N0 M0) ER positive PR negative HER2/neu overexpressed invasive mammary carcinoma status post wide local excision and adjuvant chemotherapy.  REFERRING PROVIDER: Tisovec, Kristina Pfeiffer, MD  HPI: Patient is a 68 year old female now out 1 month having completed whole breast radiation to her right breast for stage Ia ER positive HER2/neu overexpressed invasive mammary carcinoma.  Seen today in routine follow-up she is doing well.  She specifically denies breast tenderness cough or bone pain..  She is currently on Taxol  and Herceptin  which she is tolerating well.  COMPLICATIONS OF TREATMENT: none  FOLLOW UP COMPLIANCE: keeps appointments   PHYSICAL EXAM:  BP 123/71   Pulse 71   Temp 97.6 F (36.4 C) (Tympanic)   Resp 20   Wt 179 lb (81.2 kg)   BMI 28.89 kg/m  Lungs are clear to A&P cardiac examination essentially unremarkable with regular rate and rhythm. No dominant mass or nodularity is noted in either breast in 2 positions examined. Incision is well-healed. No axillary or supraclavicular adenopathy is appreciated. Cosmetic result is excellent.  Well-developed well-nourished patient in NAD. HEENT reveals PERLA, EOMI, discs not visualized.  Oral cavity is clear. No oral mucosal lesions are identified. Neck is clear without evidence of cervical or supraclavicular adenopathy. Lungs are clear to A&P. Cardiac examination is essentially unremarkable with regular rate and rhythm without murmur rub or thrill. Abdomen is benign with no organomegaly or masses noted. Motor sensory and DTR levels are equal and symmetric in the upper and lower extremities. Cranial nerves II through XII are grossly intact. Proprioception is intact. No peripheral adenopathy or  edema is identified. No motor or sensory levels are noted. Crude visual fields are within normal range.  RADIOLOGY RESULTS: No current films to review  PLAN: Present time patient is doing extremely well 1 month out from whole breast radiation and pleased with her overall progress.  She continues on Herceptin  and Taxol  under medical oncology's direction.  I have asked to see her back in 6 months for follow-up.  Patient knows to call with any concerns.  I would like to take this opportunity to thank you for allowing me to participate in the care of your patient.Glenis Langdon, MD

## 2023-08-02 ENCOUNTER — Other Ambulatory Visit: Payer: Self-pay

## 2023-08-09 ENCOUNTER — Other Ambulatory Visit

## 2023-08-09 ENCOUNTER — Ambulatory Visit: Admitting: Hematology

## 2023-08-09 ENCOUNTER — Inpatient Hospital Stay: Attending: Hematology

## 2023-08-09 VITALS — BP 129/92 | HR 99 | Temp 97.5°F | Resp 18

## 2023-08-09 DIAGNOSIS — Z170 Estrogen receptor positive status [ER+]: Secondary | ICD-10-CM | POA: Diagnosis not present

## 2023-08-09 DIAGNOSIS — Z1721 Progesterone receptor positive status: Secondary | ICD-10-CM | POA: Diagnosis not present

## 2023-08-09 DIAGNOSIS — Z5112 Encounter for antineoplastic immunotherapy: Secondary | ICD-10-CM | POA: Insufficient documentation

## 2023-08-09 DIAGNOSIS — Z1732 Human epidermal growth factor receptor 2 negative status: Secondary | ICD-10-CM | POA: Diagnosis not present

## 2023-08-09 DIAGNOSIS — C50411 Malignant neoplasm of upper-outer quadrant of right female breast: Secondary | ICD-10-CM | POA: Insufficient documentation

## 2023-08-09 MED ORDER — TRASTUZUMAB-HYALURONIDASE-OYSK 600-10000 MG-UNT/5ML ~~LOC~~ SOLN
600.0000 mg | Freq: Once | SUBCUTANEOUS | Status: AC
  Administered 2023-08-09: 600 mg via SUBCUTANEOUS
  Filled 2023-08-09: qty 5

## 2023-08-09 NOTE — Progress Notes (Signed)
 Patient reports she took 650mg  Tylenol  and 25mg  Benadryl  at home prior to arrival.

## 2023-08-09 NOTE — Patient Instructions (Signed)
 [facility] - A DEPT OF [facility]  Discharge Instructions: Thank you for choosing [facility] to provide your oncology and hematology care.   If you have a lab appointment with the Cancer Center, please go directly to the Cancer Center and check in at the registration area.   Wear comfortable clothing and clothing appropriate for easy access to any Portacath or PICC line.   We strive to give you quality time with your provider. You may need to reschedule your appointment if you arrive late (15 or more minutes).  Arriving late affects you and other patients whose appointments are after yours.  Also, if you miss three or more appointments without notifying the office, you may be dismissed from the clinic at the provider's discretion.      For prescription refill requests, have your pharmacy contact our office and allow 72 hours for refills to be completed.    Today you received the following chemotherapy and/or immunotherapy agents: Herceptin Hylecta.       To help prevent nausea and vomiting after your treatment, we encourage you to take your nausea medication as directed.  BELOW ARE SYMPTOMS THAT SHOULD BE REPORTED IMMEDIATELY: *FEVER GREATER THAN 100.4 F (38 C) OR HIGHER *CHILLS OR SWEATING *NAUSEA AND VOMITING THAT IS NOT CONTROLLED WITH YOUR NAUSEA MEDICATION *UNUSUAL SHORTNESS OF BREATH *UNUSUAL BRUISING OR BLEEDING *URINARY PROBLEMS (pain or burning when urinating, or frequent urination) *BOWEL PROBLEMS (unusual diarrhea, constipation, pain near the anus) TENDERNESS IN MOUTH AND THROAT WITH OR WITHOUT PRESENCE OF ULCERS (sore throat, sores in mouth, or a toothache) UNUSUAL RASH, SWELLING OR PAIN  UNUSUAL VAGINAL DISCHARGE OR ITCHING   Items with * indicate a potential emergency and should be followed up as soon as possible or go to the Emergency Department if any problems should occur.  Please show the CHEMOTHERAPY ALERT CARD or  IMMUNOTHERAPY ALERT CARD at check-in to the Emergency Department and triage nurse.  Should you have questions after your visit or need to cancel or reschedule your appointment, please contact [facility] - A DEPT OF [facility]  Dept: [phone]  and follow the prompts.  Office hours are 8:00 a.m. to 4:30 p.m. Monday - Friday. Please note that voicemails left after 4:00 p.m. may not be returned until the following business day.  We are closed weekends and major holidays. You have access to a nurse at all times for urgent questions. Please call the main number to the clinic Dept: [phone] and follow the prompts.   For any non-urgent questions, you may also contact your provider using MyChart. We now offer e-Visits for anyone 31 and older to request care online for non-urgent symptoms. For details visit [email or web address].   Also download the MyChart app! Go to the app store, search "MyChart", open the app, select [place], and log in with your MyChart username and password.

## 2023-08-17 ENCOUNTER — Encounter: Payer: Self-pay | Admitting: Internal Medicine

## 2023-08-23 ENCOUNTER — Other Ambulatory Visit: Payer: Self-pay | Admitting: Cardiovascular Disease

## 2023-08-23 DIAGNOSIS — I493 Ventricular premature depolarization: Secondary | ICD-10-CM

## 2023-08-29 ENCOUNTER — Encounter: Payer: Self-pay | Admitting: *Deleted

## 2023-08-30 ENCOUNTER — Ambulatory Visit: Admitting: Hematology

## 2023-08-30 ENCOUNTER — Other Ambulatory Visit

## 2023-08-30 ENCOUNTER — Other Ambulatory Visit: Payer: Self-pay

## 2023-08-30 ENCOUNTER — Inpatient Hospital Stay: Attending: Hematology

## 2023-08-30 VITALS — BP 140/79 | HR 115 | Temp 97.7°F | Resp 20 | Wt 178.8 lb

## 2023-08-30 DIAGNOSIS — Z5112 Encounter for antineoplastic immunotherapy: Secondary | ICD-10-CM | POA: Insufficient documentation

## 2023-08-30 DIAGNOSIS — R42 Dizziness and giddiness: Secondary | ICD-10-CM | POA: Diagnosis not present

## 2023-08-30 DIAGNOSIS — C50411 Malignant neoplasm of upper-outer quadrant of right female breast: Secondary | ICD-10-CM | POA: Diagnosis present

## 2023-08-30 DIAGNOSIS — Z1721 Progesterone receptor positive status: Secondary | ICD-10-CM | POA: Diagnosis not present

## 2023-08-30 DIAGNOSIS — Z170 Estrogen receptor positive status [ER+]: Secondary | ICD-10-CM

## 2023-08-30 DIAGNOSIS — Z79899 Other long term (current) drug therapy: Secondary | ICD-10-CM | POA: Insufficient documentation

## 2023-08-30 DIAGNOSIS — Z1732 Human epidermal growth factor receptor 2 negative status: Secondary | ICD-10-CM | POA: Insufficient documentation

## 2023-08-30 LAB — URINALYSIS, COMPLETE (UACMP) WITH MICROSCOPIC
Bilirubin Urine: NEGATIVE
Glucose, UA: >=500 mg/dL — AB
Ketones, ur: NEGATIVE mg/dL
Nitrite: NEGATIVE
Protein, ur: NEGATIVE mg/dL
Specific Gravity, Urine: 1.003 — ABNORMAL LOW (ref 1.005–1.030)
pH: 5 (ref 5.0–8.0)

## 2023-08-30 MED ORDER — TRASTUZUMAB-HYALURONIDASE-OYSK 600-10000 MG-UNT/5ML ~~LOC~~ SOLN
600.0000 mg | Freq: Once | SUBCUTANEOUS | Status: AC
  Administered 2023-08-30: 600 mg via SUBCUTANEOUS
  Filled 2023-08-30: qty 5

## 2023-08-30 MED ORDER — ACETAMINOPHEN 325 MG PO TABS: 650.0000 mg | ORAL_TABLET | Freq: Once | ORAL | Status: DC

## 2023-08-30 MED ORDER — DIPHENHYDRAMINE HCL 25 MG PO CAPS: 25.0000 mg | ORAL_CAPSULE | Freq: Once | ORAL | Status: DC

## 2023-08-30 MED ORDER — SODIUM CHLORIDE 0.9 % IV SOLN: Freq: Once | INTRAVENOUS | Status: DC

## 2023-08-30 MED ORDER — HEPARIN SOD (PORK) LOCK FLUSH 100 UNIT/ML IV SOLN
500.0000 [IU] | Freq: Once | INTRAVENOUS | Status: DC | PRN
Start: 2023-08-30 — End: 2023-08-30

## 2023-08-30 MED ORDER — SODIUM CHLORIDE 0.9% FLUSH: 10.0000 mL | INTRAVENOUS | Status: DC | PRN

## 2023-08-30 NOTE — Patient Instructions (Signed)
 [facility] - A DEPT OF [facility]  Discharge Instructions: Thank you for choosing [facility] to provide your oncology and hematology care.   If you have a lab appointment with the Cancer Center, please go directly to the Cancer Center and check in at the registration area.   Wear comfortable clothing and clothing appropriate for easy access to any Portacath or PICC line.   We strive to give you quality time with your provider. You may need to reschedule your appointment if you arrive late (15 or more minutes).  Arriving late affects you and other patients whose appointments are after yours.  Also, if you miss three or more appointments without notifying the office, you may be dismissed from the clinic at the provider's discretion.      For prescription refill requests, have your pharmacy contact our office and allow 72 hours for refills to be completed.    Today you received the following chemotherapy and/or immunotherapy agents: Herceptin Hylecta.       To help prevent nausea and vomiting after your treatment, we encourage you to take your nausea medication as directed.  BELOW ARE SYMPTOMS THAT SHOULD BE REPORTED IMMEDIATELY: *FEVER GREATER THAN 100.4 F (38 C) OR HIGHER *CHILLS OR SWEATING *NAUSEA AND VOMITING THAT IS NOT CONTROLLED WITH YOUR NAUSEA MEDICATION *UNUSUAL SHORTNESS OF BREATH *UNUSUAL BRUISING OR BLEEDING *URINARY PROBLEMS (pain or burning when urinating, or frequent urination) *BOWEL PROBLEMS (unusual diarrhea, constipation, pain near the anus) TENDERNESS IN MOUTH AND THROAT WITH OR WITHOUT PRESENCE OF ULCERS (sore throat, sores in mouth, or a toothache) UNUSUAL RASH, SWELLING OR PAIN  UNUSUAL VAGINAL DISCHARGE OR ITCHING   Items with * indicate a potential emergency and should be followed up as soon as possible or go to the Emergency Department if any problems should occur.  Please show the CHEMOTHERAPY ALERT CARD or  IMMUNOTHERAPY ALERT CARD at check-in to the Emergency Department and triage nurse.  Should you have questions after your visit or need to cancel or reschedule your appointment, please contact [facility] - A DEPT OF [facility]  Dept: [phone]  and follow the prompts.  Office hours are 8:00 a.m. to 4:30 p.m. Monday - Friday. Please note that voicemails left after 4:00 p.m. may not be returned until the following business day.  We are closed weekends and major holidays. You have access to a nurse at all times for urgent questions. Please call the main number to the clinic Dept: [phone] and follow the prompts.   For any non-urgent questions, you may also contact your provider using MyChart. We now offer e-Visits for anyone 31 and older to request care online for non-urgent symptoms. For details visit [email or web address].   Also download the MyChart app! Go to the app store, search "MyChart", open the app, select [place], and log in with your MyChart username and password.

## 2023-08-30 NOTE — Progress Notes (Signed)
 Per Maryalice Smaller MD, ok to treat with UTI symptoms and elevated HR 115. Urine sample sent to lab for testing.

## 2023-08-31 ENCOUNTER — Other Ambulatory Visit: Payer: Self-pay

## 2023-08-31 MED ORDER — CIPROFLOXACIN HCL 500 MG PO TABS: 500.0000 mg | ORAL_TABLET | Freq: Two times a day (BID) | ORAL | 0 refills | Status: DC

## 2023-09-01 LAB — URINE CULTURE: Culture: 20000 — AB

## 2023-09-16 ENCOUNTER — Encounter: Payer: Self-pay | Admitting: Hematology

## 2023-09-20 ENCOUNTER — Inpatient Hospital Stay

## 2023-09-20 ENCOUNTER — Inpatient Hospital Stay: Admitting: Hematology

## 2023-09-20 ENCOUNTER — Encounter: Payer: Self-pay | Admitting: Hematology

## 2023-09-20 ENCOUNTER — Encounter: Payer: Self-pay | Admitting: Cardiology

## 2023-09-20 ENCOUNTER — Ambulatory Visit: Attending: Cardiology | Admitting: Cardiology

## 2023-09-20 VITALS — BP 124/80 | HR 106 | Ht 66.0 in | Wt 170.0 lb

## 2023-09-20 VITALS — BP 138/82 | HR 104 | Temp 97.6°F | Resp 17 | Wt 173.0 lb

## 2023-09-20 DIAGNOSIS — C50411 Malignant neoplasm of upper-outer quadrant of right female breast: Secondary | ICD-10-CM

## 2023-09-20 DIAGNOSIS — E118 Type 2 diabetes mellitus with unspecified complications: Secondary | ICD-10-CM

## 2023-09-20 DIAGNOSIS — E782 Mixed hyperlipidemia: Secondary | ICD-10-CM

## 2023-09-20 DIAGNOSIS — I25118 Atherosclerotic heart disease of native coronary artery with other forms of angina pectoris: Secondary | ICD-10-CM | POA: Diagnosis not present

## 2023-09-20 DIAGNOSIS — Z170 Estrogen receptor positive status [ER+]: Secondary | ICD-10-CM | POA: Diagnosis not present

## 2023-09-20 DIAGNOSIS — I10 Essential (primary) hypertension: Secondary | ICD-10-CM | POA: Diagnosis not present

## 2023-09-20 DIAGNOSIS — D0511 Intraductal carcinoma in situ of right breast: Secondary | ICD-10-CM

## 2023-09-20 DIAGNOSIS — R000 Tachycardia, unspecified: Secondary | ICD-10-CM

## 2023-09-20 DIAGNOSIS — Z5112 Encounter for antineoplastic immunotherapy: Secondary | ICD-10-CM | POA: Diagnosis not present

## 2023-09-20 LAB — CBC WITH DIFFERENTIAL (CANCER CENTER ONLY)
Abs Immature Granulocytes: 0.34 10*3/uL — ABNORMAL HIGH (ref 0.00–0.07)
Basophils Absolute: 0.1 10*3/uL (ref 0.0–0.1)
Basophils Relative: 1 %
Eosinophils Absolute: 1.2 10*3/uL — ABNORMAL HIGH (ref 0.0–0.5)
Eosinophils Relative: 9 %
HCT: 33.1 % — ABNORMAL LOW (ref 36.0–46.0)
Hemoglobin: 11.2 g/dL — ABNORMAL LOW (ref 12.0–15.0)
Immature Granulocytes: 3 %
Lymphocytes Relative: 6 %
Lymphs Abs: 0.8 10*3/uL (ref 0.7–4.0)
MCH: 26.1 pg (ref 26.0–34.0)
MCHC: 33.8 g/dL (ref 30.0–36.0)
MCV: 77.2 fL — ABNORMAL LOW (ref 80.0–100.0)
Monocytes Absolute: 1.1 10*3/uL — ABNORMAL HIGH (ref 0.1–1.0)
Monocytes Relative: 8 %
Neutro Abs: 9.9 10*3/uL — ABNORMAL HIGH (ref 1.7–7.7)
Neutrophils Relative %: 73 %
Platelet Count: 670 10*3/uL — ABNORMAL HIGH (ref 150–400)
RBC: 4.29 MIL/uL (ref 3.87–5.11)
RDW: 14.7 % (ref 11.5–15.5)
WBC Count: 13.5 10*3/uL — ABNORMAL HIGH (ref 4.0–10.5)
nRBC: 0 % (ref 0.0–0.2)

## 2023-09-20 LAB — CMP (CANCER CENTER ONLY)
ALT: 37 U/L (ref 0–44)
AST: 34 U/L (ref 15–41)
Albumin: 3 g/dL — ABNORMAL LOW (ref 3.5–5.0)
Alkaline Phosphatase: 139 U/L — ABNORMAL HIGH (ref 38–126)
Anion gap: 9 (ref 5–15)
BUN: 7 mg/dL — ABNORMAL LOW (ref 8–23)
CO2: 28 mmol/L (ref 22–32)
Calcium: 9.1 mg/dL (ref 8.9–10.3)
Chloride: 91 mmol/L — ABNORMAL LOW (ref 98–111)
Creatinine: 0.67 mg/dL (ref 0.44–1.00)
GFR, Estimated: >60 mL/min (ref >60)
Glucose, Bld: 204 mg/dL — ABNORMAL HIGH (ref 70–99)
Potassium: 3.9 mmol/L (ref 3.5–5.1)
Sodium: 128 mmol/L — ABNORMAL LOW (ref 135–145)
Total Bilirubin: 0.3 mg/dL (ref 0.0–1.2)
Total Protein: 6.9 g/dL (ref 6.5–8.1)

## 2023-09-20 MED ORDER — METOPROLOL SUCCINATE ER 25 MG PO TB24
37.5000 mg | ORAL_TABLET | Freq: Every day | ORAL | 3 refills | Status: DC
Start: 2023-09-20 — End: 2023-10-31

## 2023-09-20 NOTE — Progress Notes (Signed)
 [facility]   [phone] [phone]   Clinic Follow up Note   Patient Care Team: Tisovec, [name], MD as PCP - General (Internal Medicine) [name], MD as PCP - Cardiology (Cardiology) [name], MD as Consulting Physician (Hematology) [name], MD as Consulting Physician (General Surgery) [name], MD as Consulting Physician (Radiation Oncology) [name], RN (Inactive) as Oncology Nurse Navigator [name], RN as Oncology Nurse Navigator [name], MD as Consulting Physician (Radiation Oncology)  Date of Service:  09/20/2023  CHIEF COMPLAINT: f/u of breast cancer  CURRENT THERAPY:  Adjuvant trastuzumab  and anastrozole   Oncology History   Malignant neoplasm of upper-outer quadrant of right breast in female, estrogen receptor positive (HCC) pT1aN0M0, stage IA, G2, HER2 positive, ER positive, PR negative, HER2+, and DCIS(+) It was discovered on screening mammogram, initial biopsy showed DCIS only.   -Status post a right lumpectomy.  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 Margins are negative for invasive cancer, but DCIS margin was close. No lymphovascular or perineural invasion. Lymph node biopsy was not performed, Dr. Ebbie has ordered an ultrasound of the right axilla for further evaluation.  If ultrasound is negative, I think it is okay not to have sentinel lymph node biopsy given the small primary tumor. -Given the aggressive nature of her HER2 positive disease, and moderate risk of recurrence, I do recommend adjuvant chemotherapy with Taxol  or Abraxane  once weekly for 12 weeks. -she underwent port placement and right axillary SLN biopsy on 01/12/23 and all 3 nodes were negative   -She started to weekly paclitaxel  and trastuzumab  on February 02, 2023, completed Taxol  on 1/213/2025, plan for adjuvant  trastuzumab  for 1 year -she started adjuvant anastrozole  in late April 2025  Assessment & Plan Breast cancer Completed chemotherapy in January 2025. Currently receiving antibody treatment. Symptoms of nausea, vomiting, and weight loss may be related to previous infection and antibiotics. Treatment held due to symptoms and potential cardiac concerns. Echocardiogram scheduled to assess cardiac function before resuming treatment. - Hold antibody treatment until October 11, 2023 - Schedule echocardiogram on October 05, 2023 - Resume treatment on October 11, 2023 if echocardiogram results are satisfactory  Nausea and vomiting Persistent nausea and vomiting over the past two weeks, possibly related to recent antibiotic use for UTI. Symptoms have improved slightly. No diarrhea reported. She tolerates fluids but has decreased oral intake of solids. - Ensure adequate hydration  Anemia Mild anemia, possibly related to recent infection and antibiotic use.  Leukocytosis Elevated white blood cell count, likely residual from recent infection.  Thrombocytosis Elevated platelet count, likely residual from recent infection.  Hyponatremia Low sodium levels, likely due to decreased oral intake. Advised to increase dietary salt intake. - Increase dietary salt intake  Tachycardia Elevated heart rate, possibly related to recent illness and decreased oral intake. Cardiologist increased metoprolol  dosage to 37.5 mg. - Continue increased metoprolol  dosage - Monitor heart rate  Hematuria Blood in urine noted during recent UTI evaluation. No associated dysuria. Urine occasionally appears darker. - Monitor for changes in urine color or symptoms  Plan - Due to her recent illness and potential cardiac concerns, I will hold trastuzumab  today - She is scheduled for echocardiogram in 2 weeks - Follow-up in 3 weeks as scheduled, if echo shows good EF, will resume trastuzumab  next visit.     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  No other deleterious variants in [facility] Common Hereditary Cancers +RNA Panel. Report date is 11/09/2022.    The [employer] Common Hereditary Cancers + RNA Panel includes sequencing, deletion/duplication, and RNA analysis of the following 48 genes: APC, ATM, AXIN2, BAP1, BARD1, BMPR1A, BRCA1, BRCA2, BRIP1, CDH1, CDK4*, CDKN2A*, CHEK2, CTNNA1, DICER1, EPCAM* (del/dup only), FH, GREM1* (promoter dup analysis only), HOXB13*, KIT*, MBD4*, MEN1, MLH1, MSH2, MSH3, MSH6, MUTYH, NF1, NTHL1, PALB2, PDGFRA*, PMS2, POLD1, POLE, PTEN, RAD51C, RAD51D, SDHA (sequencing only), SDHB, SDHC, SDHD, SMAD4, SMARCA4, STK11, TP53, TSC1, TSC2, VHL.  *Genes without RNA analysis.    12/07/2022 Cancer Staging   Staging form: Breast, AJCC 8th Edition - Pathologic stage from 12/07/2022: Stage IA (pT1a, pN0, cM0, G2, ER+, PR-, HER2+) - Signed by [name], MD on 12/20/2022 Stage prefix: Initial diagnosis Histologic grading system: 3 grade system Residual tumor (R): R0 - None   02/02/2023 -  Chemotherapy   Patient is on Treatment Plan : BREAST Abraxane  + Trastuzumab  q7d / Trastuzumab  q21d        Discussed the use of AI scribe software for clinical note transcription with the patient, who gave verbal consent to proceed.  History of Present Illness [name] is a 68 year old female with breast cancer who presents for follow-up.  She has  experienced nausea and lack of appetite for two weeks, with recent improvement. There is an aversion to food smells and difficulty keeping food down, leading to vomiting twice. She can now eat small amounts like soups and mandarin oranges. She has lost ten pounds since her last cardiology visit.  Three weeks ago, she suspected a urinary tract infection and was treated with antibiotics. Further testing showed blood in urine but no bacteria, leading to another antibiotic course. No burning, frequency, or urgency is present. She had a low-grade fever with temperatures up to 100.65F, now resolved. Recent labs show high white count, anemia, elevated platelet count, and low sodium levels. Fluid intake is maintained.  Her metoprolol  dosage was increased due to elevated heart rates, recorded as high as 122 bpm at rest. No palpitations or shortness of breath during activity. She has a 'hacky cough' on deep breaths but denies leg swelling or significant shortness of breath. She is due for her next treatment on July 15th and has a follow-up on August 26th.     All other systems were reviewed with the patient and are negative.  MEDICAL HISTORY:  Past Medical History:  Diagnosis Date   Anemia    Arthritis    Atypical chest pain    a. 05/2018 CTA chest: No PE; b. 05/2018 MV: No ischemia/scar. EF 65%.   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 05/2018 CT Chest: Coronary and Ao atherosclerotic Calcifications.   Diabetes mellitus without complication (HCC)    Diastolic dysfunction  a. 05/2018 Echo: EF 60-65%, impaired relaxation. Nl RVSP.   Dysrhythmia    PVC's   Family history of adverse reaction to anesthesia    sister-malignant hyperthermia   FH of Malignant hyperthermia    a. sister had malginant hyperthermia   History of kidney stones    History of renal insufficiency    History of skin  cancer    Hyperlipemia    Osteoporosis    Pulmonary nodules    a. 05/2018 CT Chest: Small pulm nodules measuring up to 6mm avg diameter. Rec non-contrast Chest CT in 6-12 mos.   Thyroid  nodule     SURGICAL HISTORY: Past Surgical History:  Procedure Laterality Date   AXILLARY LYMPH NODE BIOPSY Right 01/12/2023   Procedure: RIGHT AXILLARY SENTINEL NODE BIOPSY;  Surgeon: [name], MD;  Location: [facility] OR;  Service: General;  Laterality: Right;  LMA   BREAST BIOPSY Right 10/27/2022   MM RT BREAST BX W LOC DEV 1ST LESION IMAGE BX SPEC STEREO GUIDE 10/27/2022 [facility]   BREAST BIOPSY  12/06/2022   MM RT RADIOACTIVE SEED LOC MAMMO GUIDE 12/06/2022 [facility]   BREAST LUMPECTOMY WITH RADIOACTIVE SEED LOCALIZATION Right 12/07/2022   Procedure: RIGHT BREAST LUMPECTOMY WITH RADIOACTIVE SEED LOCALIZATION;  Surgeon: [name], MD;  Location: [facility] OR;  Service: General;  Laterality: Right;   CESAREAN SECTION     CHOLECYSTECTOMY  2006   lap choli with umb hernia   COLONOSCOPY     HYSTEROSCOPY WITH D & C N/A 12/07/2022   Procedure: DILATATION AND CURETTAGE /HYSTEROSCOPY WITH POLYPECTOMY ENDOMETRIAL BIOPSY;  Surgeon: Okey Leader, MD;  Location: [facility] OR;  Service: Gynecology;  Laterality: N/A;   LIPOMA EXCISION Left 03/15/2014   Procedure: EXCISION LIPOMA LEFT LOWER QUARDRANT ABDOMINAL WALL;  Surgeon: Dann Hummer, MD;  Location: [facility];  Service: General;  Laterality: Left;   PORTACATH PLACEMENT N/A 01/12/2023   Procedure: PORT PLACEMENT WITH ULTRASOUND GUIDANCE;  Surgeon: [name], MD;  Location: [facility] OR;  Service: General;  Laterality: N/A;   THYROIDECTOMY N/A 06/25/2019   Procedure: TOTAL THYROIDECTOMY;  Surgeon: Eletha Boas, MD;  Location: [facility];  Service: General;  Laterality: N/A;   TONSILLECTOMY     TRIGGER FINGER RELEASE  2012   lt small,middle,long   TRIGGER FINGER RELEASE Right 07/14/2017   Procedure: RELEASE TRIGGER FINGER/A-1 PULLEY  RIGHT THUMB;  Surgeon: Murrell Kuba, MD;  Location: [facility];  Service: Orthopedics;  Laterality: Right;   UMBILICAL HERNIA REPAIR  2006   with lap choli    I have reviewed the social history and family history with the patient and they are unchanged from previous note.  ALLERGIES:  has no known allergies.  MEDICATIONS:  Current Outpatient Medications  Medication Sig Dispense Refill   anastrozole  (ARIMIDEX ) 1 MG tablet Take 1 tablet (1 mg total) by mouth daily. 30 tablet 2   aspirin  EC 81 MG tablet Take 81 mg by mouth daily.     Insulin  Glargine (BASAGLAR  KWIKPEN) 100 UNIT/ML SOPN Inject 35 Units into the skin at bedtime.     insulin  lispro (HUMALOG) 100 UNIT/ML injection Inject 10-15 Units into the skin 3 (three) times daily before meals. Sliding scale     levothyroxine  (SYNTHROID ) 112 MCG tablet Take 112 mcg by mouth daily before breakfast.     metFORMIN  (GLUCOPHAGE ) 1000 MG tablet Take 1,000 mg by mouth 2 (two) times daily with a meal.     metoprolol  succinate (TOPROL  XL) 25 MG 24  hr tablet Take 1.5 tablets (37.5 mg total) by mouth daily. 135 tablet 3   nitrofurantoin (MACRODANTIN) 100 MG capsule Take 100 mg by mouth daily.     Nutritional Supplements (JUICE PLUS FIBRE PO) Take 3 tablets by mouth daily. Juice Plus Vegetable     Nutritional Supplements (JUICE PLUS FIBRE PO) Take 3 tablets by mouth daily. Juice Plus Fruit     ramipril  (ALTACE ) 5 MG capsule Take 5 mg by mouth daily with breakfast.     rosuvastatin  (CRESTOR ) 20 MG tablet Take 20 mg by mouth daily with supper.      triamcinolone  cream (KENALOG ) 0.1 % Apply 1 Application topically daily as needed (dermatitis).     No current facility-administered medications for this visit.    PHYSICAL EXAMINATION: ECOG PERFORMANCE STATUS: 1 - Symptomatic but completely ambulatory  Vitals:   09/20/23 1238  BP: 138/82  Pulse: (!) 104  Resp: 17  Temp: 97.6 F (36.4 C)  SpO2: 98%   Wt Readings from Last 3  Encounters:  09/20/23 173 lb (78.5 kg)  09/20/23 170 lb (77.1 kg)  08/30/23 178 lb 12.8 oz (81.1 kg)     GENERAL:alert, no distress and comfortable SKIN: skin color, texture, turgor are normal, no rashes or significant lesions EYES: normal, Conjunctiva are pink and non-injected, sclera clear NECK: supple, thyroid  normal size, non-tender, without nodularity LYMPH:  no palpable lymphadenopathy in the cervical, axillary  LUNGS: clear to auscultation and percussion with normal breathing effort with scatter rales on both lung base  HEART: regular rate & rhythm and no murmurs and no lower extremity edema ABDOMEN:abdomen soft, non-tender and normal bowel sounds Musculoskeletal:no cyanosis of digits and no clubbing  NEURO: alert & oriented x 3 with fluent speech, no focal motor/sensory deficits  Physical Exam   LABORATORY DATA:  I have reviewed the data as listed    Latest Ref Rng & Units 09/20/2023   12:22 PM 07/19/2023   12:36 PM 06/15/2023   11:00 AM  CBC  WBC 4.0 - 10.5 K/uL 13.5  5.6  4.6   Hemoglobin 12.0 - 15.0 g/dL 88.7  87.5  87.2   Hematocrit 36.0 - 46.0 % 33.1  37.4  38.1   Platelets 150 - 400 K/uL 670  330  318         Latest Ref Rng & Units 09/20/2023   12:22 PM 07/19/2023   12:36 PM 04/21/2023   12:25 PM  CMP  Glucose 70 - 99 mg/dL 795  810  869   BUN 8 - 23 mg/dL 7  10  12    Creatinine 0.44 - 1.00 mg/dL 9.32  9.42  9.43   Sodium 135 - 145 mmol/L 128  137  136   Potassium 3.5 - 5.1 mmol/L 3.9  3.9  3.8   Chloride 98 - 111 mmol/L 91  101  100   CO2 22 - 32 mmol/L 28  28  28    Calcium  8.9 - 10.3 mg/dL 9.1  9.5  89.9   Total Protein 6.5 - 8.1 g/dL 6.9  6.8  7.4   Total Bilirubin 0.0 - 1.2 mg/dL 0.3  0.4  0.6   Alkaline Phos 38 - 126 U/L 139  71  65   AST 15 - 41 U/L 34  14  11   ALT 0 - 44 U/L 37  14  12       RADIOGRAPHIC STUDIES: I have personally reviewed the radiological images as listed and agreed with the findings  in the report. No results found.     No orders of the defined types were placed in this encounter.  All questions were answered. The patient knows to call the clinic with any problems, questions or concerns. No barriers to learning was detected. The total time spent in the appointment was 25 minutes, including review of chart and various tests results, discussions about plan of care and coordination of care plan     Onita Mattock, MD 09/20/2023

## 2023-09-20 NOTE — Progress Notes (Signed)
 Cardiology Office Note   Date:  09/20/2023  ID:  Patricia, Docter [name] 04, 1957, MRN [id number] PCP: Tisovec, Richard W, MD  [facility] Providers Cardiologist:  Deatrice Cage, MD     History of Present Illness [name] is a 68 y.o. female with a past medical history of coronary atherosclerosis, type 2 diabetes, hypertension, hypothyroidism, hyperlipidemia, obesity, breast cancer, who presents today for follow-up.   She was hospitalized in March 2020 with atypical chest pain.  Echocardiogram showed normal LV systolic function and no regional wall motion abnormalities.  CT of the chest was negative for pulmonary embolism but did show coronary and aortic calcifications as well as small pulmonary nodules.  She underwent stress testing which was negative for ischemia. Pulmonary nodules have been stable on repeat CT imaging.  She was found to have thyroid  nodules and underwent thyroidectomy on March 2021.  Pathology was benign and she had been continued on thyroid  replacement therapy.  She had screening mammogram completed in [name] 2024 which showed a 1.7 cm group of indeterminate calcification in the upper outer right breast.  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  And PR was negative.  She underwent lumpectomy without nodal removal.  She subsequently had 10 nodes removed in October when she had a Chemo-Port placed.  She went underwent chemotherapy for breast cancer and completed 6 treatments with 6 treatments for ago.  It was recommended she would be off of treatments for proxy 1 month and undergo a month of radiation.   She was last seen in clinic 03/14/2023, doing fairly well from a cardiac perspective.  She denies chest pain, shortness of breath or palpitations.  She did a recent echocardiogram which revealed an LVEF of 50-55%, no RWMA, G1 DD, mild MR.  She was advised that she would need to continue to have repeat echocardiograms while she was undergoing treatment.  She  returns clinic today stating that she has finished chemotherapy, radiation, and is now on immunotherapy.  She states that she has recently been treated for UTI was on Cipro  and then was placed on Macrobid.  She states she and her daughter had gone to the beach and she has felt horrible just not herself has not been able to eat and has lost approximately 10 pounds.  States that she has a follow-up appointment with the cancer center today prior to her shot and will have the labs completed at that time.  Denies any chest pain, shortness of breath or peripheral edema.  States that she has been compliant with her current medication regimen.  Denies any hospitalizations or visits to the emergency department.  She notes that after she was initially treated for UTI symptoms did not get better but she went back to urgent care and that is where the second antibiotic was prescribed.  ROS: 10 point review of systems has been reviewed and considered negative except ones are listed in the HPI  Studies Reviewed EKG Interpretation Date/Time:  Tuesday September 20 2023 08:43:47 EDT Ventricular Rate:  106 PR Interval:  126 QRS Duration:  86 QT Interval:  350 QTC Calculation: 464 R Axis:   -11  Text Interpretation: Sinus tachycardia with occasional Premature ventricular complexes Nonspecific ST abnormality When compared with ECG of 14-Mar-2023 10:06, No significant change was found Confirmed by Gerard Frederick ([id number]) on 09/20/2023 8:48:15 AM    Limited 2D echo 07/07/2023 1. Left ventricular ejection fraction, by estimation, is 45 to 50%. The  left ventricle has mildly  decreased function. The left ventricle has no  regional wall motion abnormalities. Left ventricular diastolic parameters  are consistent with Grade I  diastolic dysfunction (impaired relaxation).   2. Right ventricular systolic function is normal. The right ventricular  size is normal. Tricuspid regurgitation signal is inadequate for assessing  PA  pressure.   3. The mitral valve is normal in structure. No evidence of mitral valve  regurgitation. No evidence of mitral stenosis.   4. The aortic valve is normal in structure. Aortic valve regurgitation is  not visualized. No aortic stenosis is present.   5. The inferior vena cava is normal in size with greater than 50%  respiratory variability, suggesting right atrial pressure of 3 mmHg.   2D echo 04/06/2023 1. Left ventricular ejection fraction, by estimation, is 45 to 50%. Left  ventricular ejection fraction by PLAX is 49 %. The left ventricle has  moderately decreased function. The left ventricle demonstrates global  hypokinesis. Left ventricular diastolic  parameters are consistent with Grade I diastolic dysfunction (impaired  relaxation).   2. Right ventricular systolic function is normal. The right ventricular  size is normal. Tricuspid regurgitation signal is inadequate for assessing  PA pressure.   3. The mitral valve is normal in structure. Mild mitral valve  regurgitation. No evidence of mitral stenosis.   4. The aortic valve is normal in structure. There is mild calcification  of the aortic valve. Aortic valve regurgitation is not visualized. Aortic  valve sclerosis is present, with no evidence of aortic valve stenosis.   5. There is mild dilatation of the ascending aorta, measuring 40 mm.   6. The inferior vena cava is normal in size with greater than 50%  respiratory variability, suggesting right atrial pressure of 3 mmHg.   2D echo 12/23/2022 1. Left ventricular ejection fraction, by estimation, is 50 to 55%. The  left ventricle has low normal function. The left ventricle has no regional  wall motion abnormalities. The left ventricular internal cavity size was  mildly dilated. Left ventricular  diastolic parameters are consistent with Grade I diastolic dysfunction  (impaired relaxation). The average left ventricular global longitudinal  strain is -13.9 %.   2. Right  ventricular systolic function is normal. The right ventricular  size is normal. Tricuspid regurgitation signal is inadequate for assessing  PA pressure.   3. The mitral valve is normal in structure. Mild mitral valve  regurgitation. No evidence of mitral stenosis.   4. The aortic valve is tricuspid. There is mild calcification of the  aortic valve. Aortic valve regurgitation is not visualized. Aortic valve  sclerosis is present, with no evidence of aortic valve stenosis.   5. There is mild dilatation of the ascending aorta, measuring 40 mm.   6. The inferior vena cava is normal in size with greater than 50%  respiratory variability, suggesting right atrial pressure of 3 mmHg.    Lexiscan  MPI 06/16/2018 Normal pharmacologic myocardial perfusion stress test without significant ischemia or scar. The left ventricular ejection fraction is normal (65%). Attenuation correction CT shows coronary artery and aortic atherosclerotic calcification. This is a low risk study.   2D echo 06/15/2018 1. The left ventricle has normal systolic function with an ejection  fraction of 60-65%. The cavity size was normal. Left ventricular diastolic  Doppler parameters are consistent with impaired relaxation.   2. The right ventricle has normal systolic function. The cavity was  normal. There is no increase in right ventricular wall thickness. Normal  RVSP  Risk  Assessment/Calculations           Physical Exam VS:  BP 124/80   Pulse (!) 106   Ht 5' 6 (1.676 m)   Wt 170 lb (77.1 kg)   SpO2 94%   BMI 27.44 kg/m        Wt Readings from Last 3 Encounters:  09/20/23 170 lb (77.1 kg)  08/30/23 178 lb 12.8 oz (81.1 kg)  08/01/23 179 lb (81.2 kg)    GEN: Well nourished, well developed in no acute distress NECK: No JVD; No carotid bruits CARDIAC: RRR, tachycardic, no murmurs, rubs, gallops RESPIRATORY:  Clear to auscultation without rales, wheezing or rhonchi  ABDOMEN: Soft, non-tender,  non-distended EXTREMITIES:  No edema; No deformity   ASSESSMENT AND PLAN Coronary sclerosis noted on CT. EKG today reveals sinus tach with a rate of 106, occasional PVCs, nonspecific T waves with no acute changes.  She is continued on aspirin  81 mg daily and rosuvastatin  20 mg daily for secondary prevention and progression of coronary artery disease.  Primary hypertension with blood pressure today 120/80.  Blood pressures remain stable.  She is continued on Toprol -XL with increasing the dose to 37.5 mg a day and ramipril  5 mg daily.  She has been encouraged to continue to monitor her blood pressures 1 to 2 hours postmedication administration at home as well.  Mixed hyperlipidemia with an LDL of 78.  She is continued on rosuvastatin  20 mg daily with ongoing management by her PCP.  Type 2 diabetes where she is continued on metformin  and insulin .  States that her blood sugars have been better controlled.  Ongoing management per PCP.  Breast cancer where she recently finished chemotherapy and radiation and currently on immunotherapy.  Prior echocardiogram showed an LVEF that dropped from 50-55% to 45-50%.  Since she is now 3 months post her treatment she has been scheduled for an updated limited study to reassess LVEF.  Sinus tachycardia noted on EKG.  Review of vitals reveals elevated heart rates at most visits.  She had previously been started on Toprol -XL 25 mg daily.  Dosing is being increased to 37.5 mg daily today.  New prescription sent to pharmacy of choice.       Dispo: Patient to return to clinic to see MD/APP in 6 months or sooner if needed for further evaluation.  Signed, Kessler Kopinski, NP

## 2023-09-20 NOTE — Assessment & Plan Note (Addendum)
 pT1aN0M0, stage IA, G2, HER2 positive, ER positive, PR negative, HER2+, and DCIS(+) It was discovered on screening mammogram, initial biopsy showed DCIS only.   -Status post a right lumpectomy.  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 Margins are negative for invasive cancer, but DCIS margin was close. No lymphovascular or perineural invasion. Lymph node biopsy was not performed, Dr. Ebbie has ordered an ultrasound of the right axilla for further evaluation.  If ultrasound is negative, I think it is okay not to have sentinel lymph node biopsy given the small primary tumor. -Given the aggressive nature of her HER2 positive disease, and moderate risk of recurrence, I do recommend adjuvant chemotherapy with Taxol  or Abraxane  once weekly for 12 weeks. -she underwent port placement and right axillary SLN biopsy on 01/12/23 and all 3 nodes were negative   -She started to weekly paclitaxel  and trastuzumab  on February 02, 2023, completed Taxol  on 1/213/2025, plan for adjuvant trastuzumab  for 1 year -she started adjuvant anastrozole  in late April 2025

## 2023-09-20 NOTE — Patient Instructions (Signed)
 Medication Instructions:  Your physician recommends the following medication changes.  INCREASE: Toprol  XL 37.5 mg  *If you need a refill on your cardiac medications before your next appointment, please call your pharmacy*  Lab Work: No labs ordered today  If you have labs (blood work) drawn today and your tests are completely normal, you will receive your results only by: MyChart Message (if you have MyChart) OR A paper copy in the mail If you have any lab test that is abnormal or we need to change your treatment, we will call you to review the results.  Testing/Procedures: Your physician has requested that you have a limited echocardiogram. Echocardiography is a painless test that uses sound waves to create images of your heart. It provides your doctor with information about the size and shape of your heart and how well your heart's chambers and valves are working.   You may receive an ultrasound enhancing agent through an IV if needed to better visualize your heart during the echo. This procedure takes approximately one hour.  There are no restrictions for this procedure.  This will take place at [address] [facility] [address], [place] [address]  Please note: We ask at that you not bring children with you during ultrasound (echo/ vascular) testing. Due to room size and safety concerns, children are not allowed in the ultrasound rooms during exams. Our front office staff cannot provide observation of children in our lobby area while testing is being conducted. An adult accompanying a patient to their appointment will only be allowed in the ultrasound room at the discretion of the ultrasound technician under special circumstances. We apologize for any inconvenience.   Follow-Up: At [facility], you and your health needs are our priority.  As part of our continuing mission to provide you with exceptional heart care, our providers are all part of one team.  This  team includes your primary Cardiologist (physician) and Advanced Practice Providers or APPs (Physician Assistants and Nurse Practitioners) who all work together to provide you with the care you need, when you need it.  Your next appointment:   6 month(s)  Provider:   Deatrice Cage, MD or Tylene Lunch, NP

## 2023-09-26 ENCOUNTER — Telehealth: Payer: Self-pay | Admitting: Hematology

## 2023-09-26 NOTE — Telephone Encounter (Signed)
 Scheduled appointments per 6/24 los. Talked with the patient and she is aware of the made appointments.

## 2023-09-27 ENCOUNTER — Other Ambulatory Visit: Payer: Self-pay

## 2023-10-05 ENCOUNTER — Ambulatory Visit: Attending: Cardiology

## 2023-10-05 DIAGNOSIS — Z0189 Encounter for other specified special examinations: Secondary | ICD-10-CM

## 2023-10-05 DIAGNOSIS — C50411 Malignant neoplasm of upper-outer quadrant of right female breast: Secondary | ICD-10-CM | POA: Diagnosis not present

## 2023-10-05 DIAGNOSIS — Z170 Estrogen receptor positive status [ER+]: Secondary | ICD-10-CM

## 2023-10-05 LAB — ECHOCARDIOGRAM LIMITED
AV Mean grad: 5 mmHg
AV Peak grad: 9.9 mmHg
Ao pk vel: 1.57 m/s
Area-P 1/2: 4.96 cm2
S' Lateral: 4.6 cm

## 2023-10-05 MED ORDER — PERFLUTREN LIPID MICROSPHERE
1.0000 mL | INTRAVENOUS | Status: AC | PRN
  Administered 2023-10-05: 3 mL via INTRAVENOUS

## 2023-10-06 ENCOUNTER — Ambulatory Visit: Payer: Self-pay | Admitting: Cardiology

## 2023-10-06 NOTE — Progress Notes (Signed)
 Heart squeeze noted to be 40-45% which is mildly decreased function (slightly reduced from prior study), mild stiffening of the muscle likely related to age and/or blood pressure, mild leakage noted in the mitral valve.  Continue with Toprol  and ramipril .  Depending on symptoms we will consider additional medication changes on return.

## 2023-10-11 ENCOUNTER — Inpatient Hospital Stay: Attending: Hematology

## 2023-10-11 ENCOUNTER — Inpatient Hospital Stay: Admitting: Hematology

## 2023-10-11 ENCOUNTER — Inpatient Hospital Stay

## 2023-10-11 ENCOUNTER — Other Ambulatory Visit: Payer: Self-pay

## 2023-10-11 ENCOUNTER — Encounter: Payer: Self-pay | Admitting: *Deleted

## 2023-10-11 VITALS — BP 108/56 | HR 109 | Temp 98.0°F | Resp 18 | Ht 66.0 in | Wt 163.3 lb

## 2023-10-11 DIAGNOSIS — Z79811 Long term (current) use of aromatase inhibitors: Secondary | ICD-10-CM | POA: Diagnosis not present

## 2023-10-11 DIAGNOSIS — Z1732 Human epidermal growth factor receptor 2 negative status: Secondary | ICD-10-CM | POA: Diagnosis not present

## 2023-10-11 DIAGNOSIS — D649 Anemia, unspecified: Secondary | ICD-10-CM | POA: Insufficient documentation

## 2023-10-11 DIAGNOSIS — R112 Nausea with vomiting, unspecified: Secondary | ICD-10-CM | POA: Insufficient documentation

## 2023-10-11 DIAGNOSIS — I509 Heart failure, unspecified: Secondary | ICD-10-CM | POA: Insufficient documentation

## 2023-10-11 DIAGNOSIS — C50411 Malignant neoplasm of upper-outer quadrant of right female breast: Secondary | ICD-10-CM

## 2023-10-11 DIAGNOSIS — Z79899 Other long term (current) drug therapy: Secondary | ICD-10-CM | POA: Insufficient documentation

## 2023-10-11 DIAGNOSIS — N3000 Acute cystitis without hematuria: Secondary | ICD-10-CM | POA: Diagnosis not present

## 2023-10-11 DIAGNOSIS — D0511 Intraductal carcinoma in situ of right breast: Secondary | ICD-10-CM

## 2023-10-11 DIAGNOSIS — Z1721 Progesterone receptor positive status: Secondary | ICD-10-CM | POA: Diagnosis not present

## 2023-10-11 DIAGNOSIS — Z170 Estrogen receptor positive status [ER+]: Secondary | ICD-10-CM | POA: Diagnosis not present

## 2023-10-11 LAB — CMP (CANCER CENTER ONLY)
ALT: 12 U/L (ref 0–44)
AST: 12 U/L — ABNORMAL LOW (ref 15–41)
Albumin: 3 g/dL — ABNORMAL LOW (ref 3.5–5.0)
Alkaline Phosphatase: 96 U/L (ref 38–126)
Anion gap: 7 (ref 5–15)
BUN: 7 mg/dL — ABNORMAL LOW (ref 8–23)
CO2: 28 mmol/L (ref 22–32)
Calcium: 8.8 mg/dL — ABNORMAL LOW (ref 8.9–10.3)
Chloride: 94 mmol/L — ABNORMAL LOW (ref 98–111)
Creatinine: 0.51 mg/dL (ref 0.44–1.00)
GFR, Estimated: >60 mL/min (ref >60)
Glucose, Bld: 258 mg/dL — ABNORMAL HIGH (ref 70–99)
Potassium: 4.5 mmol/L (ref 3.5–5.1)
Sodium: 129 mmol/L — ABNORMAL LOW (ref 135–145)
Total Bilirubin: 0.6 mg/dL (ref 0.0–1.2)
Total Protein: 6.9 g/dL (ref 6.5–8.1)

## 2023-10-11 LAB — CBC WITH DIFFERENTIAL (CANCER CENTER ONLY)
Abs Immature Granulocytes: 0.1 K/uL — ABNORMAL HIGH (ref 0.00–0.07)
Basophils Absolute: 0.1 K/uL (ref 0.0–0.1)
Basophils Relative: 1 %
Eosinophils Absolute: 0.7 K/uL — ABNORMAL HIGH (ref 0.0–0.5)
Eosinophils Relative: 6 %
HCT: 31.3 % — ABNORMAL LOW (ref 36.0–46.0)
Hemoglobin: 10.4 g/dL — ABNORMAL LOW (ref 12.0–15.0)
Immature Granulocytes: 1 %
Lymphocytes Relative: 6 %
Lymphs Abs: 0.7 K/uL (ref 0.7–4.0)
MCH: 25.9 pg — ABNORMAL LOW (ref 26.0–34.0)
MCHC: 33.2 g/dL (ref 30.0–36.0)
MCV: 78.1 fL — ABNORMAL LOW (ref 80.0–100.0)
Monocytes Absolute: 1.1 K/uL — ABNORMAL HIGH (ref 0.1–1.0)
Monocytes Relative: 9 %
Neutro Abs: 9 K/uL — ABNORMAL HIGH (ref 1.7–7.7)
Neutrophils Relative %: 77 %
Platelet Count: 654 K/uL — ABNORMAL HIGH (ref 150–400)
RBC: 4.01 MIL/uL (ref 3.87–5.11)
RDW: 15.7 % — ABNORMAL HIGH (ref 11.5–15.5)
WBC Count: 11.6 K/uL — ABNORMAL HIGH (ref 4.0–10.5)
nRBC: 0 % (ref 0.0–0.2)

## 2023-10-11 LAB — RETIC PANEL
Immature Retic Fract: 25.7 % — ABNORMAL HIGH (ref 2.3–15.9)
RBC.: 4.14 MIL/uL (ref 3.87–5.11)
Retic Count, Absolute: 91.1 K/uL (ref 19.0–186.0)
Retic Ct Pct: 2.2 % (ref 0.4–3.1)
Reticulocyte Hemoglobin: 25.2 pg — ABNORMAL LOW (ref >27.9)

## 2023-10-11 LAB — URINALYSIS, COMPLETE (UACMP) WITH MICROSCOPIC
Bacteria, UA: NONE SEEN
Bilirubin Urine: NEGATIVE
Glucose, UA: 150 mg/dL — AB
Ketones, ur: NEGATIVE mg/dL
Leukocytes,Ua: NEGATIVE
Nitrite: NEGATIVE
Protein, ur: NEGATIVE mg/dL
Specific Gravity, Urine: 1.002 — ABNORMAL LOW (ref 1.005–1.030)
pH: 7 (ref 5.0–8.0)

## 2023-10-11 LAB — IRON AND IRON BINDING CAPACITY (CC-WL,HP ONLY)
Iron: 13 ug/dL — ABNORMAL LOW (ref 28–170)
Saturation Ratios: 4 % — ABNORMAL LOW (ref 10.4–31.8)
TIBC: 309 ug/dL (ref 250–450)
UIBC: 296 ug/dL (ref 148–442)

## 2023-10-11 NOTE — Assessment & Plan Note (Signed)
 pT1aN0M0, stage IA, G2, HER2 positive, ER positive, PR negative, HER2+, and DCIS(+) It was discovered on screening mammogram, initial biopsy showed DCIS only.   -Status post a right lumpectomy.  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 Margins are negative for invasive cancer, but DCIS margin was close. No lymphovascular or perineural invasion. Lymph node biopsy was not performed, Dr. Ebbie has ordered an ultrasound of the right axilla for further evaluation.  If ultrasound is negative, I think it is okay not to have sentinel lymph node biopsy given the small primary tumor. -Given the aggressive nature of her HER2 positive disease, and moderate risk of recurrence, I do recommend adjuvant chemotherapy with Taxol  or Abraxane  once weekly for 12 weeks. -she underwent port placement and right axillary SLN biopsy on 01/12/23 and all 3 nodes were negative   -She started to weekly paclitaxel  and trastuzumab  on February 02, 2023, completed Taxol  on 1/213/2025, plan for adjuvant trastuzumab  for 1 year -she started adjuvant anastrozole  in late April 2025

## 2023-10-11 NOTE — Progress Notes (Signed)
 [facility]   Telephone:([phone]) [phone] Fax:(336) [phone]   Clinic Follow up Note   Patient Care Team: Tisovec, [name], MD as PCP - General (Internal Medicine) [name], MD as PCP - Cardiology (Cardiology) [name], MD as Consulting Physician (Hematology) [name], MD as Consulting Physician (General Surgery) [name], MD as Consulting Physician (Radiation Oncology) [name], RN (Inactive) as Oncology Nurse Navigator [name], RN as Oncology Nurse Navigator [name], MD as Consulting Physician (Radiation Oncology)  Date of Service:  10/11/2023  CHIEF COMPLAINT: f/u of breast cancer  CURRENT THERAPY:  Adjuvant anastrozole  and trastuzumab   Oncology History   Malignant neoplasm of upper-outer quadrant of right breast in female, estrogen receptor positive (HCC) pT1aN0M0, stage IA, G2, HER2 positive, ER positive, PR negative, HER2+, and DCIS(+) It was discovered on screening mammogram, initial biopsy showed DCIS only.   -Status post a right lumpectomy.  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 Margins are negative for invasive cancer, but DCIS margin was close. No lymphovascular or perineural invasion. Lymph node biopsy was not performed, Dr. Ebbie has ordered an ultrasound of the right axilla for further evaluation.  If ultrasound is negative, I think it is okay not to have sentinel lymph node biopsy given the small primary tumor. -Given the aggressive nature of her HER2 positive disease, and moderate risk of recurrence, I do recommend adjuvant chemotherapy with Taxol  or Abraxane  once weekly for 12 weeks. -she underwent port placement and right axillary SLN biopsy on 01/12/23 and all 3 nodes were negative   -She started to weekly paclitaxel  and trastuzumab  on February 02, 2023, completed Taxol  on 1/213/2025, plan for adjuvant  trastuzumab  for 1 year -she started adjuvant anastrozole  in late April 2025  Assessment & Plan Breast cancer Undergoing treatment with trastuzumab . Completed 11 cycles with 7 remaining. Concern about trastuzumab 's impact on heart function due to reduced ejection fraction, though not dangerously low. - Hold trastuzumab  treatment today due to her nausea, cough etc and recent low EF on echo  - Consult with cardiologist regarding continuation of trastuzumab  - Consider referral to oncocardiologist Dr. Zenaida for further evaluation if needed   Heart failure with reduced ejection fraction Heart failure with ejection fraction of 40-45%. Concern about trastuzumab 's impact on heart function. Cardiologist recommended continuing current medications (Toprol  and ramipril ). - I reached out to cardiology NP Tylene Lunch regarding heart function and trastuzumab  treatment  Pneumonia Recent pneumonia treated with erythromycin and amoxicillin. Symptoms improved but persistent cough and low oxygen saturation levels, not exceeding 92%. - Monitor oxygen levels at home - Consider contacting primary care physician if symptoms persist  Anemia, unspecified Anemia with hemoglobin level of 10.4, decreased from previous levels. Suspected iron deficiency due to low red blood cell size. No gastrointestinal bleeding reported. - Order lab tests to assess iron levels - Start iron supplementation daily - Refer to gastroenterology for evaluation of potential GI bleeding  Nausea and vomiting Intermittent nausea and vomiting improved with antibiotics but recurred. Vomiting not associated with sour taste, suggesting non-gastroesophageal cause. - Order CT scan of the abdomen to evaluate for underlying causes - Continue Zofran  for nausea as needed  Plan -echo from 10/05/23 reviewed, which showed a EF of 40 to 45% - I sent a message to her cardiologist, will review her case - Will hold trastuzumab  today due to her low EF and  multiple  other symptoms - Will check a UA and urine culture today - CT chest, abdomen pelvis with contrast in next 1 to 2 weeks, to rule out cancer recurrence - Follow-up after CT   SUMMARY OF ONCOLOGIC HISTORY: Oncology History  Malignant neoplasm of upper-outer quadrant of right breast in female, estrogen receptor positive (HCC)  10/27/2022 Cancer Staging   Staging form: Breast, AJCC 8th Edition - Clinical stage from 10/27/2022: Stage 0 (cTis (DCIS), cN0, cM0, G3, ER+, PR-, HER2: Not Assessed) - Signed by [name], MD on 11/03/2022 Stage prefix: Initial diagnosis Histologic grading system: 3 grade system   11/01/2022 Initial Diagnosis   Ductal carcinoma in situ (DCIS) of right breast   11/09/2022 Genetic Testing   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  No other deleterious variants in [employer] Common Hereditary Cancers +RNA Panel. Report date is 11/09/2022.    The [employer] Common Hereditary Cancers + RNA Panel includes sequencing, deletion/duplication, and RNA analysis of the following 48 genes: APC, ATM, AXIN2, BAP1, BARD1, BMPR1A, BRCA1, BRCA2, BRIP1, CDH1, CDK4*, CDKN2A*, CHEK2, CTNNA1, DICER1, EPCAM* (del/dup only), FH, GREM1* (promoter dup analysis only), HOXB13*, KIT*, MBD4*, MEN1, MLH1, MSH2, MSH3, MSH6, MUTYH, NF1, NTHL1, PALB2, PDGFRA*, PMS2, POLD1, POLE, PTEN, RAD51C, RAD51D, SDHA (sequencing only), SDHB, SDHC, SDHD, SMAD4, SMARCA4, STK11, TP53, TSC1, TSC2, VHL.  *Genes without RNA analysis.    12/07/2022 Cancer Staging   Staging form: Breast, AJCC 8th Edition - Pathologic stage from 12/07/2022: Stage IA (pT1a, pN0, cM0, G2, ER+, PR-, HER2+) - Signed by [name], MD on 12/20/2022 Stage prefix: Initial diagnosis Histologic grading system: 3 grade system Residual tumor (R): R0 - None   02/02/2023 -  Chemotherapy   Patient is on Treatment Plan : BREAST Abraxane  + Trastuzumab  q7d / Trastuzumab  q21d        Discussed the use of AI scribe software for  clinical note transcription with the patient, who gave verbal consent to proceed.  History of Present Illness [name] is a 68 year old female with breast cancer who presents for follow-up.  She recently completed treatment for pneumonia with erythromycin and amoxicillin but continues to have a persistent cough and oxygen saturation not exceeding 92%. Nausea and vomiting improved during antibiotic treatment but have recurred, with vomiting described as non-sour. Her ejection fraction has decreased to 40-45% with mild cardiac muscle stiffening. She is on Toprol  and ramipril  and is uncertain if her cardiologist is aware of her trastuzumab  treatment. Her white blood cell count is slightly elevated at 11.6, down from 13.5, and she is more anemic with a hemoglobin of 10.4, suggesting possible iron deficiency. No gastrointestinal bleeding is reported. Her oxygen level recently dropped to 91%, but she can walk a considerable distance without stopping.     All other systems were reviewed with the patient and are negative.  MEDICAL HISTORY:  Past Medical History:  Diagnosis Date   Anemia    Arthritis    Atypical chest pain    a. 05/2018 CTA chest: No PE; b. 05/2018 MV: No ischemia/scar. EF 65%.   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 05/2018 CT Chest: Coronary and Ao atherosclerotic Calcifications.   Diabetes mellitus without complication (HCC)    Diastolic dysfunction    a. 05/2018 Echo: EF 60-65%, impaired relaxation. Nl RVSP.   Dysrhythmia    PVC's   Family  history of adverse reaction to anesthesia    sister-malignant hyperthermia   FH of Malignant hyperthermia    a. sister had malginant hyperthermia   History of kidney stones    History of renal insufficiency    History of skin cancer    Hyperlipemia    Osteoporosis    Pulmonary nodules    a. 05/2018 CT Chest: Small pulm  nodules measuring up to 6mm avg diameter. Rec non-contrast Chest CT in 6-12 mos.   Thyroid  nodule     SURGICAL HISTORY: Past Surgical History:  Procedure Laterality Date   AXILLARY LYMPH NODE BIOPSY Right 01/12/2023   Procedure: RIGHT AXILLARY SENTINEL NODE BIOPSY;  Surgeon: [name], MD;  Location: [facility] OR;  Service: General;  Laterality: Right;  LMA   BREAST BIOPSY Right 10/27/2022   MM RT BREAST BX W LOC DEV 1ST LESION IMAGE BX SPEC STEREO GUIDE 10/27/2022 [facility]   BREAST BIOPSY  12/06/2022   MM RT RADIOACTIVE SEED LOC MAMMO GUIDE 12/06/2022 [facility]   BREAST LUMPECTOMY WITH RADIOACTIVE SEED LOCALIZATION Right 12/07/2022   Procedure: RIGHT BREAST LUMPECTOMY WITH RADIOACTIVE SEED LOCALIZATION;  Surgeon: [name], MD;  Location: [facility] OR;  Service: General;  Laterality: Right;   CESAREAN SECTION     CHOLECYSTECTOMY  2006   lap choli with umb hernia   COLONOSCOPY     HYSTEROSCOPY WITH D & C N/A 12/07/2022   Procedure: DILATATION AND CURETTAGE /HYSTEROSCOPY WITH POLYPECTOMY ENDOMETRIAL BIOPSY;  Surgeon: Okey Leader, MD;  Location: [facility] OR;  Service: Gynecology;  Laterality: N/A;   LIPOMA EXCISION Left 03/15/2014   Procedure: EXCISION LIPOMA LEFT LOWER QUARDRANT ABDOMINAL WALL;  Surgeon: Dann Hummer, MD;  Location: [facility];  Service: General;  Laterality: Left;   PORTACATH PLACEMENT N/A 01/12/2023   Procedure: PORT PLACEMENT WITH ULTRASOUND GUIDANCE;  Surgeon: [name], MD;  Location: [facility] OR;  Service: General;  Laterality: N/A;   THYROIDECTOMY N/A 06/25/2019   Procedure: TOTAL THYROIDECTOMY;  Surgeon: Eletha Boas, MD;  Location: [facility];  Service: General;  Laterality: N/A;   TONSILLECTOMY     TRIGGER FINGER RELEASE  2012   lt small,middle,long   TRIGGER FINGER RELEASE Right 07/14/2017   Procedure: RELEASE TRIGGER FINGER/A-1 PULLEY RIGHT THUMB;  Surgeon: Murrell Kuba, MD;  Location: [facility];  Service: Orthopedics;   Laterality: Right;   UMBILICAL HERNIA REPAIR  2006   with lap choli    I have reviewed the social history and family history with the patient and they are unchanged from previous note.  ALLERGIES:  has no known allergies.  MEDICATIONS:  Current Outpatient Medications  Medication Sig Dispense Refill   anastrozole  (ARIMIDEX ) 1 MG tablet Take 1 tablet (1 mg total) by mouth daily. 30 tablet 2   aspirin  EC 81 MG tablet Take 81 mg by mouth daily.     Insulin  Glargine (BASAGLAR  KWIKPEN) 100 UNIT/ML SOPN Inject 35 Units into the skin at bedtime.     insulin  lispro (HUMALOG) 100 UNIT/ML injection Inject 10-15 Units into the skin 3 (three) times daily before meals. Sliding scale     levothyroxine  (SYNTHROID ) 112 MCG tablet Take 112 mcg by mouth daily before breakfast.     metFORMIN  (GLUCOPHAGE ) 1000 MG tablet Take 1,000 mg by mouth 2 (two) times daily with a meal.     metoprolol  succinate (TOPROL  XL) 25 MG 24 hr tablet Take 1.5 tablets (37.5 mg total) by mouth daily. 135 tablet 3   nitrofurantoin (MACRODANTIN) 100  MG capsule Take 100 mg by mouth daily.     Nutritional Supplements (JUICE PLUS FIBRE PO) Take 3 tablets by mouth daily. Juice Plus Vegetable     Nutritional Supplements (JUICE PLUS FIBRE PO) Take 3 tablets by mouth daily. Juice Plus Fruit     ramipril  (ALTACE ) 5 MG capsule Take 5 mg by mouth daily with breakfast.     rosuvastatin  (CRESTOR ) 20 MG tablet Take 20 mg by mouth daily with supper.      triamcinolone  cream (KENALOG ) 0.1 % Apply 1 Application topically daily as needed (dermatitis).     No current facility-administered medications for this visit.    PHYSICAL EXAMINATION: ECOG PERFORMANCE STATUS: 1 - Symptomatic but completely ambulatory  Vitals:   10/11/23 1310  BP: (!) 108/56  Pulse: (!) 109  Resp: 18  Temp: 98 F (36.7 C)  SpO2: 92%   Wt Readings from Last 3 Encounters:  10/11/23 163 lb 4.8 oz (74.1 kg)  09/20/23 173 lb (78.5 kg)  09/20/23 170 lb (77.1 kg)      GENERAL:alert, no distress and comfortable SKIN: skin color, texture, turgor are normal, no rashes or significant lesions EYES: normal, Conjunctiva are pink and non-injected, sclera clear NECK: supple, thyroid  normal size, non-tender, without nodularity LYMPH:  no palpable lymphadenopathy in the cervical, axillary  LUNGS: clear to auscultation and percussion with normal breathing effort, scattered rhonchi on bilateral lung base, more on the left HEART: regular rate & rhythm and no murmurs and no lower extremity edema ABDOMEN:abdomen soft, non-tender and normal bowel sounds Musculoskeletal:no cyanosis of digits and no clubbing  NEURO: alert & oriented x 3 with fluent speech, no focal motor/sensory deficits  Physical Exam   LABORATORY DATA:  I have reviewed the data as listed    Latest Ref Rng & Units 10/11/2023   12:51 PM 09/20/2023   12:22 PM 07/19/2023   12:36 PM  CBC  WBC 4.0 - 10.5 K/uL 11.6  13.5  5.6   Hemoglobin 12.0 - 15.0 g/dL 89.5  88.7  87.5   Hematocrit 36.0 - 46.0 % 31.3  33.1  37.4   Platelets 150 - 400 K/uL 654  670  330         Latest Ref Rng & Units 10/11/2023   12:51 PM 09/20/2023   12:22 PM 07/19/2023   12:36 PM  CMP  Glucose 70 - 99 mg/dL 741  795  810   BUN 8 - 23 mg/dL 7  7  10    Creatinine 0.44 - 1.00 mg/dL 9.48  9.32  9.42   Sodium 135 - 145 mmol/L 129  128  137   Potassium 3.5 - 5.1 mmol/L 4.5  3.9  3.9   Chloride 98 - 111 mmol/L 94  91  101   CO2 22 - 32 mmol/L 28  28  28    Calcium  8.9 - 10.3 mg/dL 8.8  9.1  9.5   Total Protein 6.5 - 8.1 g/dL 6.9  6.9  6.8   Total Bilirubin 0.0 - 1.2 mg/dL 0.6  0.3  0.4   Alkaline Phos 38 - 126 U/L 96  139  71   AST 15 - 41 U/L 12  34  14   ALT 0 - 44 U/L 12  37  14       RADIOGRAPHIC STUDIES: I have personally reviewed the radiological images as listed and agreed with the findings in the report. No results found.    Orders Placed This Encounter  Procedures  Urine Culture    Standing Status:   Future     Number of Occurrences:   1    Expected Date:   10/11/2023    Expiration Date:   10/10/2024   CT CHEST ABDOMEN PELVIS W CONTRAST    Standing Status:   Future    Number of Occurrences:   1    Expected Date:   10/18/2023    Expiration Date:   10/10/2024    If indicated for the ordered procedure, I authorize the administration of contrast media per Radiology protocol:   Yes    Does the patient have a contrast media/X-ray dye allergy?:   No    Preferred imaging location?:   [facility]    Release to patient:   Immediate    If indicated for the ordered procedure, I authorize the administration of oral contrast media per Radiology protocol:   Yes   Ferritin    Standing Status:   Standing    Number of Occurrences:   20    Expiration Date:   10/10/2024   Retic Panel    Standing Status:   Future    Number of Occurrences:   1    Expiration Date:   10/10/2024   Urinalysis, Complete w Microscopic    Standing Status:   Future    Number of Occurrences:   1    Expiration Date:   10/10/2024   All questions were answered. The patient knows to call the clinic with any problems, questions or concerns. No barriers to learning was detected. The total time spent in the appointment was 30 minutes, including review of chart and various tests results, discussions about plan of care and coordination of care plan     Onita Mattock, MD 10/11/2023

## 2023-10-12 ENCOUNTER — Emergency Department

## 2023-10-12 ENCOUNTER — Telehealth: Payer: Self-pay

## 2023-10-12 ENCOUNTER — Encounter: Payer: Self-pay | Admitting: Emergency Medicine

## 2023-10-12 ENCOUNTER — Inpatient Hospital Stay
Admission: EM | Admit: 2023-10-12 | Discharge: 2023-10-15 | DRG: 194 | Disposition: A | Discharge status: Home / Self Care | Attending: Internal Medicine | Admitting: Internal Medicine

## 2023-10-12 ENCOUNTER — Other Ambulatory Visit: Payer: Self-pay

## 2023-10-12 DIAGNOSIS — Z833 Family history of diabetes mellitus: Secondary | ICD-10-CM

## 2023-10-12 DIAGNOSIS — I5022 Chronic systolic (congestive) heart failure: Secondary | ICD-10-CM | POA: Diagnosis present

## 2023-10-12 DIAGNOSIS — R0602 Shortness of breath: Secondary | ICD-10-CM | POA: Diagnosis present

## 2023-10-12 DIAGNOSIS — Z79899 Other long term (current) drug therapy: Secondary | ICD-10-CM | POA: Diagnosis not present

## 2023-10-12 DIAGNOSIS — Z853 Personal history of malignant neoplasm of breast: Secondary | ICD-10-CM | POA: Diagnosis not present

## 2023-10-12 DIAGNOSIS — Z794 Long term (current) use of insulin: Secondary | ICD-10-CM

## 2023-10-12 DIAGNOSIS — Z79890 Hormone replacement therapy (postmenopausal): Secondary | ICD-10-CM

## 2023-10-12 DIAGNOSIS — Z9049 Acquired absence of other specified parts of digestive tract: Secondary | ICD-10-CM

## 2023-10-12 DIAGNOSIS — R0902 Hypoxemia: Secondary | ICD-10-CM | POA: Diagnosis present

## 2023-10-12 DIAGNOSIS — K769 Liver disease, unspecified: Secondary | ICD-10-CM | POA: Diagnosis present

## 2023-10-12 DIAGNOSIS — J189 Pneumonia, unspecified organism: Principal | ICD-10-CM | POA: Diagnosis present

## 2023-10-12 DIAGNOSIS — Z8249 Family history of ischemic heart disease and other diseases of the circulatory system: Secondary | ICD-10-CM | POA: Diagnosis not present

## 2023-10-12 DIAGNOSIS — E119 Type 2 diabetes mellitus without complications: Secondary | ICD-10-CM | POA: Diagnosis present

## 2023-10-12 DIAGNOSIS — I2510 Atherosclerotic heart disease of native coronary artery without angina pectoris: Secondary | ICD-10-CM | POA: Diagnosis present

## 2023-10-12 DIAGNOSIS — Z803 Family history of malignant neoplasm of breast: Secondary | ICD-10-CM

## 2023-10-12 DIAGNOSIS — Z79811 Long term (current) use of aromatase inhibitors: Secondary | ICD-10-CM

## 2023-10-12 DIAGNOSIS — Z7982 Long term (current) use of aspirin: Secondary | ICD-10-CM | POA: Diagnosis not present

## 2023-10-12 DIAGNOSIS — E871 Hypo-osmolality and hyponatremia: Secondary | ICD-10-CM | POA: Diagnosis present

## 2023-10-12 DIAGNOSIS — J9601 Acute respiratory failure with hypoxia: Secondary | ICD-10-CM | POA: Diagnosis not present

## 2023-10-12 DIAGNOSIS — C50411 Malignant neoplasm of upper-outer quadrant of right female breast: Secondary | ICD-10-CM | POA: Diagnosis present

## 2023-10-12 DIAGNOSIS — Z808 Family history of malignant neoplasm of other organs or systems: Secondary | ICD-10-CM | POA: Diagnosis not present

## 2023-10-12 DIAGNOSIS — Z923 Personal history of irradiation: Secondary | ICD-10-CM

## 2023-10-12 DIAGNOSIS — Z7984 Long term (current) use of oral hypoglycemic drugs: Secondary | ICD-10-CM | POA: Diagnosis not present

## 2023-10-12 DIAGNOSIS — Z170 Estrogen receptor positive status [ER+]: Secondary | ICD-10-CM

## 2023-10-12 DIAGNOSIS — E785 Hyperlipidemia, unspecified: Secondary | ICD-10-CM | POA: Diagnosis present

## 2023-10-12 DIAGNOSIS — Z1152 Encounter for screening for COVID-19: Secondary | ICD-10-CM

## 2023-10-12 DIAGNOSIS — I110 Hypertensive heart disease with heart failure: Secondary | ICD-10-CM | POA: Diagnosis present

## 2023-10-12 LAB — URINALYSIS, W/ REFLEX TO CULTURE (INFECTION SUSPECTED)
Bacteria, UA: NONE SEEN
Bilirubin Urine: NEGATIVE
Glucose, UA: >=500 mg/dL — AB
Ketones, ur: NEGATIVE mg/dL
Nitrite: NEGATIVE
Protein, ur: NEGATIVE mg/dL
Specific Gravity, Urine: 1 — ABNORMAL LOW (ref 1.005–1.030)
Squamous Epithelial / HPF: 0 /HPF (ref 0–5)
pH: 6 (ref 5.0–8.0)

## 2023-10-12 LAB — HEPATIC FUNCTION PANEL
ALT: 16 U/L (ref 0–44)
AST: 17 U/L (ref 15–41)
Albumin: 2.5 g/dL — ABNORMAL LOW (ref 3.5–5.0)
Alkaline Phosphatase: 101 U/L (ref 38–126)
Bilirubin, Direct: 0.1 mg/dL (ref 0.0–0.2)
Indirect Bilirubin: 0.8 mg/dL (ref 0.3–0.9)
Total Bilirubin: 0.9 mg/dL (ref 0.0–1.2)
Total Protein: 7.2 g/dL (ref 6.5–8.1)

## 2023-10-12 LAB — URINE CULTURE

## 2023-10-12 LAB — LIPASE, BLOOD: Lipase: 25 U/L (ref 11–51)

## 2023-10-12 LAB — CBG MONITORING, ED
Glucose-Capillary: 303 mg/dL — ABNORMAL HIGH (ref 70–99)
Glucose-Capillary: 303 mg/dL — ABNORMAL HIGH (ref 70–99)

## 2023-10-12 LAB — RESP PANEL BY RT-PCR (RSV, FLU A&B, COVID)  RVPGX2
Influenza A by PCR: NEGATIVE
Influenza B by PCR: NEGATIVE
Resp Syncytial Virus by PCR: NEGATIVE
SARS Coronavirus 2 by RT PCR: NEGATIVE

## 2023-10-12 LAB — CBC
HCT: 32.6 % — ABNORMAL LOW (ref 36.0–46.0)
Hemoglobin: 10.5 g/dL — ABNORMAL LOW (ref 12.0–15.0)
MCH: 25.7 pg — ABNORMAL LOW (ref 26.0–34.0)
MCHC: 32.2 g/dL (ref 30.0–36.0)
MCV: 79.7 fL — ABNORMAL LOW (ref 80.0–100.0)
Platelets: 754 K/uL — ABNORMAL HIGH (ref 150–400)
RBC: 4.09 MIL/uL (ref 3.87–5.11)
RDW: 15.6 % — ABNORMAL HIGH (ref 11.5–15.5)
WBC: 13.1 K/uL — ABNORMAL HIGH (ref 4.0–10.5)
nRBC: 0 % (ref 0.0–0.2)

## 2023-10-12 LAB — BASIC METABOLIC PANEL WITH GFR
Anion gap: 12 (ref 5–15)
BUN: 7 mg/dL — ABNORMAL LOW (ref 8–23)
CO2: 24 mmol/L (ref 22–32)
Calcium: 9.1 mg/dL (ref 8.9–10.3)
Chloride: 92 mmol/L — ABNORMAL LOW (ref 98–111)
Creatinine, Ser: 0.57 mg/dL (ref 0.44–1.00)
GFR, Estimated: >60 mL/min (ref >60)
Glucose, Bld: 268 mg/dL — ABNORMAL HIGH (ref 70–99)
Potassium: 4 mmol/L (ref 3.5–5.1)
Sodium: 128 mmol/L — ABNORMAL LOW (ref 135–145)

## 2023-10-12 LAB — FERRITIN: Ferritin: 227 ng/mL (ref 11–307)

## 2023-10-12 LAB — BRAIN NATRIURETIC PEPTIDE: B Natriuretic Peptide: 102.8 pg/mL — ABNORMAL HIGH (ref 0.0–100.0)

## 2023-10-12 LAB — PROCALCITONIN: Procalcitonin: <0.1 ng/mL

## 2023-10-12 LAB — TROPONIN I (HIGH SENSITIVITY): Troponin I (High Sensitivity): 6 ng/L (ref <18)

## 2023-10-12 MED ORDER — ONDANSETRON HCL 4 MG PO TABS: 4.0000 mg | ORAL_TABLET | Freq: Four times a day (QID) | ORAL | Status: DC | PRN

## 2023-10-12 MED ORDER — ACETAMINOPHEN 650 MG RE SUPP: 650.0000 mg | Freq: Four times a day (QID) | RECTAL | Status: DC | PRN

## 2023-10-12 MED ORDER — INSULIN ASPART 100 UNIT/ML IJ SOLN
0.0000 [IU] | Freq: Three times a day (TID) | INTRAMUSCULAR | Status: DC
  Administered 2023-10-12: 7 [IU] via SUBCUTANEOUS
  Administered 2023-10-13: 5 [IU] via SUBCUTANEOUS
  Administered 2023-10-13: 3 [IU] via SUBCUTANEOUS
  Administered 2023-10-13: 5 [IU] via SUBCUTANEOUS
  Administered 2023-10-14: 3 [IU] via SUBCUTANEOUS
  Administered 2023-10-14: 7 [IU] via SUBCUTANEOUS
  Administered 2023-10-14: 3 [IU] via SUBCUTANEOUS
  Administered 2023-10-15: 5 [IU] via SUBCUTANEOUS
  Filled 2023-10-12 (×8): qty 1

## 2023-10-12 MED ORDER — METOPROLOL SUCCINATE ER 25 MG PO TB24
37.5000 mg | ORAL_TABLET | Freq: Every day | ORAL | Status: DC
  Administered 2023-10-12 – 2023-10-15 (×4): 37.5 mg via ORAL
  Filled 2023-10-12 (×3): qty 2
  Filled 2023-10-12: qty 1.5
  Filled 2023-10-12 (×2): qty 2

## 2023-10-12 MED ORDER — INSULIN ASPART 100 UNIT/ML IJ SOLN
0.0000 [IU] | Freq: Every day | INTRAMUSCULAR | Status: DC
  Administered 2023-10-12 – 2023-10-14 (×3): 4 [IU] via SUBCUTANEOUS
  Filled 2023-10-12 (×3): qty 1

## 2023-10-12 MED ORDER — SODIUM CHLORIDE 0.9 % IV SOLN
100.0000 mg | Freq: Once | INTRAVENOUS | Status: AC
  Administered 2023-10-12: 100 mg via INTRAVENOUS
  Filled 2023-10-12: qty 100

## 2023-10-12 MED ORDER — LEVOTHYROXINE SODIUM 112 MCG PO TABS
112.0000 ug | ORAL_TABLET | Freq: Every day | ORAL | Status: DC
  Administered 2023-10-13 – 2023-10-15 (×3): 112 ug via ORAL
  Filled 2023-10-12 (×3): qty 1

## 2023-10-12 MED ORDER — SODIUM CHLORIDE 0.9 % IV SOLN
500.0000 mg | INTRAVENOUS | Status: DC
  Administered 2023-10-12 – 2023-10-14 (×3): 500 mg via INTRAVENOUS
  Filled 2023-10-12 (×4): qty 5

## 2023-10-12 MED ORDER — SODIUM CHLORIDE 0.9 % IV SOLN
2.0000 g | INTRAVENOUS | Status: DC
  Administered 2023-10-13 – 2023-10-14 (×2): 2 g via INTRAVENOUS
  Filled 2023-10-12 (×3): qty 20

## 2023-10-12 MED ORDER — INSULIN GLARGINE-YFGN 100 UNIT/ML ~~LOC~~ SOLN
35.0000 [IU] | Freq: Every day | SUBCUTANEOUS | Status: DC
  Administered 2023-10-12 – 2023-10-13 (×2): 35 [IU] via SUBCUTANEOUS
  Filled 2023-10-12 (×2): qty 0.35

## 2023-10-12 MED ORDER — RAMIPRIL 5 MG PO CAPS
5.0000 mg | ORAL_CAPSULE | Freq: Every day | ORAL | Status: DC
  Administered 2023-10-13 – 2023-10-15 (×3): 5 mg via ORAL
  Filled 2023-10-12 (×3): qty 1

## 2023-10-12 MED ORDER — FERROUS SULFATE 325 (65 FE) MG PO TABS
325.0000 mg | ORAL_TABLET | Freq: Every day | ORAL | Status: DC
  Administered 2023-10-13 – 2023-10-15 (×3): 325 mg via ORAL
  Filled 2023-10-12 (×5): qty 1

## 2023-10-12 MED ORDER — TRIAMCINOLONE ACETONIDE 0.1 % EX CREA
1.0000 | TOPICAL_CREAM | Freq: Every day | CUTANEOUS | Status: DC | PRN
  Filled 2023-10-12: qty 15

## 2023-10-12 MED ORDER — ENOXAPARIN SODIUM 40 MG/0.4ML IJ SOSY
40.0000 mg | PREFILLED_SYRINGE | INTRAMUSCULAR | Status: DC
  Administered 2023-10-12 – 2023-10-14 (×3): 40 mg via SUBCUTANEOUS
  Filled 2023-10-12 (×3): qty 0.4

## 2023-10-12 MED ORDER — ACETAMINOPHEN 325 MG PO TABS: 650.0000 mg | ORAL_TABLET | Freq: Four times a day (QID) | ORAL | Status: DC | PRN

## 2023-10-12 MED ORDER — POLYETHYLENE GLYCOL 3350 17 G PO PACK
17.0000 g | PACK | Freq: Every day | ORAL | Status: DC | PRN
Start: 2023-10-12 — End: 2023-10-15

## 2023-10-12 MED ORDER — ONDANSETRON HCL 4 MG/2ML IJ SOLN: 4.0000 mg | Freq: Four times a day (QID) | INTRAMUSCULAR | Status: DC | PRN

## 2023-10-12 MED ORDER — SODIUM CHLORIDE 0.9 % IV SOLN
1.0000 g | Freq: Once | INTRAVENOUS | Status: AC
  Administered 2023-10-12: 1 g via INTRAVENOUS
  Filled 2023-10-12: qty 10

## 2023-10-12 MED ORDER — IOHEXOL 350 MG/ML SOLN
100.0000 mL | Freq: Once | INTRAVENOUS | Status: AC | PRN
  Administered 2023-10-12: 100 mL via INTRAVENOUS

## 2023-10-12 MED ORDER — ROSUVASTATIN CALCIUM 10 MG PO TABS
20.0000 mg | ORAL_TABLET | Freq: Every day | ORAL | Status: DC
  Administered 2023-10-12 – 2023-10-14 (×3): 20 mg via ORAL
  Filled 2023-10-12 (×2): qty 2
  Filled 2023-10-12: qty 1

## 2023-10-12 NOTE — ED Notes (Signed)
 Patient ambulatory to x-ray.

## 2023-10-12 NOTE — ED Triage Notes (Signed)
 Patient to ED via POV for SOB. Pt states she recently finished abx for pneumonia. States O2 85% on RA at home. Currently getting shots for breast cancer. Speaking in full sentences without difficulty. NAD noted.

## 2023-10-12 NOTE — Telephone Encounter (Signed)
 Spoke with pt via telephone regarding previous conversation about oxygen saturation.  Stated that Dr. Lanny would like for the pt to go to the ED for further evaluation since the pt is sating in the 80's at rest.  Stated the ED will most likely do a CT Scan to see what is causing the pt's oxygenation to be low.  Pt verbalized understanding and stated she will go to [facility] ED since it's closer to her home.

## 2023-10-12 NOTE — H&P (Signed)
 History and Physical    [name] FMW:[id number] DOB: 1956/03/08 DOA: 10/12/2023  DOS: the patient was seen and examined on 10/12/2023  PCP: Tisovec, [name], MD   Patient coming from: Home  I have personally briefly reviewed patient's old medical records in [facility]  Chief Complaint: Shortness of breath and low oxygen at 85% on room air at home.  HPI: [name] is a pleasant 68 y.o. female with medical history significant for breast cancer s/p lumpectomy, radiation therapy, chemo on oral treatment who presented to [facility] emergency room for shortness of breath and low oxygen saturations at home.  Patient stated that her oxygen went down to low 80s.  She was recently treated by her oncologist for pneumonia with possible erythromycin and amoxicillin.  She took those medication for 5 days but did not get better.  She also complains of some cough some nausea and 1 episode of vomiting.  She denies any fever or chills.  She has a history of heart failure with ejection fraction 40 to 45% and was told that she will have to check her ejection fraction with echocardiogram every 3 to 4 months while she is on chemotherapy. After 5 days of treatment with oral antibiotic, she still had shortness of breath and some cough, she came into the emergency room for evaluation.  ED Course: Upon arrival to the ED, patient is found to have hypoxemia around 85% on room air.  CT scan of the chest showed no PE but showed, but did show bilateral pneumonia and CT A/P showed small scattered liver hypodensities, no acute findings.  Hospitalist service was consulted for evaluation for admission for acquired pneumonia, failed outpatient treatment with amoxicillin and azithromycin .  Review of Systems:  ROS  All other systems negative except as noted in the HPI.  Past Medical History:  Diagnosis Date   Anemia    Arthritis    Atypical chest pain    a. 05/2018 CTA chest: No PE; b. 05/2018 MV: No  ischemia/scar. EF 65%.   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 05/2018 CT Chest: Coronary and Ao atherosclerotic Calcifications.   Diabetes mellitus without complication (HCC)    Diastolic dysfunction    a. 05/2018 Echo: EF 60-65%, impaired relaxation. Nl RVSP.   Dysrhythmia    PVC's   Family history of adverse reaction to anesthesia    sister-malignant hyperthermia   FH of Malignant hyperthermia    a. sister had malginant hyperthermia   History of kidney stones    History of renal insufficiency    History of skin cancer    Hyperlipemia    Osteoporosis    Pulmonary nodules    a. 05/2018 CT Chest: Small pulm nodules measuring up to 6mm avg diameter. Rec non-contrast Chest CT in 6-12 mos.   Thyroid  nodule     Past Surgical History:  Procedure Laterality Date   AXILLARY LYMPH NODE BIOPSY Right 01/12/2023   Procedure: RIGHT AXILLARY SENTINEL NODE BIOPSY;  Surgeon: [name], MD;  Location: [facility] OR;  Service: General;  Laterality: Right;  LMA   BREAST BIOPSY Right 10/27/2022   MM RT BREAST BX W LOC DEV 1ST LESION IMAGE BX SPEC STEREO GUIDE 10/27/2022 [facility]   BREAST BIOPSY  12/06/2022   MM RT RADIOACTIVE SEED LOC MAMMO GUIDE 12/06/2022 [facility]   BREAST LUMPECTOMY  WITH RADIOACTIVE SEED LOCALIZATION Right 12/07/2022   Procedure: RIGHT BREAST LUMPECTOMY WITH RADIOACTIVE SEED LOCALIZATION;  Surgeon: [name], MD;  Location: [facility] OR;  Service: General;  Laterality: Right;   CESAREAN SECTION     CHOLECYSTECTOMY  2006   lap choli with umb hernia   COLONOSCOPY     HYSTEROSCOPY WITH D & C N/A 12/07/2022   Procedure: DILATATION AND CURETTAGE /HYSTEROSCOPY WITH POLYPECTOMY ENDOMETRIAL BIOPSY;  Surgeon: Okey Leader, MD;  Location: [facility] OR;  Service: Gynecology;  Laterality: N/A;   LIPOMA EXCISION Left 03/15/2014   Procedure: EXCISION LIPOMA LEFT LOWER  QUARDRANT ABDOMINAL WALL;  Surgeon: Dann Hummer, MD;  Location: [facility];  Service: General;  Laterality: Left;   PORTACATH PLACEMENT N/A 01/12/2023   Procedure: PORT PLACEMENT WITH ULTRASOUND GUIDANCE;  Surgeon: [name], MD;  Location: [facility] OR;  Service: General;  Laterality: N/A;   THYROIDECTOMY N/A 06/25/2019   Procedure: TOTAL THYROIDECTOMY;  Surgeon: Eletha Boas, MD;  Location: [facility] ORS;  Service: General;  Laterality: N/A;   TONSILLECTOMY     TRIGGER FINGER RELEASE  2012   lt small,middle,long   TRIGGER FINGER RELEASE Right 07/14/2017   Procedure: RELEASE TRIGGER FINGER/A-1 PULLEY RIGHT THUMB;  Surgeon: Murrell Kuba, MD;  Location: [facility];  Service: Orthopedics;  Laterality: Right;   UMBILICAL HERNIA REPAIR  2006   with lap choli     reports that she has never smoked. She has never used smokeless tobacco. She reports that she does not drink alcohol and does not use drugs.  No Known Allergies  Family History  Problem Relation Age of Onset   Heart disease Mother    Throat cancer Mother    Diabetes Father    Breast cancer Sister 57       d. 30; mets   Breast cancer Sister 72       d. 16s; mets   Heart disease Other     Prior to Admission medications   Medication Sig Start Date End Date Taking? Authorizing Provider  anastrozole  (ARIMIDEX ) 1 MG tablet Take 1 tablet (1 mg total) by mouth daily. 07/19/23   [name], MD  aspirin  EC 81 MG tablet Take 81 mg by mouth daily.    [provider]  Insulin  Glargine (BASAGLAR  KWIKPEN) 100 UNIT/ML SOPN Inject 35 Units into the skin at bedtime. 11/26/16   [provider]  insulin  lispro (HUMALOG) 100 UNIT/ML injection Inject 10-15 Units into the skin 3 (three) times daily before meals. Sliding scale    [provider]  levothyroxine  (SYNTHROID ) 112 MCG tablet Take 112 mcg by mouth daily before breakfast.    [provider]  metFORMIN  (GLUCOPHAGE ) 1000 MG tablet  Take 1,000 mg by mouth 2 (two) times daily with a meal.    [provider]  metoprolol  succinate (TOPROL  XL) 25 MG 24 hr tablet Take 1.5 tablets (37.5 mg total) by mouth daily. 09/20/23 09/19/24  Gerard Frederick, NP  nitrofurantoin (MACRODANTIN) 100 MG capsule Take 100 mg by mouth daily.    [provider]  Nutritional Supplements (JUICE PLUS FIBRE PO) Take 3 tablets by mouth daily. Juice Plus Vegetable    [provider]  Nutritional Supplements (JUICE PLUS FIBRE PO) Take 3 tablets by mouth daily. Juice Plus Fruit    [provider]  ramipril  (ALTACE ) 5 MG capsule Take 5 mg by mouth daily with breakfast.    [provider]  rosuvastatin  (CRESTOR ) 20 MG tablet Take 20 mg  by mouth daily with supper.     [provider]  triamcinolone  cream (KENALOG ) 0.1 % Apply 1 Application topically daily as needed (dermatitis).    [provider]    Physical Exam: Vitals:   10/12/23 1059 10/12/23 1100  BP: (!) 142/84   Pulse: (!) 112   Resp: 19   Temp: 98.4 F (36.9 C)   TempSrc: Oral   SpO2: 95%   Weight:  73.9 kg  Height:  5' 6 (1.676 m)    Physical Exam   Constitutional: Alert, awake, calm, comfortable HEENT: Neck supple Respiratory: Clear to auscultation B/L, no wheezing, no rales.  Cardiovascular: Regular rate and rhythm, no murmurs / rubs / gallops. No extremity edema. 2+ pedal pulses. No carotid bruits.  Abdomen: Soft, no tenderness, Bowel sounds positive.  Musculoskeletal: no clubbing / cyanosis. Good ROM, no contractures. Normal muscle tone.  Skin: no rashes, lesions, ulcers. Neurologic: CN 2-12 grossly intact. Sensation intact, No focal deficit identified Psychiatric: Alert and oriented x 3. Normal mood.    Labs on Admission: I have personally reviewed following labs and imaging studies  CBC: Recent Labs  Lab 10/11/23 1251 10/12/23 1103  WBC 11.6* 13.1*  NEUTROABS 9.0*  --   HGB 10.4* 10.5*  HCT 31.3* 32.6*  MCV  78.1* 79.7*  PLT 654* 754*   Basic Metabolic Panel: Recent Labs  Lab 10/11/23 1251 10/12/23 1103  NA 129* 128*  K 4.5 4.0  CL 94* 92*  CO2 28 24  GLUCOSE 258* 268*  BUN 7* 7*  CREATININE 0.51 0.57  CALCIUM  8.8* 9.1   GFR: Estimated Creatinine Clearance: 68.2 mL/min (by C-G formula based on SCr of 0.57 mg/dL). Liver Function Tests: Recent Labs  Lab 10/11/23 1251 10/12/23 1103  AST 12* 17  ALT 12 16  ALKPHOS 96 101  BILITOT 0.6 0.9  PROT 6.9 7.2  ALBUMIN 3.0* 2.5*   Recent Labs  Lab 10/12/23 1103  LIPASE 25   No results for input(s): AMMONIA in the last 168 hours. Coagulation Profile: No results for input(s): INR, PROTIME in the last 168 hours. Cardiac Enzymes: Recent Labs  Lab 10/12/23 1103  TROPONINIHS 6   BNP (last 3 results) Recent Labs    10/12/23 1103  BNP 102.8*    Anemia Panel: Recent Labs    10/11/23 1443 10/11/23 1445 10/11/23 1447  FERRITIN  --  227  --   TIBC  --   --  309  IRON  --   --  13*  RETICCTPCT 2.2  --   --    Urine analysis:    Component Value Date/Time   COLORURINE STRAW (A) 10/12/2023 1151   APPEARANCEUR CLEAR (A) 10/12/2023 1151   LABSPEC 1.000 (L) 10/12/2023 1151   PHURINE 6.0 10/12/2023 1151   GLUCOSEU >=500 (A) 10/12/2023 1151   HGBUR SMALL (A) 10/12/2023 1151   BILIRUBINUR NEGATIVE 10/12/2023 1151   KETONESUR NEGATIVE 10/12/2023 1151   PROTEINUR NEGATIVE 10/12/2023 1151   NITRITE NEGATIVE 10/12/2023 1151   LEUKOCYTESUR TRACE (A) 10/12/2023 1151    Radiological Exams on Admission: I have personally reviewed images No results found.  EKG: My personal interpretation of EKG shows: Sinus tachycardia with PVCs, normal ST elevation    Assessment/Plan Principal Problem:   Community acquired pneumonia Active Problems:   Malignant neoplasm of upper-outer quadrant of right breast in female, estrogen receptor positive (HCC)    Assessment and Plan:  68 year old female with history of breast cancer on  chemo and anastrozole ,  history of heart failure with reduced ejection fraction, diabetes, hypothyroidism, HTN who came into the ED for progressive shortness of breath and failed outpatient treatment for pneumonia.  1.  Community acquired pneumonia, failed outpatient treatment - Patient received amoxicillin and ?  Azithromycin  for 5 days without improvement - She will be admitted to hospital as inpatient as she has possible sepsis meeting with tachycardia at 112, tachypnea, 20, hypoxemia around 85% on room air and leukocytosis at 13.1 due to pneumonia on CT scan. - She will be given IV ceftriaxone  and azithromycin , oxygen to maintain saturation more than 90% and will wean off when able. - Will follow the cultures  2.  Breast cancer s/p chemo, radiation, lumpectomy on oral anastrozole  - Further plan per oncologist as outpatient  3.  Type 2 diabetes - Patient takes insulin  Basaglar  and Humalog at home. - Will place her on Lantus  35 units at bedtime and insulin  sliding scale  4.  Hypertension - Will continue her metoprolol  and continue to monitor blood pressure  5.  Hypothyroidism Continue levothyroxine  112 mcg  6.  Patient says she has a history of irregular heartbeat - May be related to atrial fibrillation - She is not on anticoagulation - She is on metoprolol  - Heart rate has been fairly stable - Will continue to monitor  7.  CHF - She carries the diagnosis of congestive heart failure with reduced ejection fraction - She is not volume overloaded and I do not see any diuretics at home medication list - Will continue to monitor her symptoms   DVT prophylaxis: Lovenox  Code Status: Full Code Family Communication: Husband at bedside  Disposition Plan: Home  Consults called: None  Admission status: Inpatient, Med-Surg   Nena Rebel, MD [facility] 10/12/2023, 2:17 PM

## 2023-10-12 NOTE — Telephone Encounter (Signed)
 Pt called stating that her oxygen SATS this morning has been around 86% to 89% at rest this morning.  Pt stated she contacted her PCP this morning since they initially diagnosed the pt w/pneumonia and prescribed Abx.  Pt stated she finished her Abx but still have a residual cough.  Pt's PCP instructed pt to contact Dr. Demetra office for further care for the pt's pneumonia.  Asked pt if she would be able to come in today for a 6 min walk w/wo oxygen.  Pt stated Yes.  Stated this nurse will make Dr. Lanny aware of the pt's oxygen SATS and will f/u with pt on Dr. Demetra recommendations.

## 2023-10-12 NOTE — ED Provider Notes (Signed)
 [facility] Provider Note    Event Date/Time   First MD Initiated Contact with Patient 10/12/23 1134     (approximate)   History   Shortness of Breath   HPI  [name] is a 68 y.o. female with history of breast cancer on chemo, history of heart failure with reduced ejection fraction presenting with shortness of breath and hypoxia.  Had reported that oxygen sat would desat to the 80s.  She denies any chest pain, fever.  Does have a cough.  Consulted her oncologist who sent her in for evaluation.  Has also noted intermittent nausea vomiting, last episode was yesterday as well as a 15 pound weight loss.  No abdominal pain, she denies any diarrhea, states that she is having bowel movement and is passing flatus is normal.  Her oncologist did put in a CT chest abdomen pelvis and told her to mention this to the ED so that she can get the additional imaging done.  On independent review, she was seen by her oncologist yesterday, has history of breast cancer on chemo, heart failure ejection fraction of 40 to 45%, did have a recent pneumonia, treated with erythromycin and amoxicillin.  Symptoms had improved but reports persistent cough with oxygen desaturations.     Physical Exam   Triage Vital Signs: ED Triage Vitals  Encounter Vitals Group     BP 10/12/23 1059 (!) 142/84     Girls Systolic BP Percentile --      Girls Diastolic BP Percentile --      Boys Systolic BP Percentile --      Boys Diastolic BP Percentile --      Pulse Rate 10/12/23 1059 (!) 112     Resp 10/12/23 1059 19     Temp 10/12/23 1059 98.4 F (36.9 C)     Temp Source 10/12/23 1059 Oral     SpO2 10/12/23 1059 95 %     Weight 10/12/23 1100 163 lb (73.9 kg)     Height 10/12/23 1100 5' 6 (1.676 m)     Head Circumference --      Peak Flow --      Pain Score 10/12/23 1100 5     Pain Loc --      Pain Education --      Exclude from Growth Chart --     Most recent vital signs: Vitals:    10/12/23 1059  BP: (!) 142/84  Pulse: (!) 112  Resp: 19  Temp: 98.4 F (36.9 C)  SpO2: 95%     General: Awake, no distress.  CV:  Good peripheral perfusion.  Resp:  Normal effort.  Clear Abd:  No distention.  Soft nontender Other:  Trace lower extremity edema that appears symmetrical, no unilateral calf swelling or tenderness   ED Results / Procedures / Treatments   Labs (all labs ordered are listed, but only abnormal results are displayed) Labs Reviewed  BASIC METABOLIC PANEL WITH GFR - Abnormal; Notable for the following components:      Result Value   Sodium 128 (*)    Chloride 92 (*)    Glucose, Bld 268 (*)    BUN 7 (*)    All other components within normal limits  CBC - Abnormal; Notable for the following components:   WBC 13.1 (*)    Hemoglobin 10.5 (*)    HCT 32.6 (*)    MCV 79.7 (*)    MCH 25.7 (*)  RDW 15.6 (*)    Platelets 754 (*)    All other components within normal limits  BRAIN NATRIURETIC PEPTIDE - Abnormal; Notable for the following components:   B Natriuretic Peptide 102.8 (*)    All other components within normal limits  HEPATIC FUNCTION PANEL - Abnormal; Notable for the following components:   Albumin 2.5 (*)    All other components within normal limits  URINALYSIS, W/ REFLEX TO CULTURE (INFECTION SUSPECTED) - Abnormal; Notable for the following components:   Color, Urine STRAW (*)    APPearance CLEAR (*)    Specific Gravity, Urine 1.000 (*)    Glucose, UA >=500 (*)    Hgb urine dipstick SMALL (*)    Leukocytes,Ua TRACE (*)    All other components within normal limits  RESP PANEL BY RT-PCR (RSV, FLU A&B, COVID)  RVPGX2  LIPASE, BLOOD  PROCALCITONIN  CBC  CREATININE, SERUM  TROPONIN I (HIGH SENSITIVITY)  TROPONIN I (HIGH SENSITIVITY)     EKG  EKG shows, sinus tachycardia with frequent PVCs, rate of 111, normal QS, normal QTc, no obvious ischemic ST elevation, T wave flattening in 3, not significantly changed compared to prior      PROCEDURES:  Critical Care performed: No  Procedures   MEDICATIONS ORDERED IN ED: Medications  cefTRIAXone  (ROCEPHIN ) 1 g in sodium chloride  0.9 % 100 mL IVPB (1 g Intravenous New Bag/Given 10/12/23 1301)  doxycycline  (VIBRAMYCIN ) 100 mg in sodium chloride  0.9 % 250 mL IVPB (has no administration in time range)  cefTRIAXone  (ROCEPHIN ) 2 g in sodium chloride  0.9 % 100 mL IVPB (has no administration in time range)  azithromycin  (ZITHROMAX ) 500 mg in sodium chloride  0.9 % 250 mL IVPB (has no administration in time range)  enoxaparin  (LOVENOX ) injection 40 mg (has no administration in time range)  acetaminophen  (TYLENOL ) tablet 650 mg (has no administration in time range)    Or  acetaminophen  (TYLENOL ) suppository 650 mg (has no administration in time range)  polyethylene glycol (MIRALAX  / GLYCOLAX ) packet 17 g (has no administration in time range)  ondansetron  (ZOFRAN ) tablet 4 mg (has no administration in time range)    Or  ondansetron  (ZOFRAN ) injection 4 mg (has no administration in time range)  iohexol  (OMNIPAQUE ) 350 MG/ML injection 100 mL (100 mLs Intravenous Contrast Given 10/12/23 1232)     IMPRESSION / MDM / ASSESSMENT AND PLAN / ED COURSE  I reviewed the triage vital signs and the nursing notes.                              Differential diagnosis includes, but is not limited to, PE, pneumonia, viral illness, atypical ACS, CHF.  For her nausea vomiting, consider partial SBO, ileus, constipation, mets, UTI.  Will get labs, EKG, troponin, chest x-ray, CT PE study.  CT abdomen pelvis.  Patient's presentation is most consistent with acute presentation with potential threat to life or bodily function.  Independent interpretation of labs and imaging below.  Given that chest x-ray shows bilateral pneumonia, will start her on IV ceftriaxone  and doxycycline .  Discussed with patient about imaging and lab results including incidental findings.  Given her hypoxia especially when she  moves and bilateral pneumonia failing outpatient therapy, she will need to be admitted for further management and IV antibiotics.  Consult to hospitalist was agreeable with plan for admission and will evaluate the patient.  She is admitted.  The patient is on the cardiac monitor  to evaluate for evidence of arrhythmia and/or significant heart rate changes.   Clinical Course as of 10/12/23 1310  Wed Oct 12, 2023  1232 Independent review of labs, UA shows trace leukocytes but no WBCs or bacteria, not consistent with UTI, lipase is normal, LFTs are normal, BNP is mildly elevated, troponin is not elevated, she has mild hyponatremia mild hypochloremia, creatinine is normal. [TT]  1246 Due to downtime, received imaging via paper, showed pneumonia bilaterally right greater than left. [TT]  1258 Received call from radiology with preliminary findings given downtime.  CT chest no PE, did show b/l pneumonia, CT a/p small scatter liver hypodensities, no acute findings [TT]    Clinical Course User Index [TT] Waymond Lorelle Cummins, MD     FINAL CLINICAL IMPRESSION(S) / ED DIAGNOSES   Final diagnoses:  Shortness of breath  Pneumonia of both lungs due to infectious organism, unspecified part of lung  Hypoxia  Lesion of liver     Rx / DC Orders   ED Discharge Orders     None        Note:  This document was prepared using Dragon voice recognition software and may include unintentional dictation errors.    Waymond Lorelle Cummins, MD 10/12/23 1310

## 2023-10-13 ENCOUNTER — Telehealth: Payer: Self-pay

## 2023-10-13 ENCOUNTER — Inpatient Hospital Stay: Admitting: *Deleted

## 2023-10-13 ENCOUNTER — Other Ambulatory Visit (HOSPITAL_COMMUNITY): Payer: Self-pay

## 2023-10-13 ENCOUNTER — Other Ambulatory Visit: Payer: Self-pay

## 2023-10-13 ENCOUNTER — Encounter: Payer: Self-pay | Admitting: Hematology

## 2023-10-13 ENCOUNTER — Telehealth: Payer: Self-pay | Admitting: Hematology

## 2023-10-13 ENCOUNTER — Telehealth (HOSPITAL_COMMUNITY): Payer: Self-pay | Admitting: Pharmacy Technician

## 2023-10-13 DIAGNOSIS — J189 Pneumonia, unspecified organism: Secondary | ICD-10-CM | POA: Diagnosis not present

## 2023-10-13 LAB — COMPREHENSIVE METABOLIC PANEL WITH GFR
ALT: 15 U/L (ref 0–44)
AST: 13 U/L — ABNORMAL LOW (ref 15–41)
Albumin: 2.1 g/dL — ABNORMAL LOW (ref 3.5–5.0)
Alkaline Phosphatase: 87 U/L (ref 38–126)
Anion gap: 10 (ref 5–15)
BUN: 7 mg/dL — ABNORMAL LOW (ref 8–23)
CO2: 26 mmol/L (ref 22–32)
Calcium: 8.3 mg/dL — ABNORMAL LOW (ref 8.9–10.3)
Chloride: 94 mmol/L — ABNORMAL LOW (ref 98–111)
Creatinine, Ser: 0.59 mg/dL (ref 0.44–1.00)
GFR, Estimated: >60 mL/min (ref >60)
Glucose, Bld: 217 mg/dL — ABNORMAL HIGH (ref 70–99)
Potassium: 3.6 mmol/L (ref 3.5–5.1)
Sodium: 130 mmol/L — ABNORMAL LOW (ref 135–145)
Total Bilirubin: 0.7 mg/dL (ref 0.0–1.2)
Total Protein: 6.6 g/dL (ref 6.5–8.1)

## 2023-10-13 LAB — GLUCOSE, CAPILLARY
Glucose-Capillary: 297 mg/dL — ABNORMAL HIGH (ref 70–99)
Glucose-Capillary: 276 mg/dL — ABNORMAL HIGH (ref 70–99)
Glucose-Capillary: 242 mg/dL — ABNORMAL HIGH (ref 70–99)
Glucose-Capillary: 312 mg/dL — ABNORMAL HIGH (ref 70–99)

## 2023-10-13 LAB — TROPONIN I (HIGH SENSITIVITY): Troponin I (High Sensitivity): 3 ng/L (ref <18)

## 2023-10-13 LAB — CBC
HCT: 29.2 % — ABNORMAL LOW (ref 36.0–46.0)
Hemoglobin: 9.5 g/dL — ABNORMAL LOW (ref 12.0–15.0)
MCH: 25.4 pg — ABNORMAL LOW (ref 26.0–34.0)
MCHC: 32.5 g/dL (ref 30.0–36.0)
MCV: 78.1 fL — ABNORMAL LOW (ref 80.0–100.0)
Platelets: 661 K/uL — ABNORMAL HIGH (ref 150–400)
RBC: 3.74 MIL/uL — ABNORMAL LOW (ref 3.87–5.11)
RDW: 15.7 % — ABNORMAL HIGH (ref 11.5–15.5)
WBC: 9.4 K/uL (ref 4.0–10.5)
nRBC: 0 % (ref 0.0–0.2)

## 2023-10-13 LAB — HIV ANTIBODY (ROUTINE TESTING W REFLEX): HIV Screen 4th Generation wRfx: NONREACTIVE

## 2023-10-13 LAB — HEMOGLOBIN A1C
Hgb A1c MFr Bld: 7.5 % — ABNORMAL HIGH (ref 4.8–5.6)
Mean Plasma Glucose: 168.55 mg/dL

## 2023-10-13 LAB — PROTIME-INR
INR: 1.1 (ref 0.8–1.2)
Prothrombin Time: 14.7 s (ref 11.4–15.2)

## 2023-10-13 MED ORDER — INSULIN ASPART 100 UNIT/ML IJ SOLN
5.0000 [IU] | Freq: Three times a day (TID) | INTRAMUSCULAR | Status: DC
  Administered 2023-10-13 – 2023-10-15 (×5): 5 [IU] via SUBCUTANEOUS
  Filled 2023-10-13 (×5): qty 1

## 2023-10-13 NOTE — Telephone Encounter (Signed)
 Spoke with pt's husband to make him aware that Dr. Lanny has cancelled the pt's CT Scan since the CT Scan was done yesterday at [facility].  Pt is admitted in [facility].  Instructed pt to contact Dr. Demetra office once the pt is d/c from the hospital so the pt can be scheduled for lab and f/u appt.  Pt's husband verbalized understanding and had no further questions.

## 2023-10-13 NOTE — Inpatient Diabetes Management (Signed)
 Inpatient Diabetes Program Recommendations  AACE/ADA: New Consensus Statement on Inpatient Glycemic Control (2015)  Target Ranges:  Prepandial:   less than 140 mg/dL      Peak postprandial:   less than 180 mg/dL (1-2 hours)      Critically ill patients:  140 - 180 mg/dL   Lab Results  Component Value Date   GLUCAP 276 (H) 10/13/2023   HGBA1C 7.5 (H) 10/13/2023    Latest Reference Range & Units 10/12/23 16:44 10/12/23 21:48 10/13/23 08:55 10/13/23 11:33  Glucose-Capillary 70 - 99 mg/dL 696 (H) 696 (H) 702 (H) 276 (H)  (H): Data is abnormally high  Diabetes history: DM2 Outpatient Diabetes medications: Basaglar  35 daily, Humalog 10-15 tid MC, Met 1 gm bid  Current orders for Inpatient glycemic control:  Semglee  35 daily,  Novolog  0-15 tid, 0-5 hs  Inpatient Diabetes Program Recommendations:   Please consider: -Add Novolog  5 units tid meal coverage if eats 50% meal  Thank you, Dagoberto E. Fred Hammes, RN, MSN, CDCES  Diabetes Coordinator Inpatient Glycemic Control Team Team Pager [phone] (8am-5pm) 10/13/2023 1:30 PM

## 2023-10-13 NOTE — Telephone Encounter (Signed)
 Pharmacy Patient Advocate Encounter  Insurance verification completed.    The patient is insured through [facility]. Patient has Medicare and is not eligible for a copay card, but may be able to apply for patient assistance or Medicare RX Payment Plan (Patient Must reach out to their plan, if eligible for payment plan), if available.    Ran test claim for Farxiga 10mg  tablets and the current 30 day co-pay is $25.00.  Ran test claim for Jardiance 10mg  tablets and the current 30 day co-pay is $25.00.   This test claim was processed through [facility]- copay amounts may vary at other pharmacies due to pharmacy/plan contracts, or as the patient moves through the different stages of their insurance plan.

## 2023-10-13 NOTE — Progress Notes (Signed)
 Progress Note   Patient: [name] FMW:[id number] DOB: 1955-06-25 DOA: 10/12/2023     1 DOS: the patient was seen and examined on 10/13/2023   Brief hospital course: [name] is a pleasant 68 y.o. female with medical history significant for breast cancer s/p lumpectomy, radiation therapy, chemo on oral treatment who presented to [facility] emergency room for shortness of breath and low oxygen saturations at home.  Patient stated that her oxygen went down to low 80s.  She was recently treated by her oncologist for pneumonia with possible erythromycin and amoxicillin.  She took those medication for 5 days but did not get better.  She also complains of some cough some nausea and 1 episode of vomiting.  She denies any fever or chills.  She has a history of heart failure with ejection fraction 40 to 45% and was told that she will have to check her ejection fraction with echocardiogram every 3 to 4 months while she is on chemotherapy. After 5 days of treatment with oral antibiotic, she still had shortness of breath and some cough, she came into the emergency room for evaluation.   ED Course: Upon arrival to the ED, patient is found to have hypoxemia around 85% on room air.  CT scan of the chest showed no PE but showed, but did show bilateral pneumonia and CT A/P showed small scattered liver hypodensities, no acute findings.  Hospitalist service was consulted for evaluation for admission for acquired pneumonia, failed outpatient treatment with amoxicillin and azithromycin .    Assessment and Plan:    1.  Community acquired pneumonia, failed outpatient treatment Continue ceftriaxone  and azithromycin  Follow-up on culture results Wean off oxygen as tolerated Patient does not use oxygen at home   2.  Breast cancer s/p chemo, radiation, lumpectomy on oral anastrozole  - Further plan per oncologist as outpatient   3.  Type 2 diabetes - Patient takes insulin  Basaglar  and Humalog at home. - Will  place her on Lantus  35 units at bedtime and insulin  sliding scale   4.  Hypertension - Will continue her metoprolol  and continue to monitor blood pressure   5.  Hypothyroidism Continue levothyroxine  112 mcg   6.  Patient says she has a history of irregular heartbeat - May be related to atrial fibrillation - She is not on anticoagulation - She is on metoprolol  - Heart rate has been fairly stable - Will continue to monitor   7.  CHF - She carries the diagnosis of congestive heart failure with reduced ejection fraction - She is not volume overloaded and I do not see any diuretics at home medication list Continue to monitor her symptoms     DVT prophylaxis: Lovenox  Code Status: Full Code Family Communication: Husband, daughter and son-in-law at bedside  Disposition Plan: Home    Subjective:  Patient on 2 L of intranasal oxygen Admits to improvement in respiratory function Still coughing but improving Denies nausea vomiting abdominal pain chest pain  Physical Exam: Constitutional: Elderly female laying in bed on intranasal oxygen HEENT: Neck supple Respiratory: Clear to auscultation B/L, no wheezing, no rales.  Cardiovascular: Regular rate and rhythm, no murmurs / rubs / gallops. No extremity edema. 2+ pedal pulses. No carotid bruits.  Abdomen: Soft, no tenderness, Bowel sounds positive.  Musculoskeletal: no clubbing / cyanosis. Good ROM, no contractures. Normal muscle tone.  Skin: no rashes, lesions, ulcers. Neurologic: CN 2-12 grossly intact. Sensation intact, No focal deficit identified Psychiatric: Alert and oriented x 3. Normal mood.  Vitals:   10/13/23 0130 10/13/23 0200 10/13/23 0304 10/13/23 0725  BP: 108/65 105/66 136/75 133/71  Pulse: 94 94  96  Resp: (!) 26 (!) 23 20 (!) 22  Temp:   98.3 F (36.8 C) 98.2 F (36.8 C)  TempSrc:   Oral Oral  SpO2: 95% 93% 94% 95%  Weight:      Height:        Data Reviewed: Chest x-ray reviewed showing findings of  pneumonia on the right    Latest Ref Rng & Units 10/13/2023    4:57 AM 10/12/2023   11:03 AM 10/11/2023   12:51 PM  CBC  WBC 4.0 - 10.5 K/uL 9.4  13.1  11.6   Hemoglobin 12.0 - 15.0 g/dL 9.5  89.4  89.5   Hematocrit 36.0 - 46.0 % 29.2  32.6  31.3   Platelets 150 - 400 K/uL 661  754  654        Latest Ref Rng & Units 10/13/2023    4:57 AM 10/12/2023   11:03 AM 10/11/2023   12:51 PM  BMP  Glucose 70 - 99 mg/dL 782  731  741   BUN 8 - 23 mg/dL 7  7  7    Creatinine 0.44 - 1.00 mg/dL 9.40  9.42  9.48   Sodium 135 - 145 mmol/L 130  128  129   Potassium 3.5 - 5.1 mmol/L 3.6  4.0  4.5   Chloride 98 - 111 mmol/L 94  92  94   CO2 22 - 32 mmol/L 26  24  28    Calcium  8.9 - 10.3 mg/dL 8.3  9.1  8.8      Time spent: 55 minutes  Author: Drue ONEIDA Potter, MD 10/13/2023 5:06 PM  For on call review [email or web address].

## 2023-10-13 NOTE — TOC CM/SW Note (Signed)
 Transition of Care [facility]) - Inpatient Brief Assessment   Patient Details  Name: JAHNAYA BRANSCOME MRN: [id number] Date of Birth: 1955/08/28  Transition of Care [facility]) CM/SW Contact:    Corean ONEIDA Haddock, RN Phone Number: 10/13/2023, 11:54 AM   Clinical Narrative:   Transition of Care [facility]) Screening Note   Patient Details  Name: CREASIE LACOSSE Date of Birth: [birth year]   Transition of Care [facility]) CM/SW Contact:    Corean ONEIDA Haddock, RN Phone Number: 10/13/2023, 11:54 AM    Transition of Care Department [facility]) has reviewed patient and no TOC needs have been identified at this time. If new patient transition needs arise, please place a TOC consult.  Patient currently on 2L acute O2 with plan to wean. Please consult TOC of home O2 indicated, or other needs arise     Transition of Care Asessment: Insurance and Status: Insurance coverage has been reviewed Patient has primary care physician: Yes     Prior/Current Home Services: No current home services Social Drivers of Health Review: SDOH reviewed no interventions necessary Readmission risk has been reviewed: Yes Transition of care needs: transition of care needs identified, TOC will continue to follow

## 2023-10-13 NOTE — Telephone Encounter (Signed)
 Scheduled appointment per staff message. Called and left a VM with appointment details for the patient.

## 2023-10-14 DIAGNOSIS — J189 Pneumonia, unspecified organism: Secondary | ICD-10-CM | POA: Diagnosis not present

## 2023-10-14 LAB — BASIC METABOLIC PANEL WITH GFR
Anion gap: 10 (ref 5–15)
BUN: 7 mg/dL — ABNORMAL LOW (ref 8–23)
CO2: 26 mmol/L (ref 22–32)
Calcium: 8.2 mg/dL — ABNORMAL LOW (ref 8.9–10.3)
Chloride: 93 mmol/L — ABNORMAL LOW (ref 98–111)
Creatinine, Ser: 0.52 mg/dL (ref 0.44–1.00)
GFR, Estimated: >60 mL/min (ref >60)
Glucose, Bld: 237 mg/dL — ABNORMAL HIGH (ref 70–99)
Potassium: 3.8 mmol/L (ref 3.5–5.1)
Sodium: 129 mmol/L — ABNORMAL LOW (ref 135–145)

## 2023-10-14 LAB — GLUCOSE, CAPILLARY
Glucose-Capillary: 247 mg/dL — ABNORMAL HIGH (ref 70–99)
Glucose-Capillary: 323 mg/dL — ABNORMAL HIGH (ref 70–99)
Glucose-Capillary: 241 mg/dL — ABNORMAL HIGH (ref 70–99)
Glucose-Capillary: 314 mg/dL — ABNORMAL HIGH (ref 70–99)

## 2023-10-14 LAB — CBC WITH DIFFERENTIAL/PLATELET
Abs Immature Granulocytes: 0.13 K/uL — ABNORMAL HIGH (ref 0.00–0.07)
Basophils Absolute: 0.1 K/uL (ref 0.0–0.1)
Basophils Relative: 1 %
Eosinophils Absolute: 0.8 K/uL — ABNORMAL HIGH (ref 0.0–0.5)
Eosinophils Relative: 9 %
HCT: 31.3 % — ABNORMAL LOW (ref 36.0–46.0)
Hemoglobin: 10 g/dL — ABNORMAL LOW (ref 12.0–15.0)
Immature Granulocytes: 2 %
Lymphocytes Relative: 9 %
Lymphs Abs: 0.8 K/uL (ref 0.7–4.0)
MCH: 25.3 pg — ABNORMAL LOW (ref 26.0–34.0)
MCHC: 31.9 g/dL (ref 30.0–36.0)
MCV: 79 fL — ABNORMAL LOW (ref 80.0–100.0)
Monocytes Absolute: 1 K/uL (ref 0.1–1.0)
Monocytes Relative: 11 %
Neutro Abs: 6.1 K/uL (ref 1.7–7.7)
Neutrophils Relative %: 68 %
Platelets: 645 K/uL — ABNORMAL HIGH (ref 150–400)
RBC: 3.96 MIL/uL (ref 3.87–5.11)
RDW: 15.6 % — ABNORMAL HIGH (ref 11.5–15.5)
WBC: 9 K/uL (ref 4.0–10.5)
nRBC: 0 % (ref 0.0–0.2)

## 2023-10-14 LAB — OSMOLALITY: Osmolality: 281 mosm/kg (ref 275–295)

## 2023-10-14 LAB — SODIUM, URINE, RANDOM: Sodium, Ur: 22 mmol/L

## 2023-10-14 MED ORDER — GUAIFENESIN-CODEINE 100-10 MG/5ML PO SOLN
10.0000 mL | Freq: Four times a day (QID) | ORAL | Status: DC | PRN
  Filled 2023-10-14: qty 10

## 2023-10-14 MED ORDER — GLUCERNA SHAKE PO LIQD
237.0000 mL | Freq: Three times a day (TID) | ORAL | Status: DC
  Administered 2023-10-14 (×3): 237 mL via ORAL

## 2023-10-14 MED ORDER — INSULIN GLARGINE-YFGN 100 UNIT/ML ~~LOC~~ SOLN
25.0000 [IU] | Freq: Two times a day (BID) | SUBCUTANEOUS | Status: DC
  Administered 2023-10-14 – 2023-10-15 (×2): 25 [IU] via SUBCUTANEOUS
  Filled 2023-10-14 (×2): qty 0.25

## 2023-10-14 MED ORDER — INSULIN GLARGINE-YFGN 100 UNIT/ML ~~LOC~~ SOLN
15.0000 [IU] | Freq: Once | SUBCUTANEOUS | Status: AC
  Administered 2023-10-14: 15 [IU] via SUBCUTANEOUS
  Filled 2023-10-14: qty 0.15

## 2023-10-14 NOTE — Progress Notes (Signed)
 Progress Note   Patient: [name] FMW:[id number] DOB: 1955-12-27 DOA: 10/12/2023     2 DOS: the patient was seen and examined on 10/14/2023    Brief hospital course: [name] is a pleasant 68 y.o. female with medical history significant for breast cancer s/p lumpectomy, radiation therapy, chemo on oral treatment who presented to [facility] emergency room for shortness of breath and low oxygen saturations at home.  Patient stated that her oxygen went down to low 80s.  She was recently treated by her oncologist for pneumonia with possible erythromycin and amoxicillin.  She took those medication for 5 days but did not get better.  She also complains of some cough some nausea and 1 episode of vomiting.  She denies any fever or chills.  She has a history of heart failure with ejection fraction 40 to 45% and was told that she will have to check her ejection fraction with echocardiogram every 3 to 4 months while she is on chemotherapy. After 5 days of treatment with oral antibiotic, she still had shortness of breath and some cough, she came into the emergency room for evaluation.   ED Course: Upon arrival to the ED, patient is found to have hypoxemia around 85% on room air.  CT scan of the chest showed no PE but showed, but did show bilateral pneumonia and CT A/P showed small scattered liver hypodensities, no acute findings.  Hospitalist service was consulted for evaluation for admission for acquired pneumonia, failed outpatient treatment with amoxicillin and azithromycin .     Assessment and Plan:       Community acquired pneumonia, failed outpatient treatment Continue ceftriaxone  and azithromycin  Follow-up on culture results Wean off oxygen as tolerated Patient does not use oxygen at home    Hyponatremia Follow-up on urine electrolytes  Breast cancer s/p chemo, radiation, lumpectomy on oral anastrozole  - Further plan per oncologist as outpatient    Type 2 diabetes - Patient takes  insulin  Basaglar  and Humalog at home. - Will place her on Lantus  35 units at bedtime and insulin  sliding scale    Hypertension - Will continue her metoprolol  and continue to monitor blood pressure    Hypothyroidism Continue levothyroxine  112 mcg   Patient says she has a history of irregular heartbeat - May be related to atrial fibrillation - She is not on anticoagulation - She is on metoprolol  - Heart rate has been fairly stable - Will continue to monitor    CHF - She carries the diagnosis of congestive heart failure with reduced ejection fraction - She is not volume overloaded and I do not see any diuretics at home medication list Continue to monitor her symptoms     DVT prophylaxis: Lovenox  Code Status: Full Code Family Communication: Husband, daughter and son-in-law at bedside  Disposition Plan: Home      Subjective:  Patient's oxygen was taken off to room air today She admits to improvement in respiratory function Sodium level noted to be low for which urine electrolytes and osmolarity requested   Physical Exam: Constitutional: Elderly female laying in bed on intranasal oxygen HEENT: Neck supple Respiratory: Clear to auscultation B/L, no wheezing, no rales.  Cardiovascular: Regular rate and rhythm, no murmurs / rubs / gallops. No extremity edema. 2+ pedal pulses. No carotid bruits.  Abdomen: Soft, no tenderness, Bowel sounds positive.  Musculoskeletal: no clubbing / cyanosis. Good ROM, no contractures. Normal muscle tone.  Skin: no rashes, lesions, ulcers. Neurologic: CN 2-12 grossly intact. Sensation intact, No focal  deficit identified Psychiatric: Alert and oriented x 3. Normal mood.      Data Reviewed:    Latest Ref Rng & Units 10/14/2023    5:02 AM 10/13/2023    4:57 AM 10/12/2023   11:03 AM  CBC  WBC 4.0 - 10.5 K/uL 9.0  9.4  13.1   Hemoglobin 12.0 - 15.0 g/dL 89.9  9.5  89.4   Hematocrit 36.0 - 46.0 % 31.3  29.2  32.6   Platelets 150 - 400 K/uL 645  661   754        Latest Ref Rng & Units 10/14/2023    5:02 AM 10/13/2023    4:57 AM 10/12/2023   11:03 AM  BMP  Glucose 70 - 99 mg/dL 762  782  731   BUN 8 - 23 mg/dL 7  7  7    Creatinine 0.44 - 1.00 mg/dL 9.47  9.40  9.42   Sodium 135 - 145 mmol/L 129  130  128   Potassium 3.5 - 5.1 mmol/L 3.8  3.6  4.0   Chloride 98 - 111 mmol/L 93  94  92   CO2 22 - 32 mmol/L 26  26  24    Calcium  8.9 - 10.3 mg/dL 8.2  8.3  9.1      Vitals:   10/13/23 2012 10/14/23 0730 10/14/23 1200 10/14/23 1456  BP: 126/66 112/69  116/66  Pulse: 100 99  100  Resp: 16 16  18   Temp: 98.6 F (37 C) 97.9 F (36.6 C)  98.6 F (37 C)  TempSrc:  Oral  Oral  SpO2: 96% 94% 93% 94%  Weight:      Height:         Author: Drue ONEIDA Potter, MD 10/14/2023 5:32 PM  For on call review [email or web address].

## 2023-10-14 NOTE — Plan of Care (Signed)
  Problem: Education: Goal: Ability to describe self-care measures that may prevent or decrease complications (Diabetes Survival Skills Education) will improve Outcome: Progressing Goal: Individualized Educational Video(s) Outcome: Progressing   Problem: Coping: Goal: Ability to adjust to condition or change in health will improve Outcome: Progressing   Problem: Fluid Volume: Goal: Ability to maintain a balanced intake and output will improve Outcome: Progressing   Problem: Health Behavior/Discharge Planning: Goal: Ability to identify and utilize available resources and services will improve Outcome: Progressing Goal: Ability to manage health-related needs will improve Outcome: Progressing   Problem: Metabolic: Goal: Ability to maintain appropriate glucose levels will improve Outcome: Progressing   Problem: Nutritional: Goal: Maintenance of adequate nutrition will improve Outcome: Progressing Goal: Progress toward achieving an optimal weight will improve Outcome: Progressing   Problem: Tissue Perfusion: Goal: Adequacy of tissue perfusion will improve Outcome: Progressing   Problem: Education: Goal: Knowledge of General Education information will improve Description: Including pain rating scale, medication(s)/side effects and non-pharmacologic comfort measures Outcome: Progressing   Problem: Health Behavior/Discharge Planning: Goal: Ability to manage health-related needs will improve Outcome: Progressing   Problem: Clinical Measurements: Goal: Ability to maintain clinical measurements within normal limits will improve Outcome: Progressing Goal: Will remain free from infection Outcome: Progressing Goal: Diagnostic test results will improve Outcome: Progressing Goal: Respiratory complications will improve Outcome: Progressing Goal: Cardiovascular complication will be avoided Outcome: Progressing   Problem: Activity: Goal: Risk for activity intolerance will  decrease Outcome: Progressing   Problem: Nutrition: Goal: Adequate nutrition will be maintained Outcome: Progressing   Problem: Coping: Goal: Level of anxiety will decrease Outcome: Progressing   Problem: Elimination: Goal: Will not experience complications related to bowel motility Outcome: Progressing Goal: Will not experience complications related to urinary retention Outcome: Progressing   Problem: Pain Managment: Goal: General experience of comfort will improve and/or be controlled Outcome: Progressing   Problem: Safety: Goal: Ability to remain free from injury will improve Outcome: Progressing   Problem: Skin Integrity: Goal: Risk for impaired skin integrity will decrease Outcome: Progressing   Problem: Activity: Goal: Ability to tolerate increased activity will improve Outcome: Progressing   Problem: Clinical Measurements: Goal: Ability to maintain a body temperature in the normal range will improve Outcome: Progressing   Problem: Respiratory: Goal: Ability to maintain adequate ventilation will improve Outcome: Progressing Goal: Ability to maintain a clear airway will improve Outcome: Progressing

## 2023-10-14 NOTE — Care Management Important Message (Signed)
 Important Message  Patient Details  Name: [name] MRN: [id number] Date of Birth: April 04, 1955   Important Message Given:  Yes - Medicare IM     [name] 10/14/2023, 12:05 PM

## 2023-10-15 ENCOUNTER — Other Ambulatory Visit: Payer: Self-pay | Admitting: Hematology

## 2023-10-15 DIAGNOSIS — J9601 Acute respiratory failure with hypoxia: Secondary | ICD-10-CM

## 2023-10-15 LAB — CBC WITH DIFFERENTIAL/PLATELET
Abs Immature Granulocytes: 0.21 K/uL — ABNORMAL HIGH (ref 0.00–0.07)
Basophils Absolute: 0.1 K/uL (ref 0.0–0.1)
Basophils Relative: 1 %
Eosinophils Absolute: 0.9 K/uL — ABNORMAL HIGH (ref 0.0–0.5)
Eosinophils Relative: 9 %
HCT: 29.4 % — ABNORMAL LOW (ref 36.0–46.0)
Hemoglobin: 9.9 g/dL — ABNORMAL LOW (ref 12.0–15.0)
Immature Granulocytes: 2 %
Lymphocytes Relative: 9 %
Lymphs Abs: 0.9 K/uL (ref 0.7–4.0)
MCH: 25.8 pg — ABNORMAL LOW (ref 26.0–34.0)
MCHC: 33.7 g/dL (ref 30.0–36.0)
MCV: 76.8 fL — ABNORMAL LOW (ref 80.0–100.0)
Monocytes Absolute: 1.1 K/uL — ABNORMAL HIGH (ref 0.1–1.0)
Monocytes Relative: 11 %
Neutro Abs: 6.6 K/uL (ref 1.7–7.7)
Neutrophils Relative %: 68 %
Platelets: 663 K/uL — ABNORMAL HIGH (ref 150–400)
RBC: 3.83 MIL/uL — ABNORMAL LOW (ref 3.87–5.11)
RDW: 15.7 % — ABNORMAL HIGH (ref 11.5–15.5)
WBC: 9.9 K/uL (ref 4.0–10.5)
nRBC: 0 % (ref 0.0–0.2)

## 2023-10-15 LAB — GLUCOSE, CAPILLARY: Glucose-Capillary: 262 mg/dL — ABNORMAL HIGH (ref 70–99)

## 2023-10-15 LAB — BASIC METABOLIC PANEL WITH GFR
Anion gap: 11 (ref 5–15)
BUN: 7 mg/dL — ABNORMAL LOW (ref 8–23)
CO2: 27 mmol/L (ref 22–32)
Calcium: 8.4 mg/dL — ABNORMAL LOW (ref 8.9–10.3)
Chloride: 93 mmol/L — ABNORMAL LOW (ref 98–111)
Creatinine, Ser: 0.66 mg/dL (ref 0.44–1.00)
GFR, Estimated: >60 mL/min (ref >60)
Glucose, Bld: 259 mg/dL — ABNORMAL HIGH (ref 70–99)
Potassium: 3.4 mmol/L — ABNORMAL LOW (ref 3.5–5.1)
Sodium: 131 mmol/L — ABNORMAL LOW (ref 135–145)

## 2023-10-15 LAB — OSMOLALITY, URINE: Osmolality, Ur: 187 mosm/kg — ABNORMAL LOW (ref 300–900)

## 2023-10-15 MED ORDER — CEFUROXIME AXETIL 500 MG PO TABS: 500.0000 mg | ORAL_TABLET | Freq: Two times a day (BID) | ORAL | 0 refills | Status: AC

## 2023-10-15 MED ORDER — AZITHROMYCIN 250 MG PO TABS
500.0000 mg | ORAL_TABLET | Freq: Every day | ORAL | Status: DC
  Administered 2023-10-15: 500 mg via ORAL
  Filled 2023-10-15 (×3): qty 2

## 2023-10-15 MED ORDER — AZITHROMYCIN 250 MG PO TABS: 500.0000 mg | ORAL_TABLET | Freq: Every day | ORAL | 0 refills | Status: AC

## 2023-10-15 MED ORDER — POTASSIUM CHLORIDE CRYS ER 20 MEQ PO TBCR
40.0000 meq | EXTENDED_RELEASE_TABLET | Freq: Once | ORAL | Status: AC
  Administered 2023-10-15: 40 meq via ORAL
  Filled 2023-10-15: qty 2

## 2023-10-15 MED ORDER — CEFUROXIME AXETIL 500 MG PO TABS
500.0000 mg | ORAL_TABLET | Freq: Two times a day (BID) | ORAL | Status: DC
  Administered 2023-10-15: 500 mg via ORAL
  Filled 2023-10-15 (×2): qty 1

## 2023-10-15 NOTE — Plan of Care (Signed)
 Problem: Education: Goal: Ability to describe self-care measures that may prevent or decrease complications (Diabetes Survival Skills Education) will improve 10/15/2023 1035 by [name], RN Outcome: Adequate for Discharge 10/15/2023 1035 by [name], RN Outcome: Adequate for Discharge Goal: Individualized Educational Video(s) 10/15/2023 1035 by [name], RN Outcome: Adequate for Discharge 10/15/2023 1035 by [name], RN Outcome: Adequate for Discharge   Problem: Coping: Goal: Ability to adjust to condition or change in health will improve 10/15/2023 1035 by [name], RN Outcome: Adequate for Discharge 10/15/2023 1035 by [name], RN Outcome: Adequate for Discharge   Problem: Fluid Volume: Goal: Ability to maintain a balanced intake and output will improve 10/15/2023 1035 by [name], RN Outcome: Adequate for Discharge 10/15/2023 1035 by [name], RN Outcome: Adequate for Discharge   Problem: Health Behavior/Discharge Planning: Goal: Ability to identify and utilize available resources and services will improve 10/15/2023 1035 by [name], RN Outcome: Adequate for Discharge 10/15/2023 1035 by [name], RN Outcome: Adequate for Discharge Goal: Ability to manage health-related needs will improve 10/15/2023 1035 by [name], RN Outcome: Adequate for Discharge 10/15/2023 1035 by [name], RN Outcome: Adequate for Discharge   Problem: Metabolic: Goal: Ability to maintain appropriate glucose levels will improve 10/15/2023 1035 by [name], RN Outcome: Adequate for Discharge 10/15/2023 1035 by [name], RN Outcome: Adequate for Discharge   Problem: Nutritional: Goal: Maintenance of adequate nutrition will improve 10/15/2023 1035 by [name], RN Outcome: Adequate for Discharge 10/15/2023 1035 by [name], RN Outcome: Adequate for Discharge Goal: Progress toward achieving an  optimal weight will improve 10/15/2023 1035 by [name], RN Outcome: Adequate for Discharge 10/15/2023 1035 by [name], RN Outcome: Adequate for Discharge   Problem: Tissue Perfusion: Goal: Adequacy of tissue perfusion will improve 10/15/2023 1035 by [name], RN Outcome: Adequate for Discharge 10/15/2023 1035 by [name], RN Outcome: Adequate for Discharge   Problem: Education: Goal: Knowledge of General Education information will improve Description: Including pain rating scale, medication(s)/side effects and non-pharmacologic comfort measures 10/15/2023 1035 by [name], RN Outcome: Adequate for Discharge 10/15/2023 1035 by [name], RN Outcome: Adequate for Discharge   Problem: Health Behavior/Discharge Planning: Goal: Ability to manage health-related needs will improve 10/15/2023 1035 by [name], RN Outcome: Adequate for Discharge 10/15/2023 1035 by [name], RN Outcome: Adequate for Discharge   Problem: Clinical Measurements: Goal: Ability to maintain clinical measurements within normal limits will improve 10/15/2023 1035 by [name], RN Outcome: Adequate for Discharge 10/15/2023 1035 by [name], RN Outcome: Adequate for Discharge Goal: Will remain free from infection 10/15/2023 1035 by [name], RN Outcome: Adequate for Discharge 10/15/2023 1035 by [name], RN Outcome: Adequate for Discharge Goal: Diagnostic test results will improve 10/15/2023 1035 by [name], RN Outcome: Adequate for Discharge 10/15/2023 1035 by [name], RN Outcome: Adequate for Discharge Goal: Respiratory complications will improve 10/15/2023 1035 by [name], RN Outcome: Adequate for Discharge 10/15/2023 1035 by [name], RN Outcome: Adequate for Discharge Goal: Cardiovascular complication will be avoided 10/15/2023 1035 by [name], RN Outcome: Adequate for Discharge 10/15/2023  1035 by [name], RN Outcome: Adequate for Discharge   Problem: Activity: Goal: Risk for activity intolerance will decrease 10/15/2023 1035 by [name], RN Outcome: Adequate for  Discharge 10/15/2023 1035 by [name], RN Outcome: Adequate for Discharge   Problem: Nutrition: Goal: Adequate nutrition will be maintained 10/15/2023 1035 by [name], RN Outcome: Adequate for Discharge 10/15/2023 1035 by [name], RN Outcome: Adequate for Discharge   Problem: Coping: Goal: Level of anxiety will decrease 10/15/2023 1035 by [name], RN Outcome: Adequate for Discharge 10/15/2023 1035 by [name], RN Outcome: Adequate for Discharge   Problem: Elimination: Goal: Will not experience complications related to bowel motility 10/15/2023 1035 by [name], RN Outcome: Adequate for Discharge 10/15/2023 1035 by [name], RN Outcome: Adequate for Discharge Goal: Will not experience complications related to urinary retention 10/15/2023 1035 by [name], RN Outcome: Adequate for Discharge 10/15/2023 1035 by [name], RN Outcome: Adequate for Discharge   Problem: Pain Managment: Goal: General experience of comfort will improve and/or be controlled 10/15/2023 1035 by [name], RN Outcome: Adequate for Discharge 10/15/2023 1035 by [name], RN Outcome: Adequate for Discharge   Problem: Safety: Goal: Ability to remain free from injury will improve 10/15/2023 1035 by [name], RN Outcome: Adequate for Discharge 10/15/2023 1035 by [name], RN Outcome: Adequate for Discharge   Problem: Skin Integrity: Goal: Risk for impaired skin integrity will decrease 10/15/2023 1035 by [name], RN Outcome: Adequate for Discharge 10/15/2023 1035 by [name], RN Outcome: Adequate for Discharge   Problem: Activity: Goal: Ability to tolerate increased activity will improve 10/15/2023 1035 by [name], RN Outcome: Adequate for Discharge 10/15/2023 1035 by [name], RN Outcome: Adequate for Discharge   Problem: Clinical Measurements: Goal: Ability to maintain a body temperature in the normal range will improve 10/15/2023 1035 by [name], RN Outcome: Adequate for Discharge 10/15/2023 1035 by [name], RN Outcome: Adequate for Discharge   Problem: Respiratory: Goal: Ability to maintain adequate ventilation will improve 10/15/2023 1035 by [name], RN Outcome: Adequate for Discharge 10/15/2023 1035 by [name], RN Outcome: Adequate for Discharge Goal: Ability to maintain a clear airway will improve 10/15/2023 1035 by [name], RN Outcome: Adequate for Discharge 10/15/2023 1035 by [name], RN Outcome: Adequate for Discharge

## 2023-10-15 NOTE — Discharge Summary (Signed)
 Physician Discharge Summary   Patient: [name] MRN: [id number] DOB: Aug 08, 1955  Admit date:     10/12/2023  Discharge date: 10/15/23  Discharge Physician: Drue ONEIDA Potter   PCP: Tisovec, Richard W, MD   Recommendations at discharge:  Follow-up with PCP  Discharge Diagnoses:   Community acquired pneumonia, failed outpatient treatment Hyponatremia Breast cancer s/p chemo, radiation, lumpectomy on oral anastrozole   Type 2 diabetes  Hypertension  Hypothyroidism  CHF  Hospital Course: [name] is a pleasant 68 y.o. female with medical history significant for breast cancer s/p lumpectomy, radiation therapy, chemo on oral treatment who presented to [facility] emergency room for shortness of breath and low oxygen saturations at home.  Patient stated that her oxygen went down to low 80s.  She was recently treated by her oncologist for pneumonia with possible erythromycin and amoxicillin.  She took those medication for 5 days but did not get better.  Upon arrival to the ED, patient is found to have hypoxemia around 85% on room air.  CT scan of the chest showed no PE but showed, but did show bilateral pneumonia and CT A/P showed small scattered liver hypodensities, no acute findings.  Patient was subsequently admitted for community-acquired pneumonia requiring IV antibiotic therapy.  Respiratory function improved and patient was weaned off oxygen.  Patient is stable and being discharged to complete oral antibiotics and to follow-up with PCP. Consultants: None Procedures performed: None Disposition: Home Diet recommendation:  Cardiac diet DISCHARGE MEDICATION: Allergies as of 10/15/2023   No Known Allergies      Medication List     STOP taking these medications    amoxicillin-clavulanate 875-125 MG tablet Commonly known as: AUGMENTIN       TAKE these medications    anastrozole  1 MG tablet Commonly known as: ARIMIDEX  Take 1 tablet (1 mg total) by mouth daily.   aspirin  EC  81 MG tablet Take 81 mg by mouth daily.   azithromycin  250 MG tablet Commonly known as: ZITHROMAX  Take 2 tablets (500 mg total) by mouth daily for 3 days.   Basaglar  KwikPen 100 UNIT/ML Inject 35 Units into the skin at bedtime.   cefUROXime  500 MG tablet Commonly known as: CEFTIN  Take 1 tablet (500 mg total) by mouth 2 (two) times daily with a meal for 3 days.   ferrous sulfate  325 (65 FE) MG tablet Take 325 mg by mouth daily.   insulin  lispro 100 UNIT/ML injection Commonly known as: HUMALOG Inject 10-15 Units into the skin 3 (three) times daily before meals. Sliding scale   JUICE PLUS FIBRE PO Take 3 tablets by mouth daily. Juice Plus Vegetable   JUICE PLUS FIBRE PO Take 3 tablets by mouth daily. Juice Plus Fruit   levothyroxine  112 MCG tablet Commonly known as: SYNTHROID  Take 112 mcg by mouth daily before breakfast.   metFORMIN  1000 MG tablet Commonly known as: GLUCOPHAGE  Take 1,000 mg by mouth 2 (two) times daily with a meal.   metoprolol  succinate 25 MG 24 hr tablet Commonly known as: Toprol  XL Take 1.5 tablets (37.5 mg total) by mouth daily.   nitrofurantoin 100 MG capsule Commonly known as: MACRODANTIN Take 100 mg by mouth daily.   ramipril  5 MG capsule Commonly known as: ALTACE  Take 5 mg by mouth daily with breakfast.   rosuvastatin  20 MG tablet Commonly known as: CRESTOR  Take 20 mg by mouth daily with supper.   triamcinolone  cream 0.1 % Commonly known as: KENALOG  Apply 1 Application topically daily as needed (  dermatitis).        Discharge Exam: Filed Weights   10/12/23 1100  Weight: 73.9 kg   Constitutional: Elderly female laying in bed on intranasal oxygen HEENT: Neck supple Respiratory: Clear to auscultation B/L, no wheezing, no rales.  Cardiovascular: Regular rate and rhythm, no murmurs / rubs / gallops. No extremity edema. 2+ pedal pulses. No carotid bruits.  Abdomen: Soft, no tenderness, Bowel sounds positive.  Musculoskeletal: no  clubbing / cyanosis. Good ROM, no contractures. Normal muscle tone.  Skin: no rashes, lesions, ulcers. Neurologic: CN 2-12 grossly intact. Sensation intact, No focal deficit identified Psychiatric: Alert and oriented x 3. Normal mood  Condition at discharge: good  The results of significant diagnostics from this hospitalization (including imaging, microbiology, ancillary and laboratory) are listed below for reference.   Imaging Studies: CT Angio Chest PE W/Cm &/Or Wo Cm Result Date: 10/12/2023 CLINICAL DATA:  Pulmonary embolism (PE) suspected, high prob EXAM: CT ANGIOGRAPHY CHEST WITH CONTRAST TECHNIQUE: Multidetector CT imaging of the chest was performed using the standard protocol during bolus administration of intravenous contrast. Multiplanar CT image reconstructions and MIPs were obtained to evaluate the vascular anatomy. RADIATION DOSE REDUCTION: This exam was performed according to the departmental dose-optimization program which includes automated exposure control, adjustment of the mA and/or kV according to patient size and/or use of iterative reconstruction technique. CONTRAST:  OMNIPAQUE  IOHEXOL  350 MG/ML SOLN COMPARISON:  March 05, 2019 FINDINGS: Pulmonary Embolism: No pulmonary embolism. Cardiovascular: Mild cardiomegaly. No pericardial effusion. Dense calcified atherosclerosis throughout the LAD.Fusiform dilation of the ascending aorta measuring 4 cm. Scattered calcified arch and descending aortic atherosclerosis. Mediastinum/Nodes: No mediastinal mass. Mildly enlarged right infrahilar lymph nodes measuring up to 1.2 cm. Scattered subcentimeter multistation mediastinal lymph nodes. Enlarged subcarinal lymph node measuring 1.2 cm. Right axillary lymph node dissection. Lungs/Pleura: The midline trachea and bronchi are patent. Nodular consolidation throughout both upper lobes. More dense airspace consolidation in the right middle and both lower lobes, more so on the right than the left.  No pleural effusion or pneumothorax. Musculoskeletal: No acute fracture or destructive bone lesion. Osteopenia. Upper Abdomen: For findings below the diaphragm, please see the separately dictated CT of the abdomen and pelvis report, which was performed concurrently. Review of the MIP images confirms the above findings. IMPRESSION: 1. No pulmonary embolism. 2. Nodular airspace opacities with more pronounced consolidation in both lower lobes, worrisome for a multifocal pneumonia. No parapneumonic effusion. 3. Mildly enlarged right infrahilar and subcarinal lymph nodes, likely reactive. Follow-up per routine oncologic protocols recommended. 4. For findings below the diaphragm, please see the separately dictated CT of the abdomen and pelvis report, which was performed concurrently. Aortic Atherosclerosis (ICD10-I70.0). Electronically Signed   By: Rogelia Myers M.D.   On: 10/12/2023 14:53   CT ABDOMEN PELVIS W CONTRAST Result Date: 10/12/2023 CLINICAL DATA:  Nausea, vomiting EXAM: CT ABDOMEN AND PELVIS WITH CONTRAST TECHNIQUE: Multidetector CT imaging of the abdomen and pelvis was performed using the standard protocol following bolus administration of intravenous contrast. RADIATION DOSE REDUCTION: This exam was performed according to the departmental dose-optimization program which includes automated exposure control, adjustment of the mA and/or kV according to patient size and/or use of iterative reconstruction technique. CONTRAST:  OMNIPAQUE  IOHEXOL  350 MG/ML SOLN COMPARISON:  CT of the abdomen pelvis performed October 13, 2022 FINDINGS: Lower chest: Multifocal airspace consolidation is present which is worst in the right lower lobe. The right lower lobe is densely consolidated. Hepatobiliary: A hypodensity is present in the peripheral  right liver which is estimated at 1.1 cm in similar when compared to CT performed October 13, 2022. Postsurgical changes from cholecystectomy. Pancreas: Unremarkable. No pancreatic  ductal dilatation or surrounding inflammatory changes. Spleen: Normal in size without focal abnormality. Adrenals/Urinary Tract: Adrenal glands are grossly unremarkable. There is no overt hydronephrosis. A peripheral 2 mm calculus is present in the right kidney. There is mild asymmetric prominence of the renal collecting system which is similar when compared to the previous exam. Urinary bladder is grossly unremarkable. Stomach/Bowel: No dilated loops of bowel are seen. The appendix is well seen and within normal limits. A moderate volume of stool material is present in the colon. Vascular/Lymphatic: Mild atherosclerotic changes. Reproductive: Uterus and bilateral adnexa are unremarkable. Other: Diastasis of the ventral abdominal wall. Musculoskeletal: No acute or significant osseous findings. IMPRESSION: 1. Multifocal pneumonia, worst in the right lower lobe. 2. Right-sided nephrolithiasis. Electronically Signed   By: Maude Naegeli M.D.   On: 10/12/2023 14:50   DG Chest 2 View Result Date: 10/12/2023 CLINICAL DATA:  Shortness of breath, hypoxia, prior history of breast cancer EXAM: CHEST - 2 VIEW COMPARISON:  01/12/2023 FINDINGS: Moderately severe patchy bilateral airspace opacities, worse throughout the right lung but also present in the left lower lobe compatible with asymmetric bilateral pneumonia. Right cardiac border is obscured. Overall normal heart size. No superimposed edema or CHF. No large effusion or pneumothorax. Trachea midline. Postop clips in the right axilla. IMPRESSION: Moderately severe bilateral airspace disease, worse on the right, compatible with asymmetric bilateral pneumonia. Electronically Signed   By: CHRISTELLA.  Shick M.D.   On: 10/12/2023 14:19   ECHOCARDIOGRAM LIMITED Result Date: 10/05/2023    ECHOCARDIOGRAM LIMITED REPORT   Patient Name:   NARIYA NEUMEYER Date of Exam: 10/05/2023 Medical Rec #:  [id number]       Height:       66.0 in Accession #:    [id number]      Weight:       173.0 lb Date  of Birth:  1956-02-23       BSA:          1.880 m Patient Age:    68 years        BP:           138/82 mmHg Patient Gender: F               HR:           97 bpm. Exam Location:  [facility] Procedure: 2D Echo, Limited Echo, Intracardiac Opacification Agent, Limited            Color Doppler and Cardiac Doppler (Both Spectral and Color Flow            Doppler were utilized during procedure). 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 CAD,  Signs/Symptoms:Chest Pain; Risk Factors:Non-Smoker,                 Hypertension, Diabetes and Dyslipidemia.  Sonographer:    Doyal Point MHA, BS, RDCS Referring Phys: [phone] SHERI HAMMOCK IMPRESSIONS  1. Left ventricular ejection fraction, by estimation, is 40 to 45%. Left ventricular ejection fraction by PLAX is 40 %. The left ventricle has mildly decreased function. The left ventricle demonstrates global hypokinesis. The left ventricular internal cavity size was mildly dilated. Left ventricular diastolic parameters are consistent with Grade I diastolic dysfunction (impaired relaxation).  2. Right ventricular systolic function is normal. The right ventricular size is normal. Tricuspid regurgitation signal is inadequate for assessing PA pressure.  3. The mitral valve is normal in structure. Mild mitral valve regurgitation. No evidence of mitral stenosis.  4. The aortic valve is normal in structure. Aortic valve regurgitation is not visualized. Aortic valve sclerosis is present, with no evidence of aortic valve stenosis.  5. The inferior vena cava is normal in size with greater than 50% respiratory variability, suggesting right atrial pressure of 3 mmHg. FINDINGS  Left Ventricle:  Left ventricular ejection fraction, by estimation, is 40 to 45%. Left ventricular ejection fraction by PLAX is 40 %. The left ventricle has mildly decreased function. The left ventricle demonstrates global hypokinesis. Definity  contrast agent was given IV to delineate the left ventricular endocardial borders. The left ventricular internal cavity size was mildly dilated. There is no left ventricular hypertrophy. Left ventricular diastolic parameters are consistent with Grade I diastolic dysfunction (impaired relaxation). Right Ventricle: The right ventricular size is normal. No increase in right ventricular wall thickness. Right ventricular systolic function is normal. Tricuspid regurgitation signal is inadequate for assessing PA pressure. Left Atrium: Left atrial size was normal in size. Right Atrium: Right atrial size was normal in size. Pericardium: There is no evidence of pericardial effusion. Mitral Valve: The mitral valve is normal in structure. Mild mitral valve regurgitation. No evidence of mitral valve stenosis. Tricuspid Valve: The tricuspid valve is normal in structure. Tricuspid valve regurgitation is not demonstrated. No evidence of tricuspid stenosis. Aortic Valve: The aortic valve is normal in structure. Aortic valve regurgitation is not visualized. Aortic valve sclerosis is present, with no evidence of aortic valve stenosis. Aortic valve mean gradient measures 5.0 mmHg. Aortic valve peak gradient measures 9.9 mmHg. Pulmonic Valve: The pulmonic valve was normal in structure. Pulmonic valve regurgitation is not visualized. No evidence of pulmonic stenosis. Aorta: The aortic root is normal in size and structure. Venous: The inferior vena cava is normal in size with greater than 50% respiratory variability, suggesting right atrial pressure of 3 mmHg. IAS/Shunts: No atrial level shunt detected by color flow Doppler. LEFT VENTRICLE PLAX 2D LV EF:         Left            Diastology                ventricular      LV e' medial:    6.85 cm/s                ejection        LV E/e' medial:  12.2                fraction by     LV e' lateral:   11.30 cm/s                PLAX is 40      LV E/e'  lateral: 7.4                %. LVIDd:         5.72 cm LVIDs:         4.60 cm LV PW:         0.90 cm LV IVS:        0.88 cm  AORTIC VALVE AV Vmax:           157.00 cm/s AV Vmean:          105.000 cm/s AV VTI:            0.260 m AV Peak Grad:      9.9 mmHg AV Mean Grad:      5.0 mmHg LVOT Vmax:         81.20 cm/s LVOT Vmean:        54.500 cm/s LVOT VTI:          0.153 m LVOT/AV VTI ratio: 0.59 MITRAL VALVE MV Area (PHT): 4.96 cm     SHUNTS MV Decel Time: 153 msec     Systemic VTI: 0.15 m MV E velocity: 83.35 cm/s MV A velocity: 124.00 cm/s MV E/A ratio:  0.67 Evalene Lunger MD Electronically signed by Evalene Lunger MD Signature Date/Time: 10/05/2023/6:34:25 PM    Final     Microbiology: Results for orders placed or performed during the hospital encounter of 10/12/23  Resp panel by RT-PCR (RSV, Flu A&B, Covid) Anterior Nasal Swab     Status: None   Collection Time: 10/12/23 11:51 AM   Specimen: Anterior Nasal Swab  Result Value Ref Range Status   SARS Coronavirus 2 by RT PCR NEGATIVE NEGATIVE Final    Comment: (NOTE) SARS-CoV-2 target nucleic acids are NOT DETECTED.  The SARS-CoV-2 RNA is generally detectable in upper respiratory specimens during the acute phase of infection. The lowest concentration of SARS-CoV-2 viral copies this assay can detect is 138 copies/mL. A negative result does not preclude SARS-Cov-2 infection and should not be used as the sole basis for treatment or other patient management decisions. A negative result may occur with  improper specimen collection/handling, submission of specimen other than nasopharyngeal swab, presence of viral mutation(s) within the areas targeted by this assay, and inadequate number of viral copies(<138 copies/mL). A negative result must be combined with clinical  observations, patient history, and epidemiological information. The expected result is Negative.  Fact Sheet for Patients:  BloggerCourse.com  Fact Sheet for Healthcare Providers:  SeriousBroker.it  This test is no t yet approved or cleared by the [place]  FDA and  has been authorized for detection and/or diagnosis of SARS-CoV-2 by FDA under an Emergency Use Authorization (EUA). This EUA will remain  in effect (meaning this test can be used) for the duration of the COVID-19 declaration under Section 564(b)(1) of the Act, 21 U.S.C.section 360bbb-3(b)(1), unless the authorization is terminated  or revoked sooner.       Influenza A by PCR NEGATIVE NEGATIVE Final   Influenza B by PCR NEGATIVE NEGATIVE Final    Comment: (NOTE) The Xpert Xpress SARS-CoV-2/FLU/RSV plus assay is intended as an aid in the diagnosis of influenza from Nasopharyngeal swab specimens and should not be used as a sole basis for treatment. Nasal washings and aspirates are unacceptable for Xpert Xpress SARS-CoV-2/FLU/RSV testing.  Fact Sheet for Patients: BloggerCourse.com  Fact Sheet for Healthcare Providers: SeriousBroker.it  This test is not yet approved or cleared by the [place]  FDA and has been authorized for  detection and/or diagnosis of SARS-CoV-2 by FDA under an Emergency Use Authorization (EUA). This EUA will remain in effect (meaning this test can be used) for the duration of the COVID-19 declaration under Section 564(b)(1) of the Act, 21 U.S.C. section 360bbb-3(b)(1), unless the authorization is terminated or revoked.     Resp Syncytial Virus by PCR NEGATIVE NEGATIVE Final    Comment: (NOTE) Fact Sheet for Patients: BloggerCourse.com  Fact Sheet for Healthcare Providers: SeriousBroker.it  This test is not yet approved or cleared by  the [place]  FDA and has been authorized for detection and/or diagnosis of SARS-CoV-2 by FDA under an Emergency Use Authorization (EUA). This EUA will remain in effect (meaning this test can be used) for the duration of the COVID-19 declaration under Section 564(b)(1) of the Act, 21 U.S.C. section 360bbb-3(b)(1), unless the authorization is terminated or revoked.  Performed at [facility], [address] [place], [place] [address]     Labs: CBC: Recent Labs  Lab 10/11/23 1251 10/12/23 1103 10/13/23 0457 10/14/23 0502 10/15/23 0432  WBC 11.6* 13.1* 9.4 9.0 9.9  NEUTROABS 9.0*  --   --  6.1 6.6  HGB 10.4* 10.5* 9.5* 10.0* 9.9*  HCT 31.3* 32.6* 29.2* 31.3* 29.4*  MCV 78.1* 79.7* 78.1* 79.0* 76.8*  PLT 654* 754* 661* 645* 663*   Basic Metabolic Panel: Recent Labs  Lab 10/11/23 1251 10/12/23 1103 10/13/23 0457 10/14/23 0502 10/15/23 0432  NA 129* 128* 130* 129* 131*  K 4.5 4.0 3.6 3.8 3.4*  CL 94* 92* 94* 93* 93*  CO2 28 24 26 26 27   GLUCOSE 258* 268* 217* 237* 259*  BUN 7* 7* 7* 7* 7*  CREATININE 0.51 0.57 0.59 0.52 0.66  CALCIUM  8.8* 9.1 8.3* 8.2* 8.4*   Liver Function Tests: Recent Labs  Lab 10/11/23 1251 10/12/23 1103 10/13/23 0457  AST 12* 17 13*  ALT 12 16 15   ALKPHOS 96 101 87  BILITOT 0.6 0.9 0.7  PROT 6.9 7.2 6.6  ALBUMIN 3.0* 2.5* 2.1*   CBG: Recent Labs  Lab 10/14/23 0732 10/14/23 1141 10/14/23 1643 10/14/23 2056 10/15/23 0801  GLUCAP 247* 323* 241* 314* 262*    Discharge time spent:  36 minutes.  Signed: Drue ONEIDA Potter, MD [facility] 10/15/2023

## 2023-10-17 ENCOUNTER — Ambulatory Visit: Payer: Self-pay

## 2023-10-17 ENCOUNTER — Other Ambulatory Visit: Payer: Self-pay

## 2023-10-17 NOTE — Telephone Encounter (Signed)
 FYI Only or Action Required?: Action required by provider: request for appointment.  Patient is to be new patient with Pulmonology for SOB and low oxygen levels with exertion.  Called Nurse Triage reporting Shortness of Breath, low oxygen levels, Hospitalization Follow-up, and Cough.  Symptoms began several weeks ago.  Interventions attempted: Prescription medications: oral and IV antibx, Increased fluids/rest, and Other: hospital admission.  Symptoms are: persisting.  Triage Disposition: Go to ED Now (Notify PCP)  Patient/caregiver understands and will follow disposition?: Unsure      Copied from CRM [id number]. Topic: Clinical - Red Word Triage >> Oct 17, 2023  8:59 AM [place] KIDD wrote: [facility] that prompted transfer to Nurse Triage: patient would be a new patient patient husband is calling cause he see dr tamea and wants his wife to be seen but nothing was available  patient oxygen levels are going down every time she get up and walk . Patient just got out hospital . Reason for Disposition  Oxygen level (e.g., pulse oximetry) 90% or lower  Answer Assessment - Initial Assessment Questions 2. ONSET: When did this breathing problem begin?      About a month ago 3. PATTERN Does the difficult breathing come and go, or has it been constant since it started?      More when she gets active, that's when oxygen level will drop also 6. CARDIAC HISTORY: Do you have any history of heart disease? (e.g., heart attack, angina, bypass surgery, angioplasty)      Chest pain confirms not worse than her usual 7. LUNG HISTORY: Do you have any history of lung disease?  (e.g., pulmonary embolus, asthma, emphysema)     4 weeks ago pneumonia in both lungs, 10 days antibx, finished it didn't seem to be getting any better Wednesday morning got up, feeling real bad, oxygen level 85%, take to ED, went there, admitted her there, was on IV antibx 3 days, released her Saturday, surprised they didn't  recommend doc for her to see but husband been seeing Dr. tamea, trying to get her in to see her 9. OTHER SYMPTOMS: Do you have any other symptoms? (e.g., chest pain, cough, dizziness, fever, runny nose)     Cough for the last 4 weeks, no one's prescribed cough med Just gave more of same antibx at discharge No dizziness or fever No inhalers or rescue measures for breathing 2L oxygen for 24 hours in hospital but then off 10. O2 SATURATION MONITOR:  Do you use an oxygen saturation monitor (pulse oximeter) at home? If Yes, ask: What is your reading (oxygen level) today? What is your usual oxygen saturation reading? (e.g., 95%)       Got up this morning, came in here, breakfast, down to 87% again When sits down for while goes back up to 95-96%  Lost 20 lbs in 4 weeks  Protocols used: Breathing Difficulty-A-AH

## 2023-10-17 NOTE — Telephone Encounter (Signed)
 Spoke with husband (DPR) and scheduled her for 11/03/23. Nothing further needed at this time.

## 2023-10-18 ENCOUNTER — Ambulatory Visit
Admission: RE | Admit: 2023-10-18 | Discharge: 2023-10-18 | Disposition: A | Discharge status: Home / Self Care | Source: Ambulatory Visit | Attending: Hematology

## 2023-10-18 ENCOUNTER — Ambulatory Visit
Admission: RE | Admit: 2023-10-18 | Discharge: 2023-10-18 | Disposition: A | Discharge status: Home / Self Care | Source: Ambulatory Visit | Attending: Hematology | Admitting: Hematology

## 2023-10-18 ENCOUNTER — Other Ambulatory Visit: Payer: Self-pay | Admitting: Hematology

## 2023-10-18 DIAGNOSIS — R928 Other abnormal and inconclusive findings on diagnostic imaging of breast: Secondary | ICD-10-CM

## 2023-10-18 DIAGNOSIS — C50411 Malignant neoplasm of upper-outer quadrant of right female breast: Secondary | ICD-10-CM

## 2023-10-18 NOTE — Telephone Encounter (Signed)
 Please let [name] know that the dosing guidelines for trastuzumab  recommend discontinuing the medication for 4 weeks if her left ventricular ejection fraction drops by more than 15 percentage points and repeating an echocardiogram to see if there has been any improvement.  It looks like her initial pretreatment echocardiogram showed an ejection fraction of 50-55%, now down to 40-45%.  Therefore, her LVEF reduction is just below the typical threshold, though I think it would be worthwhile for her to discuss the risks and benefits of holding the treatment for 4 weeks with her oncologist.  I will also forward her message to Dr. Darron for his review when he returns to the office next week.  Lonni Hanson, MD [facility]

## 2023-10-19 ENCOUNTER — Ambulatory Visit

## 2023-10-20 ENCOUNTER — Encounter: Payer: Self-pay | Admitting: *Deleted

## 2023-10-25 ENCOUNTER — Ambulatory Visit (HOSPITAL_COMMUNITY)
Admission: RE | Admit: 2023-10-25 | Discharge: 2023-10-25 | Disposition: A | Discharge status: Home / Self Care | Source: Ambulatory Visit | Attending: Hematology

## 2023-10-25 ENCOUNTER — Other Ambulatory Visit: Payer: Self-pay

## 2023-10-25 ENCOUNTER — Encounter: Payer: Self-pay | Admitting: Hematology

## 2023-10-25 ENCOUNTER — Inpatient Hospital Stay (HOSPITAL_BASED_OUTPATIENT_CLINIC_OR_DEPARTMENT_OTHER): Admitting: Hematology

## 2023-10-25 VITALS — BP 124/62 | HR 103 | Temp 98.7°F | Resp 16 | Wt 164.7 lb

## 2023-10-25 DIAGNOSIS — C50411 Malignant neoplasm of upper-outer quadrant of right female breast: Secondary | ICD-10-CM

## 2023-10-25 DIAGNOSIS — Z170 Estrogen receptor positive status [ER+]: Secondary | ICD-10-CM | POA: Insufficient documentation

## 2023-10-25 MED ORDER — PREDNISONE 20 MG PO TABS: 60.0000 mg | ORAL_TABLET | Freq: Every day | ORAL | 0 refills | Status: DC

## 2023-10-25 NOTE — Assessment & Plan Note (Signed)
 [id number], stage IA, G2, HER2 positive, ER positive, PR negative, HER2+, and DCIS(+) It was discovered on screening mammogram, initial biopsy showed DCIS only.   -Status post a right lumpectomy.  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 Margins are negative for invasive cancer, but DCIS margin was close. No lymphovascular or perineural invasion. Lymph node biopsy was not performed, Dr. Ebbie has ordered an ultrasound of the right axilla for further evaluation.  If ultrasound is negative, I think it is okay not to have sentinel lymph node biopsy given the small primary tumor. -Given the aggressive nature of her HER2 positive disease, and moderate risk of recurrence, I do recommend adjuvant chemotherapy with Taxol  or Abraxane  once weekly for 12 weeks. -she underwent port placement and right axillary SLN biopsy on 01/12/23 and all 3 nodes were negative   -She started to weekly paclitaxel  and trastuzumab  on February 02, 2023, completed Taxol  on 1/213/2025, plan for adjuvant trastuzumab  for 1 year -she started adjuvant anastrozole  in late April 2025 - Trastuzumab  injection has been held since early June 2025, due to multiple UTI, bronchitis, and hospital admission for multilobar pneumonia in July 2025

## 2023-10-25 NOTE — Progress Notes (Signed)
 [facility]   [phone] [phone]   Clinic Follow up Note   Patient Care Team: Tisovec, [name], MD as PCP - General (Internal Medicine) [name], MD as PCP - Cardiology (Cardiology) [name], MD as Consulting Physician (Hematology) [name], MD as Consulting Physician (General Surgery) [name], MD as Consulting Physician (Radiation Oncology) [name], RN (Inactive) as Oncology Nurse Navigator [name], RN as Oncology Nurse Navigator [name], MD as Consulting Physician (Radiation Oncology)  Date of Service:  10/25/2023  CHIEF COMPLAINT: f/u of breast cancer   CURRENT THERAPY:  Anastrozole   Trastuzumab  on hold for now   Oncology History   Malignant neoplasm of upper-outer quadrant of right breast in female, estrogen receptor positive (HCC) pT1aN0M0, stage IA, G2, HER2 positive, ER positive, PR negative, HER2+, and DCIS(+) It was discovered on screening mammogram, initial biopsy showed DCIS only.   -Status post a right lumpectomy.  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 Margins are negative for invasive cancer, but DCIS margin was close. No lymphovascular or perineural invasion. Lymph node biopsy was not performed, Dr. Ebbie has ordered an ultrasound of the right axilla for further evaluation.  If ultrasound is negative, I think it is okay not to have sentinel lymph node biopsy given the small primary tumor. -Given the aggressive nature of her HER2 positive disease, and moderate risk of recurrence, I do recommend adjuvant chemotherapy with Taxol  or Abraxane  once weekly for 12 weeks. -she underwent port placement and right axillary SLN biopsy on 01/12/23 and all 3 nodes were negative   -She started to weekly paclitaxel  and trastuzumab  on February 02, 2023, completed Taxol  on 1/213/2025, plan for  adjuvant trastuzumab  for 1 year -she started adjuvant anastrozole  in late April 2025 - Trastuzumab  injection has been held since early June 2025, due to multiple UTI, bronchitis, and hospital admission for multilobar pneumonia in July 2025  Assessment & Plan Breast cancer, status post chemotherapy and trastuzumab  Concern for possible trastuzumab -induced pneumonitis contributing to respiratory symptoms. - Schedule circulating tumor DNA test (Guardian review or [employer]) for Friday or next week. - Coordinate with oncological cardiologists to assess for chemo-related heart disease.  Persistent pneumonia and possible trastuzumab -induced pneumonitis Recently hospitalized for double pneumonia, continues to experience cough, shortness of breath, and intermittent hypoxemia. Concern for possible trastuzumab -induced pneumonitis. - Order chest x-ray today to assess pneumonia resolution. - Initiate prednisone  tapering dose starting with 60 mg daily for one week, then taper as discussed. - Refer to pulmonologist for further evaluation, including potential bronchoscopy. - Monitor oxygen levels at home and report if consistently below 90.  Intermittent hypoxemia Experiences intermittent hypoxemia, with oxygen levels dropping below 90 at times, particularly in the morning. May be related to recent pneumonia or possible drug-induced pneumonitis. - Monitor oxygen levels at home and report if consistently below 90. - Refer to pulmonologist for further evaluation.  Heart failure with reduced ejection fraction (EF 40%) Ejection fraction decreased to 40%. Currently on metoprolol , recently increased. Need to assess whether heart failure is related to cancer treatment. - Coordinate with oncological cardiologists to assess for chemo-related heart disease. - Continue metoprolol  at current dose of 37.5 mg.  Type 2 diabetes mellitus, insulin -dependent Long-standing insulin -dependent type 2 diabetes mellitus.  Steroid therapy for possible pneumonitis may affect blood sugar levels. - Monitor blood sugar levels daily, preferably twice a day, especially during  steroid therapy. - Adjust insulin  dosage as needed based on blood sugar readings.  Plan -her recent CT scan reviewed, concerning for pneumonitis. She clinically has not improved  with antibiotics, so I will start her on prednisone , 60mg  daily for one week then 40mg  daily for 2 weeks -f/u in 2 weeks -will reach out her cardiology and pulmonology  -continue to hold Trastuzumab  for now -will    SUMMARY OF ONCOLOGIC HISTORY: Oncology History  Malignant neoplasm of upper-outer quadrant of right breast in female, estrogen receptor positive (HCC)  10/27/2022 Cancer Staging   Staging form: Breast, AJCC 8th Edition - Clinical stage from 10/27/2022: Stage 0 (cTis (DCIS), cN0, cM0, G3, ER+, PR-, HER2: Not Assessed) - Signed by [name], MD on 11/03/2022 Stage prefix: Initial diagnosis Histologic grading system: 3 grade system   11/01/2022 Initial Diagnosis   Ductal carcinoma in situ (DCIS) of right breast   11/09/2022 Genetic Testing   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  No other deleterious variants in [facility] Common Hereditary Cancers +RNA Panel. Report date is 11/09/2022.    The [employer] Common Hereditary Cancers + RNA Panel includes sequencing, deletion/duplication, and RNA analysis of the following 48 genes: APC, ATM, AXIN2, BAP1, BARD1, BMPR1A, BRCA1, BRCA2, BRIP1, CDH1, CDK4*, CDKN2A*, CHEK2, CTNNA1, DICER1, EPCAM* (del/dup only), FH, GREM1* (promoter dup analysis only), HOXB13*, KIT*, MBD4*, MEN1, MLH1, MSH2, MSH3, MSH6, MUTYH, NF1, NTHL1, PALB2, PDGFRA*, PMS2, POLD1, POLE, PTEN, RAD51C, RAD51D, SDHA (sequencing only), SDHB, SDHC, SDHD, SMAD4, SMARCA4, STK11, TP53, TSC1, TSC2, VHL.  *Genes without RNA analysis.    12/07/2022 Cancer Staging   Staging form: Breast, AJCC 8th Edition - Pathologic stage from 12/07/2022:  Stage IA (pT1a, pN0, cM0, G2, ER+, PR-, HER2+) - Signed by [name], MD on 12/20/2022 Stage prefix: Initial diagnosis Histologic grading system: 3 grade system Residual tumor (R): R0 - None   02/02/2023 -  Chemotherapy   Patient is on Treatment Plan : BREAST Abraxane  + Trastuzumab  q7d / Trastuzumab  q21d        Discussed the use of AI scribe software for clinical note transcription with the patient, who gave verbal consent to proceed.  History of Present Illness [name] is a 68 year old female with breast cancer who presents for follow-up after hospitalization for double pneumonia. Referred by her husband's pulmonologist due to recent respiratory issues.  She was hospitalized for four days with double pneumonia and continues to experience a persistent cough and shortness of breath. Oxygen saturation fluctuates, with levels as low as 88 in the morning, improving with activity, but dropping upon exertion. An appointment with a pulmonologist is scheduled for August 7th.  An echocardiogram on July 9th showed a reduced ejection fraction of 40%. She is currently on metoprolol , increased to 37.5 mg. Cancer treatment shots have been paused for two weeks due to her illness, and she is in communication with her cardiologist regarding the safety of resuming treatment.  A CT scan during her hospital stay noted a 1.1 cm hepatic lesion, unchanged from a previous scan in 2024, and previously deemed not concerning.  Diabetes is managed with insulin  and oral medication. Blood sugar levels were poorly controlled during hospitalization but are better managed at home, with a recent reading of 169 mg/dL in the morning.     All other systems were reviewed with the patient and are negative.  MEDICAL HISTORY:  Past Medical History:  Diagnosis Date   Anemia    Arthritis    Atypical  chest pain    a. 05/2018 CTA chest: No PE; b. 05/2018 MV: No ischemia/scar. EF 65%.   Breast cancer (HCC)    CHEK2 gene  mutation positive 11/12/2022   C.1100del (p.Thr367Metfs*15)     Coronary artery calcification seen on CT scan    a. 05/2018 CT Chest: Coronary and Ao atherosclerotic Calcifications.   Diabetes mellitus without complication (HCC)    Diastolic dysfunction    a. 05/2018 Echo: EF 60-65%, impaired relaxation. Nl RVSP.   Dysrhythmia    PVC's   Family history of adverse reaction to anesthesia    sister-malignant hyperthermia   FH of Malignant hyperthermia    a. sister had malginant hyperthermia   History of kidney stones    History of renal insufficiency    History of skin cancer    Hyperlipemia    Osteoporosis    Pulmonary nodules    a. 05/2018 CT Chest: Small pulm nodules measuring up to 6mm avg diameter. Rec non-contrast Chest CT in 6-12 mos.   Thyroid  nodule     SURGICAL HISTORY: Past Surgical History:  Procedure Laterality Date   AXILLARY LYMPH NODE BIOPSY Right 01/12/2023   Procedure: RIGHT AXILLARY SENTINEL NODE BIOPSY;  Surgeon: [name], MD;  Location: [facility] OR;  Service: General;  Laterality: Right;  LMA   BREAST BIOPSY Right 10/27/2022   MM RT BREAST BX W LOC DEV 1ST LESION IMAGE BX SPEC STEREO GUIDE 10/27/2022 [facility]   BREAST BIOPSY  12/06/2022   MM RT RADIOACTIVE SEED LOC MAMMO GUIDE 12/06/2022 [facility]   BREAST LUMPECTOMY WITH RADIOACTIVE SEED LOCALIZATION Right 12/07/2022   Procedure: RIGHT BREAST LUMPECTOMY WITH RADIOACTIVE SEED LOCALIZATION;  Surgeon: [name], MD;  Location: [facility] OR;  Service: General;  Laterality: Right;   CESAREAN SECTION     CHOLECYSTECTOMY  2006   lap choli with umb hernia   COLONOSCOPY     HYSTEROSCOPY WITH D & C N/A 12/07/2022   Procedure: DILATATION AND CURETTAGE /HYSTEROSCOPY WITH POLYPECTOMY ENDOMETRIAL BIOPSY;  Surgeon: Okey Leader, MD;  Location: [facility] OR;  Service: Gynecology;  Laterality: N/A;   LIPOMA EXCISION Left 03/15/2014   Procedure: EXCISION LIPOMA LEFT LOWER QUARDRANT ABDOMINAL WALL;  Surgeon: Dann Hummer, MD;  Location: [facility];  Service: General;  Laterality: Left;   PORTACATH PLACEMENT N/A 01/12/2023   Procedure: PORT PLACEMENT WITH ULTRASOUND GUIDANCE;  Surgeon: [name], MD;  Location: [facility] OR;  Service: General;  Laterality: N/A;   THYROIDECTOMY N/A 06/25/2019   Procedure: TOTAL THYROIDECTOMY;  Surgeon: Eletha Boas, MD;  Location: [facility];  Service: General;  Laterality: N/A;   TONSILLECTOMY     TRIGGER FINGER RELEASE  2012   lt small,middle,long   TRIGGER FINGER RELEASE Right 07/14/2017   Procedure: RELEASE TRIGGER FINGER/A-1 PULLEY RIGHT THUMB;  Surgeon: Murrell Kuba, MD;  Location: [facility];  Service: Orthopedics;  Laterality: Right;   UMBILICAL HERNIA REPAIR  2006   with lap choli    I have reviewed the social history and family history with the patient and they are unchanged from previous note.  ALLERGIES:  has no known allergies.  MEDICATIONS:  Current Outpatient Medications  Medication Sig Dispense Refill   predniSONE  (DELTASONE ) 20 MG tablet Take 3 tablets (60 mg total) by mouth daily with breakfast. For 1 weeks then decrease to 2 tabs daily for 2 weeks. Further tapering per MD 50 tablet 0   anastrozole  (ARIMIDEX ) 1 MG tablet TAKE 1 TABLET BY MOUTH EVERY DAY  90 tablet 1   aspirin  EC 81 MG tablet Take 81 mg by mouth daily.     ferrous sulfate  325 (65 FE) MG tablet Take 325 mg by mouth daily.     Insulin  Glargine (BASAGLAR  KWIKPEN) 100 UNIT/ML SOPN Inject 35 Units into the skin at bedtime.     insulin  lispro (HUMALOG) 100 UNIT/ML injection Inject 10-15 Units into the skin 3 (three) times daily before meals. Sliding scale     levothyroxine  (SYNTHROID ) 112 MCG tablet Take 112 mcg by mouth daily before breakfast.     metFORMIN  (GLUCOPHAGE ) 1000 MG tablet Take 1,000 mg by mouth 2 (two) times daily with a meal.     metoprolol  succinate (TOPROL  XL) 25 MG 24 hr tablet Take 1.5 tablets (37.5 mg total) by mouth daily. 135 tablet 3    nitrofurantoin (MACRODANTIN) 100 MG capsule Take 100 mg by mouth daily.     Nutritional Supplements (JUICE PLUS FIBRE PO) Take 3 tablets by mouth daily. Juice Plus Vegetable     Nutritional Supplements (JUICE PLUS FIBRE PO) Take 3 tablets by mouth daily. Juice Plus Fruit     ramipril  (ALTACE ) 5 MG capsule Take 5 mg by mouth daily with breakfast.     rosuvastatin  (CRESTOR ) 20 MG tablet Take 20 mg by mouth daily with supper.      triamcinolone  cream (KENALOG ) 0.1 % Apply 1 Application topically daily as needed (dermatitis).     No current facility-administered medications for this visit.    PHYSICAL EXAMINATION: ECOG PERFORMANCE STATUS: 1 - Symptomatic but completely ambulatory  Vitals:   10/25/23 1331  BP: 124/62  Pulse: (!) 103  Resp: 16  Temp: 98.7 F (37.1 C)  SpO2: 94%   Wt Readings from Last 3 Encounters:  10/25/23 164 lb 11.2 oz (74.7 kg)  10/12/23 163 lb (73.9 kg)  10/11/23 163 lb 4.8 oz (74.1 kg)     GENERAL:alert, no distress and comfortable SKIN: skin color, texture, turgor are normal, no rashes or significant lesions EYES: normal, Conjunctiva are pink and non-injected, sclera clear NECK: supple, thyroid  normal size, non-tender, without nodularity LYMPH:  no palpable lymphadenopathy in the cervical, axillary  LUNGS: clear to auscultation and percussion with normal breathing effort. (+) crackles in the left lung. Oxygen desaturation on exertion. HEART: regular rate & rhythm and no murmurs and no lower extremity edema ABDOMEN:abdomen soft, non-tender and normal bowel sounds Musculoskeletal:no cyanosis of digits and no clubbing  NEURO: alert & oriented x 3 with fluent speech, no focal motor/sensory deficits  Physical Exam   LABORATORY DATA:  I have reviewed the data as listed    Latest Ref Rng & Units 10/15/2023    4:32 AM 10/14/2023    5:02 AM 10/13/2023    4:57 AM  CBC  WBC 4.0 - 10.5 K/uL 9.9  9.0  9.4   Hemoglobin 12.0 - 15.0 g/dL 9.9  89.9  9.5   Hematocrit  36.0 - 46.0 % 29.4  31.3  29.2   Platelets 150 - 400 K/uL 663  645  661         Latest Ref Rng & Units 10/15/2023    4:32 AM 10/14/2023    5:02 AM 10/13/2023    4:57 AM  CMP  Glucose 70 - 99 mg/dL 740  762  782   BUN 8 - 23 mg/dL 7  7  7    Creatinine 0.44 - 1.00 mg/dL 9.33  9.47  9.40   Sodium 135 - 145 mmol/L 131  129  130   Potassium 3.5 - 5.1 mmol/L 3.4  3.8  3.6   Chloride 98 - 111 mmol/L 93  93  94   CO2 22 - 32 mmol/L 27  26  26    Calcium  8.9 - 10.3 mg/dL 8.4  8.2  8.3   Total Protein 6.5 - 8.1 g/dL   6.6   Total Bilirubin 0.0 - 1.2 mg/dL   0.7   Alkaline Phos 38 - 126 U/L   87   AST 15 - 41 U/L   13   ALT 0 - 44 U/L   15       RADIOGRAPHIC STUDIES: I have personally reviewed the radiological images as listed and agreed with the findings in the report. No results found.    Orders Placed This Encounter  Procedures   DG Chest 2 View    Standing Status:   Future    Number of Occurrences:   1    Expiration Date:   10/24/2024    Reason for Exam (SYMPTOM  OR DIAGNOSIS REQUIRED):   dyspnea and hypoxia, f/u recent pneumia on CT    Preferred imaging location?:   [facility]   All questions were answered. The patient knows to call the clinic with any problems, questions or concerns. No barriers to learning was detected. The total time spent in the appointment was 40 minutes, including review of chart and various tests results, discussions about plan of care and coordination of care plan     Onita Mattock, MD 10/25/2023

## 2023-10-26 ENCOUNTER — Other Ambulatory Visit: Payer: Self-pay

## 2023-10-26 ENCOUNTER — Encounter: Payer: Self-pay | Admitting: *Deleted

## 2023-10-26 DIAGNOSIS — Z170 Estrogen receptor positive status [ER+]: Secondary | ICD-10-CM

## 2023-10-26 NOTE — Progress Notes (Signed)
 As per Dr. Lanny, order was placed on portal for [employer] along with paperwork uploaded. Kit was placed lab to be drawn on 08/01.

## 2023-10-26 NOTE — Progress Notes (Signed)
 Faxed Dr. Demetra last office note to Dr. Dedra Sanders [facility] in [place] [phone].  Also requested if Dr. Sanders is able to see the pt before 11/04/2023.  Fax confirmation received.  Scheduler will reach out to pt to see if she is able to come in earlier to establish care with Dr. Sanders.  Message Received: Patricia [name], MD  Gerard Frederick, NP; Zenaida Morene PARAS, MD; Seena Anders SQUIBB, RN Frederick,  She has a lot of going on lately. Her CT showed multilobar pneumonia versus pneumonitis, her dyspnea on exertion and mild hypoxia on exertion has not improved with antibiotics, so I am starting her on steroids today.  Her recent echo also showed further drop in LVEF to 40 to 45%, even I have been holding her trastuzumab  for the past 6 weeks.  I think she needs to be seen by cardiologist.  Do mind if I ask Dr. Zenaida to see her?  Anders, she is scheduled to see pulmonology at [facility] in a few weeks, please send my office notes to them, and asked them to see if she can be seen sooner.  She may need a bronchoscopy.  [name]

## 2023-10-27 ENCOUNTER — Inpatient Hospital Stay

## 2023-10-27 ENCOUNTER — Telehealth: Payer: Self-pay | Admitting: Hematology

## 2023-10-27 ENCOUNTER — Other Ambulatory Visit: Payer: Self-pay

## 2023-10-27 DIAGNOSIS — C50411 Malignant neoplasm of upper-outer quadrant of right female breast: Secondary | ICD-10-CM

## 2023-10-27 NOTE — Telephone Encounter (Signed)
 Rescheduled appointment per patients request via incoming call. Talked with the patient and she is aware of the changes made to her upcoming appointment.

## 2023-10-27 NOTE — Progress Notes (Signed)
 [facility] Radiology Reading Room to expedite the read on the pt's recent CXR.  MJ stated she would expedite the ad on the CXR.

## 2023-10-28 ENCOUNTER — Inpatient Hospital Stay

## 2023-10-31 ENCOUNTER — Ambulatory Visit (HOSPITAL_COMMUNITY)
Admission: RE | Admit: 2023-10-31 | Discharge: 2023-10-31 | Disposition: A | Discharge status: Home / Self Care | Source: Ambulatory Visit | Attending: Cardiology | Admitting: Cardiology

## 2023-10-31 ENCOUNTER — Encounter (HOSPITAL_COMMUNITY): Payer: Self-pay | Admitting: Cardiology

## 2023-10-31 ENCOUNTER — Ambulatory Visit
Admission: RE | Admit: 2023-10-31 | Discharge: 2023-10-31 | Disposition: A | Discharge status: Home / Self Care | Source: Ambulatory Visit | Attending: Pulmonary Disease | Admitting: Pulmonary Disease

## 2023-10-31 ENCOUNTER — Encounter: Payer: Self-pay | Admitting: Pulmonary Disease

## 2023-10-31 ENCOUNTER — Ambulatory Visit: Admitting: Pulmonary Disease

## 2023-10-31 VITALS — BP 140/90 | HR 84 | Temp 97.7°F | Ht 66.0 in | Wt 160.4 lb

## 2023-10-31 VITALS — BP 124/70 | HR 85 | Wt 161.2 lb

## 2023-10-31 DIAGNOSIS — I25118 Atherosclerotic heart disease of native coronary artery with other forms of angina pectoris: Secondary | ICD-10-CM

## 2023-10-31 DIAGNOSIS — R0602 Shortness of breath: Secondary | ICD-10-CM | POA: Insufficient documentation

## 2023-10-31 DIAGNOSIS — J984 Other disorders of lung: Secondary | ICD-10-CM

## 2023-10-31 DIAGNOSIS — Z79899 Other long term (current) drug therapy: Secondary | ICD-10-CM | POA: Insufficient documentation

## 2023-10-31 DIAGNOSIS — I5022 Chronic systolic (congestive) heart failure: Secondary | ICD-10-CM | POA: Diagnosis not present

## 2023-10-31 DIAGNOSIS — T451X5A Adverse effect of antineoplastic and immunosuppressive drugs, initial encounter: Secondary | ICD-10-CM | POA: Insufficient documentation

## 2023-10-31 DIAGNOSIS — C50411 Malignant neoplasm of upper-outer quadrant of right female breast: Secondary | ICD-10-CM

## 2023-10-31 DIAGNOSIS — I2510 Atherosclerotic heart disease of native coronary artery without angina pectoris: Secondary | ICD-10-CM | POA: Insufficient documentation

## 2023-10-31 DIAGNOSIS — Z170 Estrogen receptor positive status [ER+]: Secondary | ICD-10-CM

## 2023-10-31 DIAGNOSIS — Z2989 Encounter for other specified prophylactic measures: Secondary | ICD-10-CM | POA: Diagnosis not present

## 2023-10-31 MED ORDER — SULFAMETHOXAZOLE-TRIMETHOPRIM 800-160 MG PO TABS: 1.0000 | ORAL_TABLET | ORAL | 1 refills | Status: DC

## 2023-10-31 MED ORDER — METOPROLOL SUCCINATE ER 50 MG PO TB24: 50.0000 mg | ORAL_TABLET | Freq: Every day | ORAL | 3 refills | Status: AC

## 2023-10-31 NOTE — Progress Notes (Unsigned)
 Subjective:    Patient ID: [name], female    DOB: 08-Dec-1955, 68 y.o.   MRN: [id number]  Patient Care Team: Tisovec, [name], MD as PCP - General (Internal Medicine) [name], MD as PCP - Cardiology (Cardiology) [name], MD as Consulting Physician (Hematology) [name], MD as Consulting Physician (General Surgery) [name], MD as Consulting Physician (Radiation Oncology) [name], RN (Inactive) as Oncology Nurse Navigator [name], RN as Oncology Nurse Navigator [name], MD as Consulting Physician (Radiation Oncology)  Chief Complaint  Patient presents with   Consult    Shortness of breath on exertion and at rest. Occasional dry cough.     BACKGROUND:   HPI    Review of Systems A 10 point review of systems was performed and it is as noted above otherwise negative.   Past Medical History:  Diagnosis Date   Anemia    Arthritis    Atypical chest pain    a. 05/2018 CTA chest: No PE; b. 05/2018 MV: No ischemia/scar. EF 65%.   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 05/2018 CT Chest: Coronary and Ao atherosclerotic Calcifications.   Diabetes mellitus without complication (HCC)    Diastolic dysfunction    a. 05/2018 Echo: EF 60-65%, impaired relaxation. Nl RVSP.   Dysrhythmia    PVC's   Family history of adverse reaction to anesthesia    sister-malignant hyperthermia   FH of Malignant hyperthermia    a. sister had malginant hyperthermia   History of kidney stones    History of renal insufficiency    History of skin cancer    Hyperlipemia    Osteoporosis    Pulmonary nodules    a. 05/2018 CT Chest: Small pulm nodules measuring up to 6mm avg diameter. Rec non-contrast Chest CT in 6-12 mos.   Thyroid  nodule     Past Surgical History:  Procedure Laterality Date   AXILLARY LYMPH NODE BIOPSY Right  01/12/2023   Procedure: RIGHT AXILLARY SENTINEL NODE BIOPSY;  Surgeon: [name], MD;  Location: [facility] OR;  Service: General;  Laterality: Right;  LMA   BREAST BIOPSY Right 10/27/2022   MM RT BREAST BX W LOC DEV 1ST LESION IMAGE BX SPEC STEREO GUIDE 10/27/2022 [facility]   BREAST BIOPSY  12/06/2022   MM RT RADIOACTIVE SEED LOC MAMMO GUIDE 12/06/2022 [facility]   BREAST LUMPECTOMY WITH RADIOACTIVE SEED LOCALIZATION Right 12/07/2022   Procedure: RIGHT BREAST LUMPECTOMY WITH RADIOACTIVE SEED LOCALIZATION;  Surgeon: [name], MD;  Location: [facility] OR;  Service: General;  Laterality: Right;   CESAREAN SECTION     CHOLECYSTECTOMY  2006   lap choli with umb hernia   COLONOSCOPY     HYSTEROSCOPY WITH D & C N/A 12/07/2022   Procedure: DILATATION AND CURETTAGE /HYSTEROSCOPY WITH POLYPECTOMY ENDOMETRIAL BIOPSY;  Surgeon: Okey Leader, MD;  Location: [facility] OR;  Service: Gynecology;  Laterality: N/A;   LIPOMA EXCISION Left 03/15/2014   Procedure: EXCISION LIPOMA LEFT LOWER QUARDRANT ABDOMINAL WALL;  Surgeon: Dann Hummer, MD;  Location: [facility];  Service: General;  Laterality: Left;   PORTACATH PLACEMENT N/A 01/12/2023   Procedure: PORT PLACEMENT WITH ULTRASOUND GUIDANCE;  Surgeon: [name], MD;  Location: [facility] OR;  Service: General;  Laterality: N/A;   THYROIDECTOMY N/A 06/25/2019   Procedure: TOTAL THYROIDECTOMY;  Surgeon: Eletha Boas,  MD;  Location: [facility];  Service: General;  Laterality: N/A;   TONSILLECTOMY     TRIGGER FINGER RELEASE  2012   lt small,middle,long   TRIGGER FINGER RELEASE Right 07/14/2017   Procedure: RELEASE TRIGGER FINGER/A-1 PULLEY RIGHT THUMB;  Surgeon: Murrell Kuba, MD;  Location: [facility];  Service: Orthopedics;  Laterality: Right;   UMBILICAL HERNIA REPAIR  2006   with lap choli    Patient Active Problem List   Diagnosis Date Noted   Community acquired pneumonia 10/12/2023   Port-A-Cath in place 02/23/2023    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 61; mets   Breast cancer Sister 63       d. 69s; mets   Heart disease Other     Social History   Tobacco Use   Smoking status: Never   Smokeless tobacco: Never  Substance Use Topics   Alcohol use: No    No Known Allergies  Current Meds  Medication Sig   anastrozole  (ARIMIDEX ) 1 MG tablet TAKE 1 TABLET BY MOUTH EVERY DAY   aspirin  EC 81 MG tablet Take 81 mg by mouth daily.   ferrous sulfate  325 (65 FE) MG tablet Take 325 mg by mouth daily.   Insulin  Glargine (BASAGLAR  KWIKPEN) 100 UNIT/ML SOPN Inject 35 Units into the skin at bedtime.   insulin  lispro (HUMALOG) 100 UNIT/ML injection Inject 10-15 Units into the skin 3 (three) times daily before meals. Sliding scale   levothyroxine  (SYNTHROID ) 112 MCG tablet Take 112 mcg by mouth daily before breakfast.   metFORMIN  (GLUCOPHAGE ) 1000 MG tablet Take 1,000 mg by mouth 2 (two) times daily with a meal.   metoprolol  succinate (TOPROL  XL) 25 MG 24 hr tablet Take 1.5 tablets (37.5 mg total) by mouth daily.   nitrofurantoin (MACRODANTIN) 100 MG capsule Take 100 mg by mouth daily.   Nutritional Supplements (JUICE PLUS FIBRE PO) Take 3 tablets by mouth daily. Juice Plus Vegetable   Nutritional Supplements (JUICE PLUS FIBRE PO) Take 3 tablets by mouth daily. Juice Plus Fruit   predniSONE  (DELTASONE ) 20 MG tablet Take 3 tablets (60 mg total) by mouth daily with breakfast. For 1 weeks then decrease to 2 tabs daily for 2 weeks.  Further tapering per MD   ramipril  (ALTACE ) 5 MG capsule Take 5 mg by mouth daily with breakfast.   rosuvastatin  (CRESTOR ) 20 MG tablet Take 20 mg by mouth daily with supper.    triamcinolone  cream (KENALOG ) 0.1 % Apply 1 Application topically daily as needed (dermatitis).    Immunization History  Administered Date(s) Administered   PFIZER(Purple Top)SARS-COV-2 Vaccination 05/10/2019, 06/06/2019        Objective:     BP (!) 140/90 (BP Location: Left Arm, Patient Position: Sitting, Cuff Size: Normal)   Pulse 84   Temp 97.7 F (36.5 C) (Oral)   Ht 5' 6 (1.676 m)   Wt 160 lb 6.4 oz (72.8 kg)   SpO2 97%  BMI 25.89 kg/m   SpO2: 97 %  GENERAL: HEAD: Normocephalic, atraumatic.  EYES: Pupils equal, round, reactive to light.  No scleral icterus.  MOUTH:  NECK: Supple. No thyromegaly. Trachea midline. No JVD.  No adenopathy. PULMONARY: Good air entry bilaterally.  No adventitious sounds. CARDIOVASCULAR: S1 and S2. Regular rate and rhythm.  ABDOMEN: MUSCULOSKELETAL: No joint deformity, no clubbing, no edema.  NEUROLOGIC:  SKIN: Intact,warm,dry. PSYCH:  Ambulatory oxymetry was performed today:  At rest on room air oxygen saturation was XXX, the patient ambulated at a XXX pace, completed XXX laps, O2 nadirXXX, XXX shortness of breath.  Resting heart rate was XXX bpm at maximum for this exercise XXX bpm.       Assessment & Plan:   No diagnosis found.  No orders of the defined types were placed in this encounter.   No orders of the defined types were placed in this encounter.    Advised if symptoms do not improve or worsen, to please contact office for sooner follow up or seek emergency care.    I spent xxx minutes of dedicated to the care of this patient on the date of this encounter to include pre-visit review of records, face-to-face time with the patient discussing conditions above, post visit ordering of testing, clinical documentation with the electronic health  record, making appropriate referrals as documented, and communicating necessary findings to members of the patients care team.   C. Leita Sanders, MD Advanced Bronchoscopy PCCM [facility]    *This note was dictated using voice recognition software/Dragon.  Despite best efforts to proofread, errors can occur which can change the meaning. Any transcriptional errors that result from this process are unintentional and may not be fully corrected at the time of dictation.

## 2023-10-31 NOTE — Patient Instructions (Signed)
 INCREASE Metoprolol  to 50 mg daily.  Your physician recommends that you schedule a follow-up appointment in: 3 months ( November) ** PLEASE CALL THE OFFICE IN SEPTEMBER TO ARRANGE YOUR FOLLOW UP APPOINTMENT.**  If you have any questions or concerns before your next appointment please send us  a message through [place] or call our office at [phone].    TO LEAVE A MESSAGE FOR THE NURSE SELECT OPTION 2, PLEASE LEAVE A MESSAGE INCLUDING: YOUR NAME DATE OF BIRTH CALL BACK NUMBER REASON FOR CALL**this is important as we prioritize the call backs  YOU WILL RECEIVE A CALL BACK THE SAME DAY AS LONG AS YOU CALL BEFORE 4:00 PM  At the [facility], you and your health needs are our priority. As part of our continuing mission to provide you with exceptional heart care, we have created designated Provider Care Teams. These Care Teams include your primary Cardiologist (physician) and Advanced Practice Providers (APPs- Physician Assistants and Nurse Practitioners) who all work together to provide you with the care you need, when you need it.   You may see any of the following providers on your designated Care Team at your next follow up: Dr Toribio Fuel Dr Ezra Shuck Dr. Ria Commander Dr. Morene Brownie Amy Lenetta, NP Caffie Shed, [place] Angelina [facility] [place], [place] Beckey Coe, NP [place] Lee, NP Ellouise Class, NP Tinnie Redman, PharmD Jaun Bash, PharmD   Please be sure to bring in all your medications bottles to every appointment.    Thank you for choosing [facility]

## 2023-10-31 NOTE — Patient Instructions (Signed)
 VISIT SUMMARY:  During your visit today, we discussed your ongoing issues with pneumonia and the side effects of your breast cancer treatment. We reviewed your recent hospital stay, current medications, and the impact of prednisone  on your blood sugar levels. We also discussed the need for continued monitoring and adjustments to your treatment plan.  YOUR PLAN:  -PNEUMONITIS DUE TO TRASTUZUMAB  THERAPY: Pneumonitis is inflammation of the lung tissue, likely caused by your breast cancer medication, trastuzumab . We have stopped trastuzumab  and started you on a prednisone  taper, which is helping to improve your oxygen levels. You will continue taking prednisone  at 60 mg daily for one more day, then reduce to 40 mg daily for two weeks, followed by further tapering. We will also order a chest x-ray today to check your lung status and monitor your oxygen levels during activity. Additionally, you will take a sulfa  tablet to prevent infection due to the immunosuppressive effects of prednisone .  This is a Bactrim  tablet that you will take Monday Wednesday Fridays (1 tablet 3 times a week).  -MULTIFOCAL PNEUMONIA, RESOLVING: Multifocal pneumonia is an infection in multiple areas of your lungs. Although initially treated with antibiotics, your symptoms have improved with prednisone , indicating inflammation rather than an active infection. We will continue to monitor your symptoms and response to prednisone , and avoid further antibiotics at this time.  -TYPE 2 DIABETES MELLITUS, ON INSULIN : Type 2 diabetes is a condition where your body does not use insulin  properly, leading to high blood sugar levels. Your current treatment includes four insulin  shots per day. The high-dose prednisone  can cause fluctuations in your blood sugar, so it is important to monitor your blood glucose levels closely, especially during this treatment.  INSTRUCTIONS:  Please follow the prednisone  taper schedule as discussed: 60 mg daily for  one more day, then 40 mg daily for two weeks, followed by further tapering. Get a chest x-ray today to assess your lung status. Monitor your oxygen levels during activity and keep track of your blood glucose levels closely. Take the prescribed sulfa  tablet to prevent infection. If you notice any worsening of symptoms or have concerns, please contact our office.

## 2023-11-01 ENCOUNTER — Telehealth: Payer: Self-pay | Admitting: Dietician

## 2023-11-01 NOTE — Telephone Encounter (Signed)
 Patient screened on MST. First attempt to reach. Provided my cell# on voice mail to return call to set up a nutrition consult.  Gennaro Africa, RDN, LDN Registered Dietitian, [facility] Part Time Remote (Usual office hours: Tuesday-Thursday) Cell: [phone]

## 2023-11-02 ENCOUNTER — Encounter: Payer: Self-pay | Admitting: Pulmonary Disease

## 2023-11-03 ENCOUNTER — Telehealth: Payer: Self-pay | Admitting: Dietician

## 2023-11-03 ENCOUNTER — Ambulatory Visit: Admitting: Pulmonary Disease

## 2023-11-03 NOTE — Telephone Encounter (Signed)
Patient screened on MST. Second attempt to reach. Provided my cell# on voice mail to return call to set up a nutrition consult.  April Manson, RDN, LDN Registered Dietitian, [place] Part Time Remote (Usual office hours: Tuesday-Thursday) Cell: [phone]

## 2023-11-04 NOTE — Progress Notes (Signed)
 Cardio-Oncology Clinic Consult Note   Referring Physician: Dr. Lanny Primary Care: Primary Cardiologist:  HPI:  [name] is a 68 y.o. female with past medical history of breast cancer on therapy who has been referred by Dr. Lanny to establish in the cardio-oncology clinic for monitoring of cardio-toxicity while undergoing chemotherapy.        Oncology History  Malignant neoplasm of upper-outer quadrant of right breast in female, estrogen receptor positive (HCC)  10/27/2022 Cancer Staging   Staging form: Breast, AJCC 8th Edition - Clinical stage from 10/27/2022: Stage 0 (cTis (DCIS), cN0, cM0, G3, ER+, PR-, HER2: Not Assessed) - Signed by [name], MD on 11/03/2022 Stage prefix: Initial diagnosis Histologic grading system: 3 grade system   11/01/2022 Initial Diagnosis   Ductal carcinoma in situ (DCIS) of right breast   11/09/2022 Genetic Testing   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  No other deleterious variants in [facility] Common Hereditary Cancers +RNA Panel. Report date is 11/09/2022.    The [facility] Common Hereditary Cancers + RNA Panel includes sequencing, deletion/duplication, and RNA analysis of the following 48 genes: APC, ATM, AXIN2, BAP1, BARD1, BMPR1A, BRCA1, BRCA2, BRIP1, CDH1, CDK4*, CDKN2A*, CHEK2, CTNNA1, DICER1, EPCAM* (del/dup only), FH, GREM1* (promoter dup analysis only), HOXB13*, KIT*, MBD4*, MEN1, MLH1, MSH2, MSH3, MSH6, MUTYH, NF1, NTHL1, PALB2, PDGFRA*, PMS2, POLD1, POLE, PTEN, RAD51C, RAD51D, SDHA (sequencing only), SDHB, SDHC, SDHD, SMAD4, SMARCA4, STK11, TP53, TSC1, TSC2, VHL.  *Genes without RNA analysis.    12/07/2022 Cancer Staging   Staging form: Breast, AJCC 8th Edition - Pathologic stage from 12/07/2022: Stage IA (pT1a, pN0, cM0, G2, ER+, PR-, HER2+) - Signed by [name], MD on 12/20/2022 Stage prefix: Initial diagnosis Histologic grading system: 3 grade system Residual tumor (R): R0 - None   02/02/2023 -  Chemotherapy    Patient is on Treatment Plan : BREAST Abraxane  + Trastuzumab  q7d / Trastuzumab  q21d           Patient previously followed with general cardiology clinic for echocardiogram showing mildly reduced ejection fraction.  She has HER2 positive breast cancer and has been maintained on trastuzumab  recently.  Has overall done well otherwise until recently where she has been admitted for shortness of breath and hypoxia.  She has been seen by pulmonology, who has been concerned that she is exhibiting signs of pneumonitis due to trastuzumab  therapy.  The infiltrates are improving, she is feeling better, and she is currently on a high dose steroid taper which seems to be improving her symptoms.  Denies any lower extremity swelling, orthopnea, PND.   Medical history and current medications were reviewed in Epic as part of clinic visit.   PHYSICAL EXAM: Vitals:   10/31/23 1522  BP: 124/70  Pulse: 85  SpO2: 97%   GENERAL: Well nourished and in no apparent distress at rest.  HEENT: The mucous membranes are pink and moist.   PULM: Mild diffuse rhonchi CARDIAC:  JVP: Flat         Normal rate with regular rhythm. No murmurs, rubs or gallops.  Trace edema.  ABDOMEN: Soft, non-tender, non-distended. NEUROLOGIC: Patient is oriented x3 with no focal or lateralizing neurologic deficits.  PSYCH: Patients affect is appropriate, there is no evidence of anxiety or depression.  SKIN: Warm and dry; no lesions or wounds. Warm and well perfused extremities.    ASSESSMENT & PLAN:  Cardiotoxicity from trastuzumab : Patient with echocardiogram showing mildly reduced ejection fraction, with continued drop to now 40 to 45% while  on therapy.  From a cardiac standpoint, would recommend holding trastuzumab  while medical therapy is titrated.  Trastuzumab  is already on hold given pneumonitis and is on a steroid taper. - Discussed plan for optimization of medical therapy and repeat echocardiogram prior to recommendation to  restart from a cardiac standpoint - Though BNP is mildly elevated, based on her description of symptoms suspect that they are mainly pulmonary - Increase metoprolol  to 50 mg daily - Continue ramipril  5 mg daily, pending blood pressure at next visit would recommend transition to [facility] - Euvolemic, no need for diuretics - Recommendation to hold therapy at this time - If no improvement can consider coronary evaluation  Pneumonitis: Follows with pulmonary, saw earlier today. - Continue high-dose steroid taper, on prophylaxis  Coronary artery calcifications: Noted on prior CT scans. - Continue aspirin  81 mg daily, rosuvastatin  20 mg daily  Explained incidence of trastuzumab  cardiotoxicity and role of Cardio-oncology clinic at length. Echo images reviewed personally.  Mild to moderately reduced ejection fraction, worsening from prior.  Would continue to hold trastuzumab  at this time.  Reviewed signs and symptoms of HF to look for.    Morene Brownie, MD Advanced Heart Failure Mechanical Circulatory Support Cardio-Oncology 11/04/23

## 2023-11-07 ENCOUNTER — Encounter: Payer: Self-pay | Admitting: Hematology

## 2023-11-07 NOTE — Assessment & Plan Note (Addendum)
 pT1aN0M0, stage IA, G2, HER2 positive, ER positive, PR negative, HER2+, and DCIS(+) It was discovered on screening mammogram, initial biopsy showed DCIS only.   -Status post a right lumpectomy.  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 Margins are negative for invasive cancer, but DCIS margin was close. No lymphovascular or perineural invasion. Lymph node biopsy was not performed, Dr. Ebbie has ordered an ultrasound of the right axilla for further evaluation.  If ultrasound is negative, I think it is okay not to have sentinel lymph node biopsy given the small primary tumor. -Given the aggressive nature of her HER2 positive disease, and moderate risk of recurrence, I do recommend adjuvant chemotherapy with Taxol  or Abraxane  once weekly for 12 weeks. -she underwent port placement and right axillary SLN biopsy on 01/12/23 and all 3 nodes were negative   -She started to weekly paclitaxel  and trastuzumab  on February 02, 2023, completed Taxol  on 1/213/2025, plan for adjuvant trastuzumab  for 1 year -she started adjuvant anastrozole  in late April 2025 - Trastuzumab  injection has been held since early June 2025, due to multiple UTI, bronchitis, and hospital admission for multilobar pneumonia in July 2025. Due to her CT findings and poor response to antibiotics, I suspect she has pneumonitis, possible from trastuzumab . I started her on high dose prednisone  on 10/25/2023.

## 2023-11-08 ENCOUNTER — Inpatient Hospital Stay: Attending: Hematology

## 2023-11-08 ENCOUNTER — Inpatient Hospital Stay: Admitting: Hematology

## 2023-11-08 VITALS — BP 139/74 | HR 79 | Temp 98.0°F | Resp 17 | Ht 66.0 in | Wt 161.1 lb

## 2023-11-08 DIAGNOSIS — Z1722 Progesterone receptor negative status: Secondary | ICD-10-CM | POA: Diagnosis not present

## 2023-11-08 DIAGNOSIS — Z79899 Other long term (current) drug therapy: Secondary | ICD-10-CM | POA: Insufficient documentation

## 2023-11-08 DIAGNOSIS — D649 Anemia, unspecified: Secondary | ICD-10-CM

## 2023-11-08 DIAGNOSIS — C50411 Malignant neoplasm of upper-outer quadrant of right female breast: Secondary | ICD-10-CM | POA: Diagnosis not present

## 2023-11-08 DIAGNOSIS — Z170 Estrogen receptor positive status [ER+]: Secondary | ICD-10-CM | POA: Insufficient documentation

## 2023-11-08 DIAGNOSIS — Z862 Personal history of diseases of the blood and blood-forming organs and certain disorders involving the immune mechanism: Secondary | ICD-10-CM | POA: Diagnosis not present

## 2023-11-08 DIAGNOSIS — Z79811 Long term (current) use of aromatase inhibitors: Secondary | ICD-10-CM | POA: Insufficient documentation

## 2023-11-08 DIAGNOSIS — I509 Heart failure, unspecified: Secondary | ICD-10-CM | POA: Insufficient documentation

## 2023-11-08 DIAGNOSIS — J702 Acute drug-induced interstitial lung disorders: Secondary | ICD-10-CM | POA: Diagnosis not present

## 2023-11-08 DIAGNOSIS — D0511 Intraductal carcinoma in situ of right breast: Secondary | ICD-10-CM

## 2023-11-08 LAB — CBC WITH DIFFERENTIAL (CANCER CENTER ONLY)
Abs Immature Granulocytes: 0.27 K/uL — ABNORMAL HIGH (ref 0.00–0.07)
Basophils Absolute: 0.1 K/uL (ref 0.0–0.1)
Basophils Relative: 1 %
Eosinophils Absolute: 0 K/uL (ref 0.0–0.5)
Eosinophils Relative: 0 %
HCT: 38.7 % (ref 36.0–46.0)
Hemoglobin: 12.7 g/dL (ref 12.0–15.0)
Immature Granulocytes: 2 %
Lymphocytes Relative: 8 %
Lymphs Abs: 1 K/uL (ref 0.7–4.0)
MCH: 25.7 pg — ABNORMAL LOW (ref 26.0–34.0)
MCHC: 32.8 g/dL (ref 30.0–36.0)
MCV: 78.3 fL — ABNORMAL LOW (ref 80.0–100.0)
Monocytes Absolute: 0.5 K/uL (ref 0.1–1.0)
Monocytes Relative: 4 %
Neutro Abs: 10.6 K/uL — ABNORMAL HIGH (ref 1.7–7.7)
Neutrophils Relative %: 85 %
Platelet Count: 421 K/uL — ABNORMAL HIGH (ref 150–400)
RBC: 4.94 MIL/uL (ref 3.87–5.11)
RDW: 18.6 % — ABNORMAL HIGH (ref 11.5–15.5)
WBC Count: 12.6 K/uL — ABNORMAL HIGH (ref 4.0–10.5)
nRBC: 0 % (ref 0.0–0.2)

## 2023-11-08 LAB — FERRITIN: Ferritin: 45 ng/mL (ref 11–307)

## 2023-11-08 LAB — CMP (CANCER CENTER ONLY)
ALT: 9 U/L (ref 0–44)
AST: 9 U/L — ABNORMAL LOW (ref 15–41)
Albumin: 4.1 g/dL (ref 3.5–5.0)
Alkaline Phosphatase: 66 U/L (ref 38–126)
Anion gap: 7 (ref 5–15)
BUN: 8 mg/dL (ref 8–23)
CO2: 31 mmol/L (ref 22–32)
Calcium: 9.8 mg/dL (ref 8.9–10.3)
Chloride: 96 mmol/L — ABNORMAL LOW (ref 98–111)
Creatinine: 0.56 mg/dL (ref 0.44–1.00)
GFR, Estimated: >60 mL/min (ref >60)
Glucose, Bld: 50 mg/dL — ABNORMAL LOW (ref 70–99)
Potassium: 4.2 mmol/L (ref 3.5–5.1)
Sodium: 134 mmol/L — ABNORMAL LOW (ref 135–145)
Total Bilirubin: 0.5 mg/dL (ref 0.0–1.2)
Total Protein: 7 g/dL (ref 6.5–8.1)

## 2023-11-08 MED ORDER — PREDNISONE 10 MG PO TABS: 30.0000 mg | ORAL_TABLET | Freq: Every day | ORAL | 0 refills | Status: DC

## 2023-11-08 NOTE — Progress Notes (Signed)
 [name]   [phone] Fax:([phone]) [phone]   Clinic Follow up Note   Patient Care Team: Tisovec, [name], MD as PCP - General (Internal Medicine) [name], MD as PCP - Cardiology (Cardiology) [name], MD as Consulting Physician (Hematology) [name], MD as Consulting Physician (General Surgery) [name], MD as Consulting Physician (Radiation Oncology) [name], RN (Inactive) as Oncology Nurse Navigator [name], RN as Oncology Nurse Navigator [name], MD as Consulting Physician (Radiation Oncology)  Date of Service:  11/08/2023  CHIEF COMPLAINT: f/u of breast cancer  CURRENT THERAPY:  Adjuvant anastrozole   Oncology History   Malignant neoplasm of upper-outer quadrant of right breast in female, estrogen receptor positive (HCC) pT1aN0M0, stage IA, G2, HER2 positive, ER positive, PR negative, HER2+, and DCIS(+) It was discovered on screening mammogram, initial biopsy showed DCIS only.   -Status post a right lumpectomy.  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 Margins are negative for invasive cancer, but DCIS margin was close. No lymphovascular or perineural invasion. Lymph node biopsy was not performed, Dr. Ebbie has ordered an ultrasound of the right axilla for further evaluation.  If ultrasound is negative, I think it is okay not to have sentinel lymph node biopsy given the small primary tumor. -Given the aggressive nature of her HER2 positive disease, and moderate risk of recurrence, I do recommend adjuvant chemotherapy with Taxol  or Abraxane  once weekly for 12 weeks. -she underwent port placement and right axillary SLN biopsy on 01/12/23 and all 3 nodes were negative   -She started to weekly paclitaxel  and trastuzumab  on February 02, 2023, completed Taxol  on 1/213/2025, plan for adjuvant trastuzumab  for 1  year -she started adjuvant anastrozole  in late April 2025 - Trastuzumab  injection has been held since early June 2025, due to multiple UTI, bronchitis, and hospital admission for multilobar pneumonia in July 2025. Due to her CT findings and poor response to antibiotics, I suspect she has pneumonitis, possible from trastuzumab . I started her on high dose prednisone  on 10/25/2023.    Assessment & Plan Breast cancer on hormonal therapy, not on trastuzumab  Breast cancer is managed with anastrozole . Trastuzumab  was discontinued due to cardiac and pulmonary concerns. No issues with anastrozole . Recent mammogram and ultrasound were normal. Discussed breast MRI for additional screening, which is effective for early detection but more costly than mammography. - Continue anastrozole  therapy - Consider breast MRI for additional screening - Schedule follow-up in 2-3 months  Trastuzumab -induced pneumonitis, improving She is improving with prednisone . Oxygen levels have improved, and bronchoscopy is not indicated per her pulmonologist.  - Recent chest x-ray shows significant improvement. Prednisone  tapering plan discussed. - Continue prednisone  taper: 40 mg for 2 weeks, then decrease by 10 mg each week until 5 mg daily for a week then off - Call in new prescription for prednisone  - Monitor oxygen levels, heart rate, and symptoms - Follow up with pulmonologist on September 5  Heart failure, under management Heart failure is managed by cardiologist. Metoprolol  increased to 50 mg to enhance cardiac function. Cardiologist follow-up scheduled for November. - Continue current heart failure management plan - Follow up with cardiologist in November  Anemia, resolved Anemia has resolved. Blood counts improved, platelet count near normal. Slightly elevated white blood cell count due to steroid use. Iron supplementation duration to be evaluated. - Evaluate need for continued iron supplementation  Plan - She  is  clinically doing better, hypoxia has resolved, recent chest x-ray showed significant improvement - Continue prednisone  tapering, I have 40 mg daily for 2 weeks, she would decrease 10 mg every week onto 5 mg daily for a week then stop. - She will follow-up with pulmonologist next month - Lab and follow-up in 2 months - Continue anastrozole .  No plan to restart trastuzumab .   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  No other deleterious variants in [facility] Common Hereditary Cancers +RNA Panel. Report date is 11/09/2022.    The [employer] Common Hereditary Cancers + RNA Panel includes sequencing, deletion/duplication, and RNA analysis of the following 48 genes: APC, ATM, AXIN2, BAP1, BARD1, BMPR1A, BRCA1, BRCA2, BRIP1, CDH1, CDK4*, CDKN2A*, CHEK2, CTNNA1, DICER1, EPCAM* (del/dup only), FH, GREM1* (promoter dup analysis only), HOXB13*, KIT*, MBD4*, MEN1, MLH1, MSH2, MSH3, MSH6, MUTYH, NF1, NTHL1, PALB2, PDGFRA*, PMS2, POLD1, POLE, PTEN, RAD51C, RAD51D, SDHA (sequencing only), SDHB, SDHC, SDHD, SMAD4, SMARCA4, STK11, TP53, TSC1, TSC2, VHL.  *Genes without RNA analysis.    12/07/2022 Cancer Staging   Staging form: Breast, AJCC 8th Edition - Pathologic stage from 12/07/2022: Stage IA (pT1a, pN0, cM0, G2, ER+, PR-, HER2+) - Signed by [name], MD on 12/20/2022 Stage prefix: Initial diagnosis Histologic grading system: 3 grade system Residual  tumor (R): R0 - None   02/02/2023 -  Chemotherapy   Patient is on Treatment Plan : BREAST Abraxane  + Trastuzumab  q7d / Trastuzumab  q21d        Discussed the use of AI scribe software for clinical note transcription with the patient, who gave verbal consent to proceed.  History of Present Illness [name] is a 68 year old female with breast cancer who presents for follow-up.  She is on anastrozole  without issues. Trastuzumab  was discontinued due to heart and lung issues. A mammogram and ultrasound in July were normal. Recent blood tests showed resolved anemia and improved platelet counts, with a slightly elevated white count. She has been taking iron supplements.  Lung issues are attributed to pneumonitis, and she is on a tapering dose of prednisone . Oxygen levels have improved to 94-96%. She has a history of heart failure, and her cardiologist increased metoprolol  to 50 mg. She will follow up with her cardiologist in November for an echocardiogram.  Energy levels have improved but are not back to normal. Oxygen levels have improved, and there are no current issues with anastrozole .     All other systems were reviewed with the patient and are negative.  MEDICAL HISTORY:  Past Medical History:  Diagnosis Date   Anemia    Arthritis    Atypical chest pain    a. 05/2018 CTA chest: No PE; b. 05/2018 MV: No ischemia/scar. EF 65%.   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 05/2018 CT Chest: Coronary and Ao atherosclerotic Calcifications.  Diabetes mellitus without complication (HCC)    Diastolic dysfunction    a. 05/2018 Echo: EF 60-65%, impaired relaxation. Nl RVSP.   Dysrhythmia    PVC's   Family history of adverse reaction to anesthesia    sister-malignant hyperthermia   FH of Malignant hyperthermia    a. sister had malginant hyperthermia   History of kidney stones     History of renal insufficiency    History of skin cancer    Hyperlipemia    Osteoporosis    Pulmonary nodules    a. 05/2018 CT Chest: Small pulm nodules measuring up to 6mm avg diameter. Rec non-contrast Chest CT in 6-12 mos.   Thyroid  nodule     SURGICAL HISTORY: Past Surgical History:  Procedure Laterality Date   AXILLARY LYMPH NODE BIOPSY Right 01/12/2023   Procedure: RIGHT AXILLARY SENTINEL NODE BIOPSY;  Surgeon: [name], MD;  Location: [facility] OR;  Service: General;  Laterality: Right;  LMA   BREAST BIOPSY Right 10/27/2022   MM RT BREAST BX W LOC DEV 1ST LESION IMAGE BX SPEC STEREO GUIDE 10/27/2022 [facility]   BREAST BIOPSY  12/06/2022   MM RT RADIOACTIVE SEED LOC MAMMO GUIDE 12/06/2022 [facility]   BREAST LUMPECTOMY WITH RADIOACTIVE SEED LOCALIZATION Right 12/07/2022   Procedure: RIGHT BREAST LUMPECTOMY WITH RADIOACTIVE SEED LOCALIZATION;  Surgeon: [name], MD;  Location: [facility] OR;  Service: General;  Laterality: Right;   CESAREAN SECTION     CHOLECYSTECTOMY  2006   lap choli with umb hernia   COLONOSCOPY     HYSTEROSCOPY WITH D & C N/A 12/07/2022   Procedure: DILATATION AND CURETTAGE /HYSTEROSCOPY WITH POLYPECTOMY ENDOMETRIAL BIOPSY;  Surgeon: Okey Leader, MD;  Location: [facility] OR;  Service: Gynecology;  Laterality: N/A;   LIPOMA EXCISION Left 03/15/2014   Procedure: EXCISION LIPOMA LEFT LOWER QUARDRANT ABDOMINAL WALL;  Surgeon: Dann Hummer, MD;  Location: [facility];  Service: General;  Laterality: Left;   PORTACATH PLACEMENT N/A 01/12/2023   Procedure: PORT PLACEMENT WITH ULTRASOUND GUIDANCE;  Surgeon: [name], MD;  Location: [facility] OR;  Service: General;  Laterality: N/A;   THYROIDECTOMY N/A 06/25/2019   Procedure: TOTAL THYROIDECTOMY;  Surgeon: Eletha Boas, MD;  Location: [facility] ORS;  Service: General;  Laterality: N/A;   TONSILLECTOMY     TRIGGER FINGER RELEASE  2012   lt small,middle,long   TRIGGER FINGER RELEASE Right  07/14/2017   Procedure: RELEASE TRIGGER FINGER/A-1 PULLEY RIGHT THUMB;  Surgeon: Murrell Kuba, MD;  Location: [facility];  Service: Orthopedics;  Laterality: Right;   UMBILICAL HERNIA REPAIR  2006   with lap choli    I have reviewed the social history and family history with the patient and they are unchanged from previous note.  ALLERGIES:  has no known allergies.  MEDICATIONS:  Current Outpatient Medications  Medication Sig Dispense Refill   predniSONE  (DELTASONE ) 10 MG tablet Take 3 tablets (30 mg total) by mouth daily with breakfast. Take for one week then decrease to 2 tab, 1 tab and 0.5 tab daily for week 2, 3 and 4 46 tablet 0   anastrozole  (ARIMIDEX ) 1 MG tablet TAKE 1 TABLET BY MOUTH EVERY DAY 90 tablet 1   aspirin  EC 81 MG tablet Take 81 mg by mouth daily.     ferrous sulfate  325 (65 FE) MG tablet Take 325 mg by mouth daily.     Insulin  Glargine (BASAGLAR  KWIKPEN) 100 UNIT/ML SOPN Inject 35 Units into the skin at bedtime.  insulin  lispro (HUMALOG) 100 UNIT/ML injection Inject 10-15 Units into the skin 3 (three) times daily before meals. Sliding scale     levothyroxine  (SYNTHROID ) 112 MCG tablet Take 112 mcg by mouth daily before breakfast.     metFORMIN  (GLUCOPHAGE ) 1000 MG tablet Take 1,000 mg by mouth 2 (two) times daily with a meal.     metoprolol  succinate (TOPROL  XL) 50 MG 24 hr tablet Take 1 tablet (50 mg total) by mouth daily. 90 tablet 3   nitrofurantoin (MACRODANTIN) 100 MG capsule Take 100 mg by mouth daily.     Nutritional Supplements (JUICE PLUS FIBRE PO) Take 3 tablets by mouth daily. Juice Plus Vegetable     Nutritional Supplements (JUICE PLUS FIBRE PO) Take 3 tablets by mouth daily. Juice Plus Fruit     ramipril  (ALTACE ) 5 MG capsule Take 5 mg by mouth daily with breakfast.     rosuvastatin  (CRESTOR ) 20 MG tablet Take 20 mg by mouth daily with supper.      sulfamethoxazole -trimethoprim  (BACTRIM  DS) 800-160 MG tablet Take 1 tablet by mouth 3 (three)  times a week. M-W-F 12 tablet 1   triamcinolone  cream (KENALOG ) 0.1 % Apply 1 Application topically daily as needed (dermatitis).     No current facility-administered medications for this visit.    PHYSICAL EXAMINATION: ECOG PERFORMANCE STATUS: 1 - Symptomatic but completely ambulatory  Vitals:   11/08/23 1046  BP: 139/74  Pulse: 79  Resp: 17  Temp: 98 F (36.7 C)  SpO2: 100%   Wt Readings from Last 3 Encounters:  11/08/23 161 lb 1.6 oz (73.1 kg)  10/31/23 161 lb 3.2 oz (73.1 kg)  10/31/23 160 lb 6.4 oz (72.8 kg)     GENERAL:alert, no distress and comfortable SKIN: skin color, texture, turgor are normal, no rashes or significant lesions Musculoskeletal:no cyanosis of digits and no clubbing  NEURO: alert & oriented x 3 with fluent speech, no focal motor/sensory deficits  Physical Exam    LABORATORY DATA:  I have reviewed the data as listed    Latest Ref Rng & Units 11/08/2023   10:39 AM 10/15/2023    4:32 AM 10/14/2023    5:02 AM  CBC  WBC 4.0 - 10.5 K/uL 12.6  9.9  9.0   Hemoglobin 12.0 - 15.0 g/dL 87.2  9.9  89.9   Hematocrit 36.0 - 46.0 % 38.7  29.4  31.3   Platelets 150 - 400 K/uL 421  663  645         Latest Ref Rng & Units 11/08/2023   10:39 AM 10/15/2023    4:32 AM 10/14/2023    5:02 AM  CMP  Glucose 70 - 99 mg/dL 50  740  762   BUN 8 - 23 mg/dL 8  7  7    Creatinine 0.44 - 1.00 mg/dL 9.43  9.33  9.47   Sodium 135 - 145 mmol/L 134  131  129   Potassium 3.5 - 5.1 mmol/L 4.2  3.4  3.8   Chloride 98 - 111 mmol/L 96  93  93   CO2 22 - 32 mmol/L 31  27  26    Calcium  8.9 - 10.3 mg/dL 9.8  8.4  8.2   Total Protein 6.5 - 8.1 g/dL 7.0     Total Bilirubin 0.0 - 1.2 mg/dL 0.5     Alkaline Phos 38 - 126 U/L 66     AST 15 - 41 U/L 9     ALT 0 - 44 U/L 9  RADIOGRAPHIC STUDIES: I have personally reviewed the radiological images as listed and agreed with the findings in the report. No results found.    No orders of the defined types were placed in  this encounter.  All questions were answered. The patient knows to call the clinic with any problems, questions or concerns. No barriers to learning was detected. The total time spent in the appointment was 30 minutes, including review of chart and various tests results, discussions about plan of care and coordination of care plan     Onita Mattock, MD 11/08/2023

## 2023-11-09 LAB — GUARDANT REVEAL

## 2023-11-10 ENCOUNTER — Inpatient Hospital Stay: Admitting: Dietician

## 2023-11-10 NOTE — Progress Notes (Signed)
 Nutrition Assessment  Patient return message left on her voice mail.  Reason for Assessment: MST screen for weight loss.    ASSESSMENT: Patient is a 68 year old female who is followed at cancer center after treatment for her right breast cancer.  She is s/p lumpectomy and completed her Taxol  chemo 03/10/2024.  Her current treatment is anastrozole . Trastuzumab  was discontinued due to cardiac and pulmonary concerns. She was also prescribed prednisone  last month to treat pneumonitis. Prednisone  now being tapered.  Patient reports eating better now. Loving vinegary foods like marinated tomato, cucumbers.  She reports Eating 3 meals a day.  Take fruit and vegetable supplements but no calcium  or vitamin D.  She also denies much fortified dairy to achieve DRI for those nutrients. Has been IDDM since she was in her twenties and know she can' skip meals.  She wasn't concerned with weight loss and feel like she could even stand to lose more.   Anthropometrics: weight loss 19# (10%) which is clinically significant.  Height: 66 Weight: 161# UBW: 180# BMI: 26    NUTRITION DIAGNOSIS: Food and Nutrition Related Knowledge Deficit related to cancer and associated treatments as evidenced by no prior need for nutrition related information.   INTERVENTION:   Educated on importance of adequate calorie and protein energy intake  with nutrient dense foods when possible to maintain lean body mass.  Discussed strategies for increasing vit D and calcium  and suggested she have PCP monitor serum vit D  Emailed Nutrition Tip sheet  for  Breast cancer survivors and flyer for Virtual Nutrition Class with contact information provided   MONITORING, EVALUATION, GOAL: weight, PO intake, Nutrition Impact Symptoms, labs  Goal is weight maintenance  Next Visit: PRN at patient or provider request  Micheline Craven, RDN, LDN Registered Dietitian, [facility] Part Time Remote (Usual office hours:  Tuesday-Thursday) Cell: [phone]

## 2023-11-12 ENCOUNTER — Other Ambulatory Visit: Payer: Self-pay | Admitting: Hematology

## 2023-11-22 ENCOUNTER — Inpatient Hospital Stay: Admitting: Hematology

## 2023-11-22 ENCOUNTER — Inpatient Hospital Stay: Attending: Hematology

## 2023-11-23 ENCOUNTER — Encounter: Payer: Self-pay | Admitting: *Deleted

## 2023-11-23 DIAGNOSIS — Z170 Estrogen receptor positive status [ER+]: Secondary | ICD-10-CM

## 2023-12-02 ENCOUNTER — Encounter: Payer: Self-pay | Admitting: Pulmonary Disease

## 2023-12-02 ENCOUNTER — Ambulatory Visit: Admitting: Pulmonary Disease

## 2023-12-02 VITALS — BP 122/84 | HR 86 | Temp 97.6°F | Ht 66.0 in | Wt 163.0 lb

## 2023-12-02 DIAGNOSIS — T451X5A Adverse effect of antineoplastic and immunosuppressive drugs, initial encounter: Secondary | ICD-10-CM | POA: Diagnosis not present

## 2023-12-02 DIAGNOSIS — Z170 Estrogen receptor positive status [ER+]: Secondary | ICD-10-CM

## 2023-12-02 DIAGNOSIS — J984 Other disorders of lung: Secondary | ICD-10-CM

## 2023-12-02 DIAGNOSIS — C50411 Malignant neoplasm of upper-outer quadrant of right female breast: Secondary | ICD-10-CM

## 2023-12-02 DIAGNOSIS — R0602 Shortness of breath: Secondary | ICD-10-CM | POA: Diagnosis not present

## 2023-12-02 NOTE — Progress Notes (Signed)
 Subjective:    Patient ID: [name], female    DOB: 10-15-1955, 68 y.o.   MRN: [id number]  Patient Care Team: Tisovec, [name], MD as PCP - General (Internal Medicine) [name], MD as PCP - Cardiology (Cardiology) [name], MD as Consulting Physician (Hematology) [name], MD as Consulting Physician (General Surgery) [name], MD as Consulting Physician (Radiation Oncology) [name], RN as Oncology Nurse Navigator [name], MD as Consulting Physician (Radiation Oncology) Tamea Dedra CROME, MD as Consulting Physician (Pulmonary Disease)  Chief Complaint  Patient presents with   Shortness of Breath    Occasional shortness of breath.     BACKGROUND/INTERVAL: Initially seen here for August 2025. Patient is a 68 year old who presents for evaluation of pulmonary infiltrates and shortness of breath on exertion in the setting of Herceptin  therapy for breast cancer.  Patient noted to have findings consistent with trastuzumab  (Herceptin ) pneumonitis.  She is completing steroid course.  HPI Discussed the use of AI scribe software for clinical note transcription with the patient, who gave verbal consent to proceed.  History of Present Illness   [name] is a 68 year old female with pneumonitis due to trastuzumab  who presents for follow-up.  She is currently being treated for pneumonitis and has a history of trastuzumab  therapy. She is on a tapering dose of prednisone , taking 5 mg daily, which she started on Wednesday and plans to continue until Tuesday.  She was previously on prophylactic Bactrim  due to the high dose of prednisone  but has since discontinued it as her prednisone  dose has decreased.  She is also on anastrozole  (Arimidex ) daily as part of her ongoing treatment regimen.  She has not had any difficulties with this medication.  She does not endorse any fevers, chills or sweats.  No cough or sputum production.  No  hemoptysis.  Overall she feels well and looks well.     Review of Systems A 10 point review of systems was performed and it is as noted above otherwise negative.   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   ferrous sulfate  325 (65 FE) MG tablet Take 325 mg by mouth daily.   Insulin  Glargine (BASAGLAR  KWIKPEN) 100 UNIT/ML SOPN Inject 35 Units into the skin at bedtime.   insulin  lispro (HUMALOG) 100 UNIT/ML injection Inject 10-15 Units into the skin 3 (three) times daily before meals. Sliding scale   levothyroxine  (SYNTHROID ) 112 MCG tablet Take 112 mcg by mouth daily before breakfast.   metFORMIN  (GLUCOPHAGE ) 1000 MG tablet Take 1,000 mg by mouth 2 (two) times daily with a meal.   metoprolol  succinate (TOPROL  XL) 50 MG 24  hr tablet Take 1 tablet (50 mg total) by mouth daily.   nitrofurantoin (MACRODANTIN) 100 MG capsule Take 100 mg by mouth daily.   Nutritional Supplements (JUICE PLUS FIBRE PO) Take 3 tablets by mouth daily. Juice Plus Vegetable   Nutritional Supplements (JUICE PLUS FIBRE PO) Take 3 tablets by mouth daily. Juice Plus Fruit   predniSONE  (DELTASONE ) 10 MG tablet Take 3 tablets (30 mg total) by mouth daily with  breakfast. Take for one week then decrease to 2 tab, 1 tab and 0.5 tab daily for week 2, 3 and 4   ramipril  (ALTACE ) 5 MG capsule Take 5 mg by mouth daily with breakfast.   rosuvastatin  (CRESTOR ) 20 MG tablet Take 20 mg by mouth daily with supper.    triamcinolone  cream (KENALOG ) 0.1 % Apply 1 Application topically daily as needed (dermatitis).    Immunization History  Administered Date(s) Administered   PFIZER(Purple Top)SARS-COV-2 Vaccination 05/10/2019, 06/06/2019        Objective:     BP 122/84   Pulse 86   Temp 97.6 F (36.4 C) (Temporal)   Ht 5' 6 (1.676 m)   Wt 163 lb (73.9 kg)   SpO2 99%   BMI 26.31 kg/m   SpO2: 99 %  GENERAL: Well-developed, well-nourished woman, no acute distress, fully ambulatory, no conversational dyspnea. HEAD: Normocephalic, atraumatic.  EYES: Pupils equal, round, reactive to light.  No scleral icterus.  MOUTH: Dentition intact, oral mucosa moist.  No thrush. NECK: Supple. No thyromegaly. Trachea midline. No JVD.  No adenopathy. PULMONARY: Good air entry bilaterally.  No adventitious sounds. CARDIOVASCULAR: S1 and S2. Regular rate and rhythm.  No rubs, murmurs or gallops heard. ABDOMEN: Benign. MUSCULOSKELETAL: No joint deformity, no clubbing, no edema.  NEUROLOGIC: No overt focal deficit, no gait disturbance, speech is fluent. SKIN: Intact,warm,dry. PSYCH: Mood and behavior normal.        Assessment & Plan:     ICD-10-CM   1. Pneumonitis  J98.4 Pulmonary function test    2. Adverse effect of trastuzumab  infusion  T45.1X5A     3. Malignant neoplasm of upper-outer quadrant of right breast in female, estrogen receptor positive (HCC)  C50.411    Z17.0     4. Shortness of breath  R06.02 Pulmonary function test      Orders Placed This Encounter  Procedures   Pulmonary function test    Standing Status:   Future    Expiration Date:   12/01/2024    Where should this test be performed?:   Outpatient Pulmonary    What type of PFT is  being ordered?:   Full PFT   Discussion:    Pneumonitis due to trastuzumab  Pneumonitis secondary to trastuzumab  treatment is improving significantly. Chest x-ray shows marked improvement with reduced fluffiness. Currently on a tapering dose of prednisone , now at 5 mg. No longer requires prophylactic Bactrim  as she is on a lower dose of prednisone . - Order pulmonary function tests to establish baseline lung function. - Discontinue prednisone  after current course if symptoms remain stable. - Discontinue prophylactic Bactrim . - Follow up in 4-6 weeks with pulmonary function tests.     Advised if symptoms do not improve or worsen, to please contact office for sooner follow up or seek emergency care.    I spent 33 minutes of dedicated to the care of this patient on the date of this encounter to include pre-visit review of records, face-to-face time with the patient discussing conditions above, post visit ordering of testing, clinical documentation  with the electronic health record, making appropriate referrals as documented, and communicating necessary findings to members of the patients care team.     C. Leita Sanders, MD Advanced Bronchoscopy PCC[facility]    *This note was generated using voice recognition software/Dragon and/or AI transcription program.  Despite best efforts to proofread, errors can occur which can change the meaning. Any transcriptional errors that result from this process are unintentional and may not be fully corrected at the time of dictation.

## 2023-12-02 NOTE — Patient Instructions (Signed)
 VISIT SUMMARY:  You had a follow-up appointment today to check on your pneumonitis, which is inflammation of the lung tissue, caused by your previous trastuzumab  treatment. Your condition is improving significantly.  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 Your chest x-ray shows marked improvement, and you are currently on a tapering dose of prednisone , now at 5 mg daily. You no longer need to take prophylactic Bactrim  as your prednisone  dose has decreased. We will order pulmonary function tests to establish your baseline lung function. If your symptoms remain stable, you can discontinue prednisone  after the current course. Please follow up in 4-6 weeks with the results of your pulmonary function tests.  INSTRUCTIONS:  Please follow up in 4-6 weeks with a pulmonary function test at that time.

## 2024-01-02 NOTE — Assessment & Plan Note (Signed)
 pT1aN0M0, stage IA, G2, HER2 positive, ER positive, PR negative, HER2+, and DCIS(+) It was discovered on screening mammogram, initial biopsy showed DCIS only.   -Status post a right lumpectomy.  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 Margins are negative for invasive cancer, but DCIS margin was close. No lymphovascular or perineural invasion. Lymph node biopsy was not performed, Dr. Ebbie has ordered an ultrasound of the right axilla for further evaluation.  If ultrasound is negative, I think it is okay not to have sentinel lymph node biopsy given the small primary tumor. -Given the aggressive nature of her HER2 positive disease, and moderate risk of recurrence, I do recommend adjuvant chemotherapy with Taxol  or Abraxane  once weekly for 12 weeks. -she underwent port placement and right axillary SLN biopsy on 01/12/23 and all 3 nodes were negative   -She started to weekly paclitaxel  and trastuzumab  on February 02, 2023, completed Taxol  on 1/213/2025, plan for adjuvant trastuzumab  for 1 year -she started adjuvant anastrozole  in late April 2025 - Trastuzumab  injection has been held since early June 2025, due to multiple UTI, bronchitis, and hospital admission for multilobar pneumonia in July 2025. Due to her CT findings and poor response to antibiotics, I suspect she has pneumonitis, possible from trastuzumab . I started her on high dose prednisone  on 10/25/2023 and tapered off in 8 weeks.

## 2024-01-03 ENCOUNTER — Inpatient Hospital Stay: Attending: Hematology

## 2024-01-03 ENCOUNTER — Inpatient Hospital Stay (HOSPITAL_BASED_OUTPATIENT_CLINIC_OR_DEPARTMENT_OTHER): Admitting: Hematology

## 2024-01-03 VITALS — BP 120/78 | HR 91 | Temp 98.2°F | Resp 17 | Ht 66.0 in | Wt 167.4 lb

## 2024-01-03 DIAGNOSIS — J984 Other disorders of lung: Secondary | ICD-10-CM | POA: Diagnosis not present

## 2024-01-03 DIAGNOSIS — Z79899 Other long term (current) drug therapy: Secondary | ICD-10-CM | POA: Insufficient documentation

## 2024-01-03 DIAGNOSIS — Z79811 Long term (current) use of aromatase inhibitors: Secondary | ICD-10-CM | POA: Diagnosis not present

## 2024-01-03 DIAGNOSIS — Z170 Estrogen receptor positive status [ER+]: Secondary | ICD-10-CM | POA: Diagnosis not present

## 2024-01-03 DIAGNOSIS — Z1731 Human epidermal growth factor receptor 2 positive status: Secondary | ICD-10-CM | POA: Insufficient documentation

## 2024-01-03 DIAGNOSIS — D649 Anemia, unspecified: Secondary | ICD-10-CM | POA: Insufficient documentation

## 2024-01-03 DIAGNOSIS — D0511 Intraductal carcinoma in situ of right breast: Secondary | ICD-10-CM

## 2024-01-03 DIAGNOSIS — Z1722 Progesterone receptor negative status: Secondary | ICD-10-CM | POA: Insufficient documentation

## 2024-01-03 DIAGNOSIS — C50411 Malignant neoplasm of upper-outer quadrant of right female breast: Secondary | ICD-10-CM | POA: Diagnosis present

## 2024-01-03 LAB — CMP (CANCER CENTER ONLY)
ALT: 7 U/L (ref 0–44)
AST: 8 U/L — ABNORMAL LOW (ref 15–41)
Albumin: 4.2 g/dL (ref 3.5–5.0)
Alkaline Phosphatase: 88 U/L (ref 38–126)
Anion gap: 8 (ref 5–15)
BUN: 8 mg/dL (ref 8–23)
CO2: 31 mmol/L (ref 22–32)
Calcium: 10.3 mg/dL (ref 8.9–10.3)
Chloride: 99 mmol/L (ref 98–111)
Creatinine: 0.52 mg/dL (ref 0.44–1.00)
GFR, Estimated: >60 mL/min (ref >60)
Glucose, Bld: 219 mg/dL — ABNORMAL HIGH (ref 70–99)
Potassium: 3.9 mmol/L (ref 3.5–5.1)
Sodium: 138 mmol/L (ref 135–145)
Total Bilirubin: 0.3 mg/dL (ref 0.0–1.2)
Total Protein: 7.4 g/dL (ref 6.5–8.1)

## 2024-01-03 LAB — CBC WITH DIFFERENTIAL (CANCER CENTER ONLY)
Abs Immature Granulocytes: 0.05 K/uL (ref 0.00–0.07)
Basophils Absolute: 0.1 K/uL (ref 0.0–0.1)
Basophils Relative: 2 %
Eosinophils Absolute: 0.4 K/uL (ref 0.0–0.5)
Eosinophils Relative: 6 %
HCT: 37.3 % (ref 36.0–46.0)
Hemoglobin: 12.6 g/dL (ref 12.0–15.0)
Immature Granulocytes: 1 %
Lymphocytes Relative: 17 %
Lymphs Abs: 1 K/uL (ref 0.7–4.0)
MCH: 27.2 pg (ref 26.0–34.0)
MCHC: 33.8 g/dL (ref 30.0–36.0)
MCV: 80.4 fL (ref 80.0–100.0)
Monocytes Absolute: 0.6 K/uL (ref 0.1–1.0)
Monocytes Relative: 10 %
Neutro Abs: 4 K/uL (ref 1.7–7.7)
Neutrophils Relative %: 64 %
Platelet Count: 431 K/uL — ABNORMAL HIGH (ref 150–400)
RBC: 4.64 MIL/uL (ref 3.87–5.11)
RDW: 17.6 % — ABNORMAL HIGH (ref 11.5–15.5)
WBC Count: 6.1 K/uL (ref 4.0–10.5)
nRBC: 0 % (ref 0.0–0.2)

## 2024-01-03 LAB — FERRITIN: Ferritin: 43 ng/mL (ref 11–307)

## 2024-01-03 MED ORDER — ANASTROZOLE 1 MG PO TABS: 1.0000 mg | ORAL_TABLET | Freq: Every day | ORAL | 3 refills | Status: AC

## 2024-01-03 NOTE — Progress Notes (Signed)
 [facility]   Telephone:([phone] Fax:([phone]) [phone]   Clinic Follow up Note   Patient Care Team: Tisovec, [name], MD as PCP - General (Internal Medicine) [name], MD as PCP - Cardiology (Cardiology) [name], MD as Consulting Physician (Hematology) [name], MD as Consulting Physician (General Surgery) [name], MD as Consulting Physician (Radiation Oncology) [name], RN as Oncology Nurse Navigator [name], MD as Consulting Physician (Radiation Oncology) Tamea Dedra CROME, MD as Consulting Physician (Pulmonary Disease)  Date of Service:  01/03/2024  CHIEF COMPLAINT: f/u of right breast cancer  CURRENT THERAPY:  Anastrozole   Oncology History   Malignant neoplasm of upper-outer quadrant of right breast in female, estrogen receptor positive (HCC) pT1aN0M0, stage IA, G2, HER2 positive, ER positive, PR negative, HER2+, and DCIS(+) It was discovered on screening mammogram, initial biopsy showed DCIS only.   -Status post a right lumpectomy.  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 Margins are negative for invasive cancer, but DCIS margin was close. No lymphovascular or perineural invasion. Lymph node biopsy was not performed, Dr. Ebbie has ordered an ultrasound of the right axilla for further evaluation.  If ultrasound is negative, I think it is okay not to have sentinel lymph node biopsy given the small primary tumor. -Given the aggressive nature of her HER2 positive disease, and moderate risk of recurrence, I do recommend adjuvant chemotherapy with Taxol  or Abraxane  once weekly for 12 weeks. -she underwent port placement and right axillary SLN biopsy on 01/12/23 and all 3 nodes were negative   -She started to weekly paclitaxel  and trastuzumab  on February 02, 2023, completed Taxol  on 1/213/2025, plan for adjuvant trastuzumab  for  1 year -she started adjuvant anastrozole  in late April 2025 - Trastuzumab  injection has been held since early June 2025, due to multiple UTI, bronchitis, and hospital admission for multilobar pneumonia in July 2025. Due to her CT findings and poor response to antibiotics, I suspect she has pneumonitis, possible from trastuzumab . I started her on high dose prednisone  on 10/25/2023 and tapered off in 8 weeks.   Assessment & Plan Malignant neoplasm of upper-outer quadrant of right breast Breast cancer in the upper-outer quadrant of the right breast. Currently on anastrozole , an aromatase inhibitor, which stops the production of estrogen. Trastuzumab  was discontinued. Anastrozole  is preferred over tamoxifen due to joint pain and bone health considerations. Tamoxifen has risks of endometrial cancer and blood clots. Recent mammogram in July was normal, and no further follow-up is needed for a year. 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 She completed a seven-week course of prednisone  in mid-September. A follow-up breathing test is scheduled for October 29th with Dr. Tamea. A CT scan is planned to be repeated before the pulmonary visit to assess for any residual changes. - Order CT scan to be done a week before her visit with the pulmonologist at the end of this month - Schedule breathing test on October 29th with Dr. Tamea  Anemia, improving Anemia is improving with blood counts nearly back to normal. She has been on iron supplementation for five months, taking it every other day. - Check iron levels - Continue current iron supplementation  regimen   Plan - She is clinically doing much better, pulse ox has been in  high 90s, she is back to normal activities now. - Will repeat a CT chest without contrast in 2 weeks, before her appointment with her pulmonologist - Continue anastrozole , I refilled for her - Lab and follow-up with NP in 6 months - She will check with her PCP about repeating bone density scan  SUMMARY OF ONCOLOGIC HISTORY: Oncology History  Malignant neoplasm of upper-outer quadrant of right breast in female, estrogen receptor positive (HCC)  10/27/2022 Cancer Staging   Staging form: Breast, AJCC 8th Edition - Clinical stage from 10/27/2022: Stage 0 (cTis (DCIS), cN0, cM0, G3, ER+, PR-, HER2: Not Assessed) - Signed by [name], MD on 11/03/2022 Stage prefix: Initial diagnosis Histologic grading system: 3 grade system   11/01/2022 Initial Diagnosis   Ductal carcinoma in situ (DCIS) of right breast   11/09/2022 Genetic Testing   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  No other deleterious variants in [facility] Common Hereditary Cancers +RNA Panel. Report date is 11/09/2022.    The [facility] Common Hereditary Cancers + RNA Panel includes sequencing, deletion/duplication, and RNA analysis of the following 48 genes: APC, ATM, AXIN2, BAP1, BARD1, BMPR1A, BRCA1, BRCA2, BRIP1, CDH1, CDK4*, CDKN2A*, CHEK2, CTNNA1, DICER1, EPCAM* (del/dup only), FH, GREM1* (promoter dup analysis only), HOXB13*, KIT*, MBD4*, MEN1, MLH1, MSH2, MSH3, MSH6, MUTYH, NF1, NTHL1, PALB2, PDGFRA*, PMS2, POLD1, POLE, PTEN, RAD51C, RAD51D, SDHA (sequencing only), SDHB, SDHC, SDHD, SMAD4, SMARCA4, STK11, TP53, TSC1, TSC2, VHL.  *Genes without RNA analysis.    12/07/2022 Cancer Staging   Staging form: Breast, AJCC 8th Edition - Pathologic stage from 12/07/2022: Stage IA (pT1a, pN0, cM0, G2, ER+, PR-, HER2+) - Signed by [name], MD on 12/20/2022 Stage prefix: Initial diagnosis Histologic grading system: 3 grade system Residual tumor (R): R0 - None   02/02/2023 - 08/30/2023 Chemotherapy   Patient is on Treatment  Plan : BREAST Abraxane  + Trastuzumab  q7d / Trastuzumab  q21d        Discussed the use of AI scribe software for clinical note transcription with the patient, who gave verbal consent to proceed.  History of Present Illness [name] is a 68 year old female with breast cancer who presents for follow-up.  She is currently on anastrozole  for breast cancer treatment, having previously been on trastuzumab , which was discontinued. Her breathing has improved significantly since stopping prednisone  in mid-September, with oxygen levels now stable in the upper nineties. She is scheduled for a pulmonary function test on October 29th. Her activity level has returned to near normal, and she plans to gradually restart exercise as her oxygen levels have improved.  She has a history of anemia and elevated platelet count, both of which have improved. She has been taking iron supplements for five months, approximately every other day, and is awaiting current iron level results.  Her last mammogram in July was normal, although it initially required an ultrasound for further evaluation.     All other systems were reviewed with the patient and are negative.  MEDICAL HISTORY:  Past Medical History:  Diagnosis Date   Anemia    Arthritis    Atypical chest pain    a. 05/2018 CTA chest: No PE; b. 05/2018 MV: No ischemia/scar. EF 65%.   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  05/2018 CT Chest: Coronary and Ao atherosclerotic Calcifications.   Diabetes mellitus without complication (HCC)    Diastolic dysfunction    a. 05/2018 Echo: EF 60-65%, impaired relaxation. Nl RVSP.   Dysrhythmia    PVC's   Family history of adverse reaction to anesthesia    sister-malignant hyperthermia   FH of Malignant hyperthermia    a. sister had malginant hyperthermia   History of kidney stones    History of renal  insufficiency    History of skin cancer    Hyperlipemia    Osteoporosis    Pulmonary nodules    a. 05/2018 CT Chest: Small pulm nodules measuring up to 6mm avg diameter. Rec non-contrast Chest CT in 6-12 mos.   Thyroid  nodule     SURGICAL HISTORY: Past Surgical History:  Procedure Laterality Date   AXILLARY LYMPH NODE BIOPSY Right 01/12/2023   Procedure: RIGHT AXILLARY SENTINEL NODE BIOPSY;  Surgeon: [name], MD;  Location: [facility] OR;  Service: General;  Laterality: Right;  LMA   BREAST BIOPSY Right 10/27/2022   MM RT BREAST BX W LOC DEV 1ST LESION IMAGE BX SPEC STEREO GUIDE 10/27/2022 [facility]   BREAST BIOPSY  12/06/2022   MM RT RADIOACTIVE SEED LOC MAMMO GUIDE 12/06/2022 [facility]   BREAST LUMPECTOMY WITH RADIOACTIVE SEED LOCALIZATION Right 12/07/2022   Procedure: RIGHT BREAST LUMPECTOMY WITH RADIOACTIVE SEED LOCALIZATION;  Surgeon: [name], MD;  Location: [facility] OR;  Service: General;  Laterality: Right;   CESAREAN SECTION     CHOLECYSTECTOMY  2006   lap choli with umb hernia   COLONOSCOPY     HYSTEROSCOPY WITH D & C N/A 12/07/2022   Procedure: DILATATION AND CURETTAGE /HYSTEROSCOPY WITH POLYPECTOMY ENDOMETRIAL BIOPSY;  Surgeon: Okey Leader, MD;  Location: [facility] OR;  Service: Gynecology;  Laterality: N/A;   LIPOMA EXCISION Left 03/15/2014   Procedure: EXCISION LIPOMA LEFT LOWER QUARDRANT ABDOMINAL WALL;  Surgeon: Dann Hummer, MD;  Location: [facility];  Service: General;  Laterality: Left;   PORTACATH PLACEMENT N/A 01/12/2023   Procedure: PORT PLACEMENT WITH ULTRASOUND GUIDANCE;  Surgeon: [name], MD;  Location: [facility] OR;  Service: General;  Laterality: N/A;   THYROIDECTOMY N/A 06/25/2019   Procedure: TOTAL THYROIDECTOMY;  Surgeon: Eletha Boas, MD;  Location: [facility];  Service: General;  Laterality: N/A;   TONSILLECTOMY     TRIGGER FINGER RELEASE  2012   lt small,middle,long   TRIGGER FINGER RELEASE Right 07/14/2017   Procedure:  RELEASE TRIGGER FINGER/A-1 PULLEY RIGHT THUMB;  Surgeon: Murrell Kuba, MD;  Location: [facility];  Service: Orthopedics;  Laterality: Right;   UMBILICAL HERNIA REPAIR  2006   with lap choli    I have reviewed the social history and family history with the patient and they are unchanged from previous note.  ALLERGIES:  has no known allergies.  MEDICATIONS:  Current Outpatient Medications  Medication Sig Dispense Refill   anastrozole  (ARIMIDEX ) 1 MG tablet Take 1 tablet (1 mg total) by mouth daily. 90 tablet 3   aspirin  EC 81 MG tablet Take 81 mg by mouth daily.     ferrous sulfate  325 (65 FE) MG tablet Take 325 mg by mouth daily.     Insulin  Glargine (BASAGLAR  KWIKPEN) 100 UNIT/ML SOPN Inject 35 Units into the skin at bedtime.     insulin  lispro (HUMALOG) 100 UNIT/ML injection Inject 10-15 Units into the skin 3 (three) times daily before meals. Sliding scale     levothyroxine  (SYNTHROID ) 112 MCG tablet  Take 112 mcg by mouth daily before breakfast.     metFORMIN  (GLUCOPHAGE ) 1000 MG tablet Take 1,000 mg by mouth 2 (two) times daily with a meal.     metoprolol  succinate (TOPROL  XL) 50 MG 24 hr tablet Take 1 tablet (50 mg total) by mouth daily. 90 tablet 3   nitrofurantoin (MACRODANTIN) 100 MG capsule Take 100 mg by mouth daily.     Nutritional Supplements (JUICE PLUS FIBRE PO) Take 3 tablets by mouth daily. Juice Plus Vegetable     Nutritional Supplements (JUICE PLUS FIBRE PO) Take 3 tablets by mouth daily. Juice Plus Fruit     ramipril  (ALTACE ) 5 MG capsule Take 5 mg by mouth daily with breakfast.     rosuvastatin  (CRESTOR ) 20 MG tablet Take 20 mg by mouth daily with supper.      triamcinolone  cream (KENALOG ) 0.1 % Apply 1 Application topically daily as needed (dermatitis).     No current facility-administered medications for this visit.    PHYSICAL EXAMINATION: ECOG PERFORMANCE STATUS: 1 - Symptomatic but completely ambulatory  Vitals:   01/03/24 0825  BP: 120/78   Pulse: 91  Resp: 17  Temp: 98.2 F (36.8 C)  SpO2: 99%   Wt Readings from Last 3 Encounters:  01/03/24 167 lb 6.4 oz (75.9 kg)  12/02/23 163 lb (73.9 kg)  11/08/23 161 lb 1.6 oz (73.1 kg)     GENERAL:alert, no distress and comfortable SKIN: skin color, texture, turgor are normal, no rashes or significant lesions EYES: normal, Conjunctiva are pink and non-injected, sclera clear NECK: supple, thyroid  normal size, non-tender, without nodularity LYMPH:  no palpable lymphadenopathy in the cervical, axillary  LUNGS: clear to auscultation and percussion with normal breathing effort HEART: regular rate & rhythm and no murmurs and no lower extremity edema ABDOMEN:abdomen soft, non-tender and normal bowel sounds Musculoskeletal:no cyanosis of digits and no clubbing  NEURO: alert & oriented x 3 with fluent speech, no focal motor/sensory deficits Breasts: Breast inspection showed them to be symmetrical with no nipple discharge. Palpation of the breasts and axilla revealed no obvious mass that I could appreciate except scar tissue underneath the incision in the right upper quadrant of breast.  No axillary adenopathy.  Physical Exam   LABORATORY DATA:  I have reviewed the data as listed    Latest Ref Rng & Units 01/03/2024    8:03 AM 11/08/2023   10:39 AM 10/15/2023    4:32 AM  CBC  WBC 4.0 - 10.5 K/uL 6.1  12.6  9.9   Hemoglobin 12.0 - 15.0 g/dL 87.3  87.2  9.9   Hematocrit 36.0 - 46.0 % 37.3  38.7  29.4   Platelets 150 - 400 K/uL 431  421  663         Latest Ref Rng & Units 01/03/2024    8:03 AM 11/08/2023   10:39 AM 10/15/2023    4:32 AM  CMP  Glucose 70 - 99 mg/dL 780  50  740   BUN 8 - 23 mg/dL 8  8  7    Creatinine 0.44 - 1.00 mg/dL 9.47  9.43  9.33   Sodium 135 - 145 mmol/L 138  134  131   Potassium 3.5 - 5.1 mmol/L 3.9  4.2  3.4   Chloride 98 - 111 mmol/L 99  96  93   CO2 22 - 32 mmol/L 31  31  27    Calcium  8.9 - 10.3 mg/dL 89.6  9.8  8.4   Total Protein 6.5 -  8.1 g/dL  7.4  7.0    Total Bilirubin 0.0 - 1.2 mg/dL 0.3  0.5    Alkaline Phos 38 - 126 U/L 88  66    AST 15 - 41 U/L 8  9    ALT 0 - 44 U/L 7  9        RADIOGRAPHIC STUDIES: I have personally reviewed the radiological images as listed and agreed with the findings in the report. No results found.    Orders Placed This Encounter  Procedures   CT Chest Wo Contrast    Standing Status:   Future    Expected Date:   01/17/2024    Expiration Date:   01/02/2025    Preferred imaging location?:   [facility]   All questions were answered. The patient knows to call the clinic with any problems, questions or concerns. No barriers to learning was detected. The total time spent in the appointment was 25 minutes, including review of chart and various tests results, discussions about plan of care and coordination of care plan     Patricia Mattock, MD 01/03/2024

## 2024-01-04 ENCOUNTER — Encounter: Payer: Self-pay | Admitting: Hematology

## 2024-01-12 ENCOUNTER — Encounter: Payer: Self-pay | Admitting: Emergency Medicine

## 2024-01-12 ENCOUNTER — Ambulatory Visit
Admission: EM | Admit: 2024-01-12 | Discharge: 2024-01-12 | Disposition: A | Discharge status: Home / Self Care | Attending: Emergency Medicine | Admitting: Emergency Medicine

## 2024-01-12 DIAGNOSIS — R319 Hematuria, unspecified: Secondary | ICD-10-CM | POA: Diagnosis present

## 2024-01-12 LAB — POCT URINE DIPSTICK
Bilirubin, UA: NEGATIVE
Glucose, UA: NEGATIVE mg/dL
Ketones, POC UA: NEGATIVE mg/dL
Nitrite, UA: NEGATIVE
POC PROTEIN,UA: NEGATIVE
Spec Grav, UA: 1.01 (ref 1.010–1.025)
Urobilinogen, UA: 0.2 U/dL
pH, UA: 6 (ref 5.0–8.0)

## 2024-01-12 MED ORDER — CEPHALEXIN 500 MG PO CAPS: 500.0000 mg | ORAL_CAPSULE | Freq: Three times a day (TID) | ORAL | 0 refills | Status: DC

## 2024-01-12 MED ORDER — NITROFURANTOIN MONOHYD MACRO 100 MG PO CAPS: 100.0000 mg | ORAL_CAPSULE | Freq: Two times a day (BID) | ORAL | 0 refills | Status: DC

## 2024-01-12 NOTE — ED Provider Notes (Signed)
 CAY RALPH PELT    CSN: [id number] Arrival date & time: 01/12/24  1233      History   Chief Complaint Chief Complaint  Patient presents with   Urinary Frequency   Hematuria   Dysuria    HPI [name] is a 68 y.o. female.   Patient's presents for evaluation of urinary frequency, dysuria and hematuria beginning 1 day ago.  History of reoccurring UTI, taking daily Macrodantin.  Endorses typically E. coli present within the urine and Cipro  is ineffective for treatment.  Denies abdominal or flank pain, fever or vaginal symptoms.  Has attempted use of Azo.   Past Medical History:  Diagnosis Date   Anemia    Arthritis    Atypical chest pain    a. 05/2018 CTA chest: No PE; b. 05/2018 MV: No ischemia/scar. EF 65%.   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 05/2018 CT Chest: Coronary and Ao atherosclerotic Calcifications.   Diabetes mellitus without complication (HCC)    Diastolic dysfunction    a. 05/2018 Echo: EF 60-65%, impaired relaxation. Nl RVSP.   Dysrhythmia    PVC's   Family history of adverse reaction to anesthesia    sister-malignant hyperthermia   FH of Malignant hyperthermia    a. sister had malginant hyperthermia   History of kidney stones    History of renal insufficiency    History of skin cancer    Hyperlipemia    Osteoporosis    Pulmonary nodules    a. 05/2018 CT Chest: Small pulm nodules measuring up to 6mm avg diameter. Rec non-contrast Chest CT in 6-12 mos.   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   Multiple thyroid  nodules 06/19/2019   Unstable angina (HCC) 06/15/2018   Chest pain    Plantar fibromatosis 09/09/2015    Past Surgical History:  Procedure Laterality Date   AXILLARY LYMPH NODE BIOPSY Right 01/12/2023   Procedure: RIGHT AXILLARY SENTINEL NODE BIOPSY;  Surgeon: [name], MD;  Location: [facility] OR;  Service: General;  Laterality: Right;  LMA   BREAST BIOPSY Right 10/27/2022   MM RT BREAST BX W LOC DEV 1ST LESION IMAGE BX SPEC STEREO GUIDE 10/27/2022 [facility]   BREAST BIOPSY  12/06/2022   MM RT RADIOACTIVE SEED LOC MAMMO GUIDE 12/06/2022 [facility]   BREAST LUMPECTOMY WITH RADIOACTIVE SEED LOCALIZATION Right 12/07/2022   Procedure: RIGHT BREAST LUMPECTOMY WITH RADIOACTIVE SEED LOCALIZATION;  Surgeon: [name], MD;  Location: [facility] OR;  Service: General;  Laterality: Right;   CESAREAN SECTION     CHOLECYSTECTOMY  2006   lap choli with umb hernia   COLONOSCOPY     HYSTEROSCOPY WITH D & C N/A 12/07/2022   Procedure: DILATATION AND CURETTAGE /HYSTEROSCOPY WITH POLYPECTOMY ENDOMETRIAL BIOPSY;  Surgeon: Okey Leader, MD;  Location: [facility] OR;  Service: Gynecology;  Laterality: N/A;   LIPOMA EXCISION Left 03/15/2014   Procedure: EXCISION LIPOMA LEFT LOWER QUARDRANT ABDOMINAL WALL;  Surgeon: Dann Hummer, MD;  Location: [facility];  Service: General;  Laterality: Left;   PORTACATH PLACEMENT N/A 01/12/2023   Procedure: PORT PLACEMENT WITH ULTRASOUND GUIDANCE;  Surgeon: [name], MD;  Location: [facility] OR;  Service: General;  Laterality: N/A;   THYROIDECTOMY N/A 06/25/2019   Procedure: TOTAL THYROIDECTOMY;  Surgeon: Eletha Boas, MD;  Location: [facility];  Service: General;  Laterality: N/A;   TONSILLECTOMY     TRIGGER FINGER RELEASE  2012   lt small,middle,long   TRIGGER FINGER RELEASE Right 07/14/2017   Procedure: RELEASE TRIGGER FINGER/A-1 PULLEY RIGHT THUMB;  Surgeon: Murrell Kuba, MD;  Location: [facility];   Service: Orthopedics;  Laterality: Right;   UMBILICAL HERNIA REPAIR  2006   with lap choli    OB History   No obstetric history on file.      Home Medications    Prior to Admission medications   Medication Sig Start Date End Date Taking? Authorizing Provider  cephALEXin (KEFLEX) 500 MG capsule Take 1 capsule (500 mg total) by mouth 3 (three) times daily for 5 days. 01/12/24 01/17/24 Yes Clayborne Divis R, NP  anastrozole  (ARIMIDEX ) 1 MG tablet Take 1 tablet (1 mg total) by mouth daily. 01/03/24   [name], MD  aspirin  EC 81 MG tablet Take 81 mg by mouth daily.    [provider]  ferrous sulfate  325 (65 FE) MG tablet Take 325 mg by mouth daily. 03/31/21   [provider]  Insulin  Glargine (BASAGLAR  KWIKPEN) 100 UNIT/ML SOPN Inject 35 Units into the skin at bedtime. 11/26/16   [provider]  insulin  lispro (HUMALOG) 100 UNIT/ML injection Inject 10-15 Units into the skin 3 (three) times daily before meals. Sliding scale    [provider]  levothyroxine  (SYNTHROID ) 112 MCG tablet Take 112 mcg by mouth daily before breakfast.    [provider]  metFORMIN  (GLUCOPHAGE ) 1000 MG tablet Take 1,000 mg by mouth 2 (two) times daily with a meal.    [provider]  metoprolol  succinate (TOPROL  XL) 50 MG 24 hr tablet Take 1 tablet (50 mg total) by mouth daily. 10/31/23 10/30/24  Zenaida Morene PARAS, MD  nitrofurantoin (MACRODANTIN) 100 MG capsule Take 100 mg by mouth daily.    [provider]  Nutritional Supplements (JUICE PLUS FIBRE PO) Take 3 tablets by mouth daily. Juice Plus Vegetable    [provider]  Nutritional Supplements (JUICE PLUS FIBRE PO) Take 3 tablets by mouth daily. Juice Plus Fruit    [provider]  ramipril  (ALTACE ) 5 MG capsule Take 5 mg by mouth daily with breakfast.    [provider]  rosuvastatin  (CRESTOR ) 20 MG tablet Take 20 mg by mouth daily with supper.     [provider]   triamcinolone  cream (KENALOG ) 0.1 % Apply 1 Application topically daily as needed (dermatitis).    [provider]    Family History Family History  Problem Relation Age of Onset   Heart disease Mother    Throat cancer Mother    Diabetes Father    Breast cancer Sister 61       d. 74; mets   Breast cancer Sister 46       d. 77s; mets   Heart disease Other     Social History Social History   Tobacco Use   Smoking status: Never   Smokeless tobacco: Never  Substance Use Topics   Alcohol use: No   Drug use: No     Allergies   Patient has no known allergies.  Review of Systems Review of Systems   Physical Exam Triage Vital Signs ED Triage Vitals  Encounter Vitals Group     BP 01/12/24 1244 117/76     Girls Systolic BP Percentile --      Girls Diastolic BP Percentile --      Boys Systolic BP Percentile --      Boys Diastolic BP Percentile --      Pulse Rate 01/12/24 1244 93     Resp 01/12/24 1244 18     Temp 01/12/24 1244 98 F (36.7 C)     Temp Source 01/12/24 1244 Oral     SpO2 01/12/24 1244 99 %     Weight --      Height --      Head Circumference --      Peak Flow --      Pain Score 01/12/24 1242 0     Pain Loc --      Pain Education --      Exclude from Growth Chart --    No data found.  Updated Vital Signs BP 117/76 (BP Location: Left Arm)   Pulse 93   Temp 98 F (36.7 C) (Oral)   Resp 18   SpO2 99%   Visual Acuity Right Eye Distance:   Left Eye Distance:   Bilateral Distance:    Right Eye Near:   Left Eye Near:    Bilateral Near:     Physical Exam Constitutional:      Appearance: Normal appearance.  Eyes:     Extraocular Movements: Extraocular movements intact.  Pulmonary:     Effort: Pulmonary effort is normal.  Abdominal:     Tenderness: There is no abdominal tenderness. There is no right CVA tenderness, left CVA tenderness or guarding.  Neurological:     Mental Status: She is alert and oriented to person, place,  and time. Mental status is at baseline.      UC Treatments / Results  Labs (all labs ordered are listed, but only abnormal results are displayed) Labs Reviewed  POCT URINE DIPSTICK - Abnormal; Notable for the following components:      Result Value   Clarity, UA cloudy (*)    Blood, UA trace-intact (*)    Leukocytes, UA Moderate (2+) (*)    All other components within normal limits  URINE CULTURE    EKG   Radiology No results found.  Procedures Procedures (including critical care time)  Medications Ordered in UC Medications - No data to display  Initial Impression / Assessment and Plan / UC Course  I have reviewed the triage vital signs and the nursing notes.  Pertinent labs & imaging results that were available during my care of the patient were reviewed by me and considered in my medical decision making (see chart for details).  Hematuria  Urinalysis showing leukocytes and hemoglobin, negative for nitrates, sent for culture, history of recurrent UTIs and she is symptomatic empirically placing on antibiotics, per chart review prior urine culture showing Klebsiella pneumoniae patient endorses history of E. coli therefore treating with cephalexin, discussed administration, recommended over-the-counter medications and nonpharmacological supportive care with follow-up as Final Clinical Impressions(s) / UC Diagnoses   Final diagnoses:  Hematuria, unspecified type     Discharge Instructions      Your urinalysis shows Erric Machnik blood cells but at the moment does not show bacteria, your urine will be sent to the lab to determine exactly which bacteria is present, if any changes need  to be made to your medications you will be notified  Begin use of cephalexin every 8 hours for 5 days for treatment as you are currently taking daily nitrofurantoin which is a version of Macrobid  You may use over-the-counter Azo to help minimize your symptoms until antibiotic removes bacteria,  this medication will turn your urine orange  Increase your fluid intake through use of water  As always practice good hygiene, wiping front to back and avoidance of scented vaginal products to prevent further irritation  If symptoms continue to persist after use of medication or recur please follow-up with urgent care or your primary doctor as needed    ED Prescriptions     Medication Sig Dispense Auth. Provider   nitrofurantoin, macrocrystal-monohydrate, (MACROBID) 100 MG capsule  (Status: Discontinued) Take 1 capsule (100 mg total) by mouth 2 (two) times daily. 10 capsule Everlean Bucher R, NP   cephALEXin (KEFLEX) 500 MG capsule Take 1 capsule (500 mg total) by mouth 3 (three) times daily for 5 days. 15 capsule Kionna Brier R, NP      PDMP not reviewed this encounter.   Teresa Shelba SAUNDERS, NP 01/12/24 1301

## 2024-01-12 NOTE — Discharge Instructions (Signed)
 Your urinalysis shows [name] blood cells but at the moment does not show bacteria, your urine will be sent to the lab to determine exactly which bacteria is present, if any changes need to be made to your medications you will be notified  Begin use of cephalexin every 8 hours for 5 days for treatment as you are currently taking daily nitrofurantoin which is a version of Macrobid  You may use over-the-counter Azo to help minimize your symptoms until antibiotic removes bacteria, this medication will turn your urine orange  Increase your fluid intake through use of water  As always practice good hygiene, wiping front to back and avoidance of scented vaginal products to prevent further irritation  If symptoms continue to persist after use of medication or recur please follow-up with urgent care or your primary doctor as needed

## 2024-01-12 NOTE — ED Triage Notes (Signed)
 Patient complains of urinary frequency, blood in urine and painful urination. Patient took AZO with mild relief.

## 2024-01-14 LAB — URINE CULTURE: Culture: 20000 — AB

## 2024-01-16 ENCOUNTER — Ambulatory Visit (HOSPITAL_COMMUNITY): Payer: Self-pay

## 2024-01-16 ENCOUNTER — Ambulatory Visit
Admission: RE | Admit: 2024-01-16 | Discharge: 2024-01-16 | Disposition: A | Discharge status: Home / Self Care | Source: Ambulatory Visit | Attending: Hematology | Admitting: Hematology

## 2024-01-16 DIAGNOSIS — C50411 Malignant neoplasm of upper-outer quadrant of right female breast: Secondary | ICD-10-CM | POA: Insufficient documentation

## 2024-01-16 DIAGNOSIS — Z170 Estrogen receptor positive status [ER+]: Secondary | ICD-10-CM | POA: Diagnosis present

## 2024-01-16 MED ORDER — CEFIXIME 400 MG PO CAPS: 400.0000 mg | ORAL_CAPSULE | Freq: Every day | ORAL | 0 refills | Status: AC

## 2024-01-16 NOTE — Telephone Encounter (Signed)
 Please remind patient that Cipro  is not prescribed to treat patients, it is prescribed to kill bacteria.  Because patient is over the age of 25, Cipro  is contraindicated anyway.  Recommend continue Macrodantin daily, discontinue Keflex and begin Suprax 1 capsule daily for 7 days in addition to daily Macrodantin.  Prescription sent to pharmacy on record.

## 2024-01-19 ENCOUNTER — Ambulatory Visit (HOSPITAL_COMMUNITY)
Admission: RE | Admit: 2024-01-19 | Discharge: 2024-01-19 | Disposition: A | Discharge status: Home / Self Care | Source: Ambulatory Visit | Attending: Cardiology | Admitting: Cardiology

## 2024-01-19 VITALS — BP 136/78 | HR 86 | Wt 167.0 lb

## 2024-01-19 DIAGNOSIS — I2510 Atherosclerotic heart disease of native coronary artery without angina pectoris: Secondary | ICD-10-CM | POA: Diagnosis not present

## 2024-01-19 DIAGNOSIS — C50411 Malignant neoplasm of upper-outer quadrant of right female breast: Secondary | ICD-10-CM | POA: Insufficient documentation

## 2024-01-19 DIAGNOSIS — J189 Pneumonia, unspecified organism: Secondary | ICD-10-CM | POA: Diagnosis not present

## 2024-01-19 DIAGNOSIS — Z79899 Other long term (current) drug therapy: Secondary | ICD-10-CM | POA: Insufficient documentation

## 2024-01-19 DIAGNOSIS — Z170 Estrogen receptor positive status [ER+]: Secondary | ICD-10-CM | POA: Insufficient documentation

## 2024-01-19 DIAGNOSIS — Z7982 Long term (current) use of aspirin: Secondary | ICD-10-CM | POA: Diagnosis not present

## 2024-01-19 MED ORDER — RAMIPRIL 10 MG PO CAPS: 10.0000 mg | ORAL_CAPSULE | Freq: Every day | ORAL | 3 refills | Status: AC

## 2024-01-19 NOTE — Patient Instructions (Addendum)
 INCREASE Ramipril  to 10 mg daily.  Your physician has requested that you have an echocardiogram. Echocardiography is a painless test that uses sound waves to create images of your heart. It provides your doctor with information about the size and shape of your heart and how well your heart's chambers and valves are working. This procedure takes approximately one hour. There are no restrictions for this procedure. Please do NOT wear cologne, perfume, aftershave, or lotions (deodorant is allowed). Please arrive 15 minutes prior to your appointment time.  Please note: We ask at that you not bring children with you during ultrasound (echo/ vascular) testing. Due to room size and safety concerns, children are not allowed in the ultrasound rooms during exams. Our front office staff cannot provide observation of children in our lobby area while testing is being conducted. An adult accompanying a patient to their appointment will only be allowed in the ultrasound room at the discretion of the ultrasound technician under special circumstances. We apologize for any inconvenience.  Your physician recommends that you schedule a follow-up appointment in: 4 months with echocardiogram at [facility].  If you have any questions or concerns before your next appointment please send us  a message through [place] or call our office at [phone].    TO LEAVE A MESSAGE FOR THE NURSE SELECT OPTION 2, PLEASE LEAVE A MESSAGE INCLUDING: YOUR NAME DATE OF BIRTH CALL BACK NUMBER REASON FOR CALL**this is important as we prioritize the call backs  YOU WILL RECEIVE A CALL BACK THE SAME DAY AS LONG AS YOU CALL BEFORE 4:00 PM  At the [facility], you and your health needs are our priority. As part of our continuing mission to provide you with exceptional heart care, we have created designated Provider Care Teams. These Care Teams include your primary Cardiologist (physician) and Advanced Practice Providers  (APPs- Physician Assistants and Nurse Practitioners) who all work together to provide you with the care you need, when you need it.   You may see any of the following providers on your designated Care Team at your next follow up: Dr Toribio Fuel Dr Ezra Shuck Dr. Ria Commander Dr. Morene Brownie Amy Lenetta, NP Caffie Shed, [place] [facility] [place], [place] Beckey Coe, NP [place] Lee, NP Ellouise Class, NP Tinnie Redman, PharmD Jaun Bash, PharmD   Please be sure to bring in all your medications bottles to every appointment.    Thank you for choosing [facility]

## 2024-01-19 NOTE — Progress Notes (Signed)
 Cardio-Oncology Clinic Consult Note   Referring Physician: Dr. Lanny Primary Care: Primary Cardiologist:  HPI:  [name] is a 68 y.o. female with past medical history of breast cancer on therapy who has been referred by Dr. Lanny to establish in the cardio-oncology clinic for monitoring of cardio-toxicity while undergoing chemotherapy.        Oncology History  Malignant neoplasm of upper-outer quadrant of right breast in female, estrogen receptor positive (HCC)  10/27/2022 Cancer Staging   Staging form: Breast, AJCC 8th Edition - Clinical stage from 10/27/2022: Stage 0 (cTis (DCIS), cN0, cM0, G3, ER+, PR-, HER2: Not Assessed) - Signed by [name], MD on 11/03/2022 Stage prefix: Initial diagnosis Histologic grading system: 3 grade system   11/01/2022 Initial Diagnosis   Ductal carcinoma in situ (DCIS) of right breast   11/09/2022 Genetic Testing   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  No other deleterious variants in [facility] Common Hereditary Cancers +RNA Panel. Report date is 11/09/2022.    The [facility] Common Hereditary Cancers + RNA Panel includes sequencing, deletion/duplication, and RNA analysis of the following 48 genes: APC, ATM, AXIN2, BAP1, BARD1, BMPR1A, BRCA1, BRCA2, BRIP1, CDH1, CDK4*, CDKN2A*, CHEK2, CTNNA1, DICER1, EPCAM* (del/dup only), FH, GREM1* (promoter dup analysis only), HOXB13*, KIT*, MBD4*, MEN1, MLH1, MSH2, MSH3, MSH6, MUTYH, NF1, NTHL1, PALB2, PDGFRA*, PMS2, POLD1, POLE, PTEN, RAD51C, RAD51D, SDHA (sequencing only), SDHB, SDHC, SDHD, SMAD4, SMARCA4, STK11, TP53, TSC1, TSC2, VHL.  *Genes without RNA analysis.    12/07/2022 Cancer Staging   Staging form: Breast, AJCC 8th Edition - Pathologic stage from 12/07/2022: Stage IA (pT1a, pN0, cM0, G2, ER+, PR-, HER2+) - Signed by [name], MD on 12/20/2022 Stage prefix: Initial diagnosis Histologic grading system: 3 grade system Residual tumor (R): R0 - None   02/02/2023 - 08/30/2023  Chemotherapy   Patient is on Treatment Plan : BREAST Abraxane  + Trastuzumab  q7d / Trastuzumab  q21d          Feeling much better since her last visit, her steroids have been tapered off and she feels her breathing is back to normal. She is not planning to go back on trastuzumab  so there is less urgency to repeat echocardiogram, but discussed that we may need to be more aggressive with therapy if still reduced. Otherwise no heart failure symptoms.    Medical history and current medications were reviewed in Epic as part of clinic visit.   PHYSICAL EXAM: Vitals:   01/19/24 1115  BP: 136/78  Pulse: 86  SpO2: 97%   GENERAL: Well nourished and in no apparent distress at rest.  HEENT: The mucous membranes are pink and moist.   PULM: Mild diffuse rhonchi CARDIAC:  JVP: Flat         Normal rate with regular rhythm. No murmurs, rubs or gallops.  Trace edema.  ABDOMEN: Soft, non-tender, non-distended. NEUROLOGIC: Patient is oriented x3 with no focal or lateralizing neurologic deficits.  PSYCH: Patients affect is appropriate, there is no evidence of anxiety or depression.  SKIN: Warm and dry; no lesions or wounds. Warm and well perfused extremities.    ASSESSMENT & PLAN:  Cardiotoxicity from trastuzumab : Patient with echocardiogram showing mildly reduced ejection fraction, with continued drop to now 40 to 45% while on therapy. Trastuzumab  held given pulmonary pneumonitis, patient now with NYHA class I symptoms, primary issue appears to be pulmonary. Doing well on medical therapy, will increase given mildly elevated BP.  - No plan to restart trastuzumab  given reaction - Continue metoprolol  succinate  50mg  daily - Increase ramipril  to 10mg  daily - Euvolemic, no need for diuretics - Recommendation to hold therapy at this time - Repeat echo ordered for next visit  Pneumonitis: Follows with pulmonary, significant improvement with steroid taper.  - Off therapy at this time  Coronary artery  calcifications: Noted on prior CT scans. - Continue aspirin  81 mg daily, rosuvastatin  20 mg daily  Explained incidence of trastuzumab  cardiotoxicity and role of Cardio-oncology clinic at length. Echo images reviewed personally.  Mild to moderately reduced ejection fraction. Not planning on restarting therapy.  Reviewed signs and symptoms of HF to look for.    Morene Brownie, MD [facility] Mechanical Circulatory Support Cardio-Oncology 01/19/24

## 2024-01-25 ENCOUNTER — Ambulatory Visit

## 2024-01-25 ENCOUNTER — Ambulatory Visit: Admitting: Pulmonary Disease

## 2024-01-25 DIAGNOSIS — J984 Other disorders of lung: Secondary | ICD-10-CM

## 2024-01-25 DIAGNOSIS — R0602 Shortness of breath: Secondary | ICD-10-CM

## 2024-01-25 LAB — PULMONARY FUNCTION TEST
DL/VA % pred: 110 %
DL/VA: 4.54 ml/min/mmHg/L
DLCO unc % pred: 93 %
DLCO unc: 19.38 ml/min/mmHg
FEF 25-75 Post: 2.12 L/s
FEF 25-75 Pre: 2.38 L/s
FEF2575-%Change-Post: -11 %
FEF2575-%Pred-Post: 102 %
FEF2575-%Pred-Pre: 115 %
FEV1-%Change-Post: -3 %
FEV1-%Pred-Post: 94 %
FEV1-%Pred-Pre: 97 %
FEV1-Post: 2.35 L
FEV1-Pre: 2.43 L
FEV1FVC-%Change-Post: -4 %
FEV1FVC-%Pred-Pre: 105 %
FEV6-%Change-Post: 0 %
FEV6-%Pred-Post: 96 %
FEV6-%Pred-Pre: 96 %
FEV6-Post: 3.04 L
FEV6-Pre: 3.02 L
FEV6FVC-%Change-Post: 0 %
FEV6FVC-%Pred-Post: 104 %
FEV6FVC-%Pred-Pre: 104 %
FVC-%Change-Post: 0 %
FVC-%Pred-Post: 92 %
FVC-%Pred-Pre: 92 %
FVC-Post: 3.04 L
FVC-Pre: 3.02 L
Post FEV1/FVC ratio: 77 %
Post FEV6/FVC ratio: 100 %
Pre FEV1/FVC ratio: 80 %
Pre FEV6/FVC Ratio: 100 %
RV % pred: 77 %
RV: 1.76 L
TLC % pred: 89 %
TLC: 4.77 L

## 2024-01-25 NOTE — Progress Notes (Signed)
 Full PFT completed today ? ?

## 2024-01-25 NOTE — Patient Instructions (Signed)
 Full PFT completed today ? ?

## 2024-01-26 ENCOUNTER — Ambulatory Visit: Admitting: Pulmonary Disease

## 2024-01-26 ENCOUNTER — Encounter: Payer: Self-pay | Admitting: Pulmonary Disease

## 2024-01-26 VITALS — BP 110/60 | HR 91 | Temp 97.9°F | Ht 66.0 in | Wt 165.0 lb

## 2024-01-26 DIAGNOSIS — Z87440 Personal history of urinary (tract) infections: Secondary | ICD-10-CM

## 2024-01-26 DIAGNOSIS — J984 Other disorders of lung: Secondary | ICD-10-CM | POA: Diagnosis not present

## 2024-01-26 DIAGNOSIS — T451X5A Adverse effect of antineoplastic and immunosuppressive drugs, initial encounter: Secondary | ICD-10-CM | POA: Diagnosis not present

## 2024-01-26 NOTE — Progress Notes (Unsigned)
 Subjective:    Patient ID: [name], female    DOB: Aug 16, 1955, 68 y.o.   MRN: [id number]  Patient Care Team: Tisovec, [name], MD as PCP - General (Internal Medicine) [name], MD as PCP - Cardiology (Cardiology) [name], MD as Consulting Physician (Hematology) [name], MD as Consulting Physician (General Surgery) [name], MD as Consulting Physician (Radiation Oncology) [name], RN as Oncology Nurse Navigator [name], MD as Consulting Physician (Radiation Oncology) Tamea Dedra CROME, MD as Consulting Physician (Pulmonary Disease) Gaston Hamilton, MD as Consulting Physician (Urology)  Chief Complaint  Patient presents with   Medical Management of Chronic Issues    BACKGROUND/INTERVAL: Initially seen here for August 2025. Patient is a 68 year old who presents for evaluation of pulmonary infiltrates and shortness of breath on exertion in the setting of Herceptin  therapy for breast cancer.  Patient noted to have findings consistent with trastuzumab  (Herceptin ) pneumonitis.  She completed a steroid course, last seen on 02 December 2023.  She has recovered fully without sequela.  HPI Discussed the use of AI scribe software for clinical note transcription with the patient, who gave verbal consent to proceed.  History of Present Illness   [name] is a 68 year old female who presents for follow-up of a reaction to Herceptin .  Pulmonary function test were performed 25 January 2024 and these were normal, and a chest CT from January 16, 2024, showed significant improvement in inflammation, although some inflammation remains.  However the improvement is dramatic.    She is on Macrodantin (nitrofurantoin) daily for urinary tract infections (UTIs).  I discussed with the patient the concern of this medication which can cause significant issues with lung toxicity. She recalls being previously on Cipro  (ciprofloxacin ).  She has been  having a recurrent UTI despite Macrodantin therapy. She does not take cranberry extract daily but is considering starting it.  She has had a flu shot recently, which she received at [facility].   She does not endorse any other symptomatology.   Review of Systems A 10 point review of systems was performed and it is as noted above otherwise negative.   Patient Active Problem List   Diagnosis Date Noted   Community acquired pneumonia 10/12/2023   Port-A-Cath in place 02/23/2023   CHEK2 gene mutation positive 11/12/2022   Genetic testing 11/12/2022   Malignant neoplasm of upper-outer quadrant of right breast in female, estrogen receptor positive (HCC) 11/01/2022   Neoplasm of uncertain behavior of thyroid  gland 06/19/2019   Multiple thyroid  nodules 06/19/2019   Unstable angina (HCC) 06/15/2018   Chest pain    Plantar fibromatosis 09/09/2015    Social History   Tobacco Use   Smoking status: Never   Smokeless tobacco: Never  Substance Use Topics   Alcohol use: No    No Known Allergies  Current Meds  Medication Sig   anastrozole  (ARIMIDEX ) 1 MG tablet Take 1 tablet (1 mg total) by mouth daily.   aspirin  EC 81 MG tablet Take 81 mg by mouth daily.   ferrous sulfate  325 (65 FE) MG tablet Take 325 mg by mouth daily.   Insulin  Glargine (BASAGLAR  KWIKPEN) 100 UNIT/ML SOPN Inject 35 Units into the skin at bedtime.   insulin  lispro (HUMALOG) 100 UNIT/ML injection Inject 10-15 Units into the skin 3 (three) times daily before meals. Sliding scale   levothyroxine  (SYNTHROID ) 112 MCG tablet Take 112 mcg by mouth daily before breakfast.   metFORMIN  (GLUCOPHAGE ) 1000 MG tablet Take  1,000 mg by mouth 2 (two) times daily with a meal.   metoprolol  succinate (TOPROL  XL) 50 MG 24 hr tablet Take 1 tablet (50 mg total) by mouth daily.   nitrofurantoin (MACRODANTIN) 100 MG capsule Take 100 mg by mouth daily.   Nutritional Supplements (JUICE PLUS FIBRE PO) Take 3 tablets by mouth daily. Juice Plus Vegetable    Nutritional Supplements (JUICE PLUS FIBRE PO) Take 3 tablets by mouth daily. Juice Plus Fruit   ramipril  (ALTACE ) 10 MG capsule Take 1 capsule (10 mg total) by mouth daily with breakfast.   rosuvastatin  (CRESTOR ) 20 MG tablet Take 20 mg by mouth daily with supper.    triamcinolone  cream (KENALOG ) 0.1 % Apply 1 Application topically daily as needed (dermatitis).    Immunization History  Administered Date(s) Administered   PFIZER(Purple Top)SARS-COV-2 Vaccination 05/10/2019, 06/06/2019        Objective:     BP 110/60   Pulse 91   Temp 97.9 F (36.6 C) (Temporal)   Ht 5' 6 (1.676 m)   Wt 165 lb (74.8 kg)   SpO2 97%   BMI 26.63 kg/m   SpO2: 97 %  GENERAL: Well-developed, well-nourished woman, no acute distress, fully ambulatory, no conversational dyspnea. HEAD: Normocephalic, atraumatic.  EYES: Pupils equal, round, reactive to light.  No scleral icterus.  MOUTH: Dentition intact, oral mucosa moist.  No thrush. NECK: Supple. No thyromegaly. Trachea midline. No JVD.  No adenopathy. PULMONARY: Good air entry bilaterally.  No adventitious sounds. CARDIOVASCULAR: S1 and S2. Regular rate and rhythm.  No rubs, murmurs or gallops heard. ABDOMEN: Benign. MUSCULOSKELETAL: No joint deformity, no clubbing, no edema.  NEUROLOGIC: No overt focal deficit, no gait disturbance, speech is fluent. SKIN: Intact,warm,dry. PSYCH: Mood and behavior normal.  Independently reviewed CT chest from 16 January 2024, there is marked improvement on the previously noted inflammatory changes in the lung.  Reviewed patient's PFTs, these were normal.  Recent Results (from the past 2160 hours)  Ferritin     Status: None   Collection Time: 11/08/23 10:39 AM  Result Value Ref Range   Ferritin 45 11 - 307 ng/mL    Comment: Performed at [facility], [address], [place], [place] [address]  CMP (Cancer Center only)     Status: Abnormal   Collection Time: 11/08/23 10:39 AM  Result  Value Ref Range   Sodium 134 (L) 135 - 145 mmol/L   Potassium 4.2 3.5 - 5.1 mmol/L   Chloride 96 (L) 98 - 111 mmol/L   CO2 31 22 - 32 mmol/L   Glucose, Bld 50 (L) 70 - 99 mg/dL    Comment: Glucose reference range applies only to samples taken after fasting for at least 8 hours.   BUN 8 8 - 23 mg/dL   Creatinine 9.43 9.55 - 1.00 mg/dL   Calcium  9.8 8.9 - 10.3 mg/dL   Total Protein 7.0 6.5 - 8.1 g/dL   Albumin 4.1 3.5 - 5.0 g/dL   AST 9 (L) 15 - 41 U/L   ALT 9 0 - 44 U/L   Alkaline Phosphatase 66 38 - 126 U/L   Total Bilirubin 0.5 0.0 - 1.2 mg/dL   GFR, Estimated >39 >39 mL/min    Comment: (NOTE) Calculated using the CKD-EPI Creatinine Equation (2021)    Anion gap 7 5 - 15    Comment: Performed at [facility], [address] [place], [place] [address]  CBC with Differential (Cancer Center Only)  Status: Abnormal   Collection Time: 11/08/23 10:39 AM  Result Value Ref Range   WBC Count 12.6 (H) 4.0 - 10.5 K/uL   RBC 4.94 3.87 - 5.11 MIL/uL   Hemoglobin 12.7 12.0 - 15.0 g/dL   HCT 61.2 63.9 - 53.9 %   MCV 78.3 (L) 80.0 - 100.0 fL   MCH 25.7 (L) 26.0 - 34.0 pg   MCHC 32.8 30.0 - 36.0 g/dL   RDW 81.3 (H) 88.4 - 84.4 %   Platelet Count 421 (H) 150 - 400 K/uL   nRBC 0.0 0.0 - 0.2 %   Neutrophils Relative % 85 %   Neutro Abs 10.6 (H) 1.7 - 7.7 K/uL   Lymphocytes Relative 8 %   Lymphs Abs 1.0 0.7 - 4.0 K/uL   Monocytes Relative 4 %   Monocytes Absolute 0.5 0.1 - 1.0 K/uL   Eosinophils Relative 0 %   Eosinophils Absolute 0.0 0.0 - 0.5 K/uL   Basophils Relative 1 %   Basophils Absolute 0.1 0.0 - 0.1 K/uL   Immature Granulocytes 2 %   Abs Immature Granulocytes 0.27 (H) 0.00 - 0.07 K/uL    Comment: Performed at [facility], [address] [place], [place] [address]  Ferritin     Status: None   Collection Time: 01/03/24  8:03 AM  Result Value Ref Range   Ferritin 43 11 - 307 ng/mL    Comment: Performed at [facility], [address], [place], [place] [address]  CMP (Cancer Center only)     Status: Abnormal   Collection Time: 01/03/24  8:03 AM  Result Value Ref Range   Sodium 138 135 - 145 mmol/L   Potassium 3.9 3.5 - 5.1 mmol/L   Chloride 99 98 - 111 mmol/L   CO2 31 22 - 32 mmol/L   Glucose, Bld 219 (H) 70 - 99 mg/dL    Comment: Glucose reference range applies only to samples taken after fasting for at least 8 hours.   BUN 8 8 - 23 mg/dL   Creatinine 9.47 9.55 - 1.00 mg/dL   Calcium  10.3 8.9 - 10.3 mg/dL   Total Protein 7.4 6.5 - 8.1 g/dL   Albumin 4.2 3.5 - 5.0 g/dL   AST 8 (L) 15 - 41 U/L   ALT 7 0 - 44 U/L   Alkaline Phosphatase 88 38 - 126 U/L   Total Bilirubin 0.3 0.0 - 1.2 mg/dL   GFR, Estimated >39 >39 mL/min    Comment: (NOTE) Calculated using the CKD-EPI Creatinine Equation (2021)    Anion gap 8 5 - 15    Comment: Performed at [facility], [address], [place], [place] [address]  CBC with Differential (Cancer Center Only)     Status: Abnormal   Collection Time: 01/03/24  8:03 AM  Result Value Ref Range   WBC Count 6.1 4.0 - 10.5 K/uL   RBC 4.64 3.87 - 5.11 MIL/uL   Hemoglobin 12.6 12.0 - 15.0 g/dL   HCT 62.6 63.9 - 53.9 %   MCV 80.4 80.0 - 100.0 fL   MCH 27.2 26.0 - 34.0 pg   MCHC 33.8 30.0 - 36.0 g/dL   RDW 82.3 (H) 88.4 - 84.4 %   Platelet Count 431 (H) 150 - 400 K/uL   nRBC 0.0 0.0 - 0.2 %   Neutrophils Relative % 64 %   Neutro Abs 4.0 1.7 - 7.7 K/uL   Lymphocytes Relative 17 %  Lymphs Abs 1.0 0.7 - 4.0 K/uL   Monocytes Relative 10 %   Monocytes Absolute 0.6 0.1 - 1.0 K/uL   Eosinophils Relative 6 %   Eosinophils Absolute 0.4 0.0 - 0.5 K/uL   Basophils Relative 2 %   Basophils Absolute 0.1 0.0 - 0.1 K/uL   Immature Granulocytes 1 %   Abs Immature Granulocytes 0.05 0.00 - 0.07 K/uL    Comment: Performed at [facility], [address], [place], [place] [address]  POCT URINE DIPSTICK     Status:  Abnormal   Collection Time: 01/12/24 12:51 PM  Result Value Ref Range   Color, UA yellow yellow   Clarity, UA cloudy (A) clear   Glucose, UA negative negative mg/dL   Bilirubin, UA negative negative   Ketones, POC UA negative negative mg/dL   Spec Grav, UA 8.989 8.989 - 1.025   Blood, UA trace-intact (A) negative   pH, UA 6.0 5.0 - 8.0   POC PROTEIN,UA negative negative, trace   Urobilinogen, UA 0.2 0.2 or 1.0 E.U./dL   Nitrite, UA Negative Negative   Leukocytes, UA Moderate (2+) (A) Negative  Urine Culture     Status: Abnormal   Collection Time: 01/12/24 12:51 PM   Specimen: Urine, Clean Catch  Result Value Ref Range   Specimen Description URINE, CLEAN CATCH    Special Requests      NONE Performed at [facility], [address], [place], [place] [address]    Culture 20,000 COLONIES/mL PSEUDOMONAS AERUGINOSA (A)    Report Status 01/14/2024 FINAL    Organism ID, Bacteria PSEUDOMONAS AERUGINOSA (A)       Susceptibility   Pseudomonas aeruginosa - MIC*    MEROPENEM <=0.25 SENSITIVE Sensitive     CIPROFLOXACIN  0.12 SENSITIVE Sensitive     IMIPENEM 2 SENSITIVE Sensitive     PIP/TAZO Value in next row Sensitive      16 SENSITIVEThis is a modified FDA-approved test that has been validated and its performance characteristics determined by the reporting laboratory.  This laboratory is certified under the Clinical Laboratory Improvement Amendments CLIA as qualified to perform high complexity clinical laboratory testing.    CEFEPIME Value in next row Sensitive      16 SENSITIVEThis is a modified FDA-approved test that has been validated and its performance characteristics determined by the reporting laboratory.  This laboratory is certified under the Clinical Laboratory Improvement Amendments CLIA as qualified to perform high complexity clinical laboratory testing.    CEFTAZIDIME/AVIBACTAM Value in next row Sensitive      16 SENSITIVEThis is a modified FDA-approved test that has been  validated and its performance characteristics determined by the reporting laboratory.  This laboratory is certified under the Clinical Laboratory Improvement Amendments CLIA as qualified to perform high complexity clinical laboratory testing.    CEFTOLOZANE/TAZOBACTAM Value in next row Sensitive      16 SENSITIVEThis is a modified FDA-approved test that has been validated and its performance characteristics determined by the reporting laboratory.  This laboratory is certified under the Clinical Laboratory Improvement Amendments CLIA as qualified to perform high complexity clinical laboratory testing.    TOBRAMYCIN Value in next row Sensitive      16 SENSITIVEThis is a modified FDA-approved test that has been validated and its performance characteristics determined by the reporting laboratory.  This laboratory is certified under the Clinical Laboratory Improvement Amendments CLIA as qualified to perform high complexity clinical laboratory testing.    CEFTAZIDIME Value in next  row Sensitive      16 SENSITIVEThis is a modified FDA-approved test that has been validated and its performance characteristics determined by the reporting laboratory.  This laboratory is certified under the Clinical Laboratory Improvement Amendments CLIA as qualified to perform high complexity clinical laboratory testing.    * 20,000 COLONIES/mL PSEUDOMONAS AERUGINOSA  Pulmonary function test     Status: None   Collection Time: 01/25/24  9:47 AM  Result Value Ref Range   FVC-Pre 3.02 L   FVC-%Pred-Pre 92 %   FVC-Post 3.04 L   FVC-%Pred-Post 92 %   FVC-%Change-Post 0 %   FEV1-Pre 2.43 L   FEV1-%Pred-Pre 97 %   FEV1-Post 2.35 L   FEV1-%Pred-Post 94 %   FEV1-%Change-Post -3 %   FEV6-Pre 3.02 L   FEV6-%Pred-Pre 96 %   FEV6-Post 3.04 L   FEV6-%Pred-Post 96 %   FEV6-%Change-Post 0 %   Pre FEV1/FVC ratio 80 %   FEV1FVC-%Pred-Pre 105 %   Post FEV1/FVC ratio 77 %   FEV1FVC-%Change-Post -4 %   Pre FEV6/FVC Ratio 100 %    FEV6FVC-%Pred-Pre 104 %   Post FEV6/FVC ratio 100 %   FEV6FVC-%Pred-Post 104 %   FEV6FVC-%Change-Post 0 %   FEF 25-75 Pre 2.38 L/sec   FEF2575-%Pred-Pre 115 %   FEF 25-75 Post 2.12 L/sec   FEF2575-%Pred-Post 102 %   FEF2575-%Change-Post -11 %   RV 1.76 L   RV % pred 77 %   TLC 4.77 L   TLC % pred 89 %   DLCO unc 19.38 ml/min/mmHg   DLCO unc % pred 93 %   DL/VA 5.45 ml/min/mmHg/L   DL/VA % pred 889 %     Assessment & Plan:     ICD-10-CM   1. Pneumonitis  J98.4     2. Adverse effect of trastuzumab  infusion  T45.1X5A     3. History of recurrent UTIs  Z87.440      Discussion:    Pulmonary inflammation due to medication reaction Pulmonary inflammation has significantly improved with normal pulmonary function tests and reduced inflammation on recent chest CT. Continued improvement is anticipated. - Continue monitoring pulmonary function and inflammation.  Recurrent urinary tract infection Concerns about Macrodantin contributing to lung inflammation. Previous use of ciprofloxacin  noted. Potential benefit of cranberry extract and estrogen vaginal cream discussed to prevent recurrent UTIs. - Discuss with urologist about switching from Macrodantin to an alternative antibiotic. - Consider daily cranberry extract. - Consult gynecologist about using estrogen vaginal cream or hyaluronic acid cream to prevent recurrent UTIs.  - Most recent UTI appears to be due to Pseudomonas sensitive to ciprofloxacin .      Advised if symptoms do not improve or worsen, to please contact office for sooner follow up or seek emergency care.    I spent 34 minutes of dedicated to the care of this patient on the date of this encounter to include pre-visit review of records, face-to-face time with the patient discussing conditions above, post visit ordering of testing, clinical documentation with the electronic health record, making appropriate referrals as documented, and communicating necessary findings  to members of the patients care team.     C. Leita Sanders, MD Advanced Bronchoscopy PCCM [facility]    *This note was generated using voice recognition software/Dragon and/or AI transcription program.  Despite best efforts to proofread, errors can occur which can change the meaning. Any transcriptional errors that result from this process are unintentional and may not be fully corrected at the time of  dictation.

## 2024-01-26 NOTE — Patient Instructions (Addendum)
 VISIT SUMMARY:  You had a follow-up appointment to discuss your reaction to a chemotherapeutic agent (trastuzumab ). Your lung function tests were normal, and a recent chest CT showed significant improvement in inflammation. You are currently taking Macrodantin for urinary tract infections and are concerned about its potential to cause lung inflammation. You also mentioned considering starting cranberry extract daily. Additionally, you recently received a flu shot.  YOUR PLAN:  -PULMONARY INFLAMMATION DUE TO MEDICATION REACTION: Pulmonary inflammation is when your lungs become swollen and irritated, often due to a reaction to medication. Your lung function has significantly improved, and recent tests show reduced inflammation. We will continue to monitor your lung function and inflammation to ensure ongoing improvement.  -RECURRENT URINARY TRACT INFECTION: A urinary tract infection (UTI) is an infection in any part of your urinary system. You are concerned that Macrodantin, which you take daily to prevent UTIs, might be causing lung inflammation. We discussed the potential benefits of switching to a different antibiotic, using daily cranberry extract, and consulting with a gynecologist about using estrogen vaginal cream or hyaluronic acid cream to prevent recurrent UTIs.  INSTRUCTIONS:  Please discuss with your primary care physician about switching from Macrodantin to an alternative antibiotic. Consider starting daily cranberry extract. Also, consult with your gynecologist about using estrogen vaginal cream or hyaluronic acid cream to help prevent recurrent urinary tract infections.

## 2024-01-27 ENCOUNTER — Encounter: Payer: Self-pay | Admitting: Pulmonary Disease

## 2024-02-01 ENCOUNTER — Ambulatory Visit: Admitting: Radiation Oncology

## 2024-02-05 ENCOUNTER — Encounter: Payer: Self-pay | Admitting: Hematology

## 2024-03-12 ENCOUNTER — Encounter: Payer: Self-pay | Admitting: Radiation Oncology

## 2024-03-12 ENCOUNTER — Ambulatory Visit
Admission: RE | Admit: 2024-03-12 | Discharge: 2024-03-12 | Disposition: A | Source: Ambulatory Visit | Attending: Radiation Oncology | Admitting: Radiation Oncology

## 2024-03-12 ENCOUNTER — Ambulatory Visit: Admitting: Radiation Oncology

## 2024-03-12 VITALS — BP 130/72 | HR 85 | Temp 97.2°F | Resp 15 | Ht 66.0 in

## 2024-03-12 DIAGNOSIS — Z79811 Long term (current) use of aromatase inhibitors: Secondary | ICD-10-CM | POA: Diagnosis not present

## 2024-03-12 DIAGNOSIS — Z1722 Progesterone receptor negative status: Secondary | ICD-10-CM | POA: Diagnosis not present

## 2024-03-12 DIAGNOSIS — Z170 Estrogen receptor positive status [ER+]: Secondary | ICD-10-CM

## 2024-03-12 DIAGNOSIS — D0511 Intraductal carcinoma in situ of right breast: Secondary | ICD-10-CM | POA: Diagnosis present

## 2024-03-12 DIAGNOSIS — Z1732 Human epidermal growth factor receptor 2 negative status: Secondary | ICD-10-CM | POA: Diagnosis not present

## 2024-03-12 DIAGNOSIS — Z923 Personal history of irradiation: Secondary | ICD-10-CM | POA: Diagnosis not present

## 2024-03-12 NOTE — Progress Notes (Signed)
 Radiation Oncology Follow up Note  Name: [name]   Date:   03/12/2024 MRN:  [id number] DOB: Jan 05, 1956    This 68 y.o. female presents to the clinic today for 65-month follow-up status post whole breast radiation to her right breast for stage Ia (pT1a N0 M0) ER positive PR negative HER2/neu overexpressed invasive mammary carcinoma status post wide local excision and adjuvant chemotherapy.  REFERRING PROVIDER: Tisovec, [name], MD  HPI: Patient is a 68 year old female now out 7 months having completed whole breast radiation to her right breast for stage Ia ER positive HER2/neu overexpressed invasive mammary carcinoma.  Seen today in routine follow-up she is doing well..  Back in July she did develop pneumonitis which was thought to be secondary to her trastuzumab  treatment which has been discontinued.  She is currently on Arimidex  tolerating it well without side effect.  She had mammograms back in July which I have reviewed were BI-RADS Category 2 benign she also had a CT scan of her chest back in October which showed some significant improved multifocal hemorrhages ground glass and consolidative airspace disease with some residual areas of airspace disease noted.  COMPLICATIONS OF TREATMENT: none  FOLLOW UP COMPLIANCE: keeps appointments   PHYSICAL EXAM:  BP 130/72   Pulse 85   Temp (!) 97.2 F (36.2 C) (Tympanic)   Resp 15   Ht 5' 6 (1.676 m)   BMI 26.63 kg/m  Lungs are clear to A&P cardiac examination essentially unremarkable with regular rate and rhythm. No dominant mass or nodularity is noted in either breast in 2 positions examined. Incision is well-healed. No axillary or supraclavicular adenopathy is appreciated. Cosmetic result is excellent.  Well-developed well-nourished patient in NAD. HEENT reveals PERLA, EOMI, discs not visualized.  Oral cavity is clear. No oral mucosal lesions are identified. Neck is clear without evidence of cervical or supraclavicular adenopathy.  Lungs are clear to A&P. Cardiac examination is essentially unremarkable with regular rate and rhythm without murmur rub or thrill. Abdomen is benign with no organomegaly or masses noted. Motor sensory and DTR levels are equal and symmetric in the upper and lower extremities. Cranial nerves II through XII are grossly intact. Proprioception is intact. No peripheral adenopathy or edema is identified. No motor or sensory levels are noted. Crude visual fields are within normal range.  RADIOLOGY RESULTS: CT scan of mammogram reviewed compatible with above-stated findings  PLAN: Present time patient is doing well with no evidence of disease now out 7 months after whole breast radiation I am pleased with her overall progress.  She continues on Arimidex  without side effect.  I will see her back in 6 months for follow-up and then start once a year follow-up visits.  Patient knows to call with any concerns.  I would like to take this opportunity to thank you for allowing me to participate in the care of your patient.SABRA Marcey Penton, MD

## 2024-04-09 ENCOUNTER — Encounter: Payer: Self-pay | Admitting: Cardiology

## 2024-04-09 ENCOUNTER — Ambulatory Visit: Attending: Cardiology | Admitting: Cardiology

## 2024-04-09 VITALS — BP 116/70 | HR 81 | Ht 66.0 in | Wt 168.0 lb

## 2024-04-09 DIAGNOSIS — Z170 Estrogen receptor positive status [ER+]: Secondary | ICD-10-CM

## 2024-04-09 DIAGNOSIS — E782 Mixed hyperlipidemia: Secondary | ICD-10-CM | POA: Diagnosis not present

## 2024-04-09 DIAGNOSIS — J984 Other disorders of lung: Secondary | ICD-10-CM | POA: Diagnosis not present

## 2024-04-09 DIAGNOSIS — I25118 Atherosclerotic heart disease of native coronary artery with other forms of angina pectoris: Secondary | ICD-10-CM

## 2024-04-09 DIAGNOSIS — I10 Essential (primary) hypertension: Secondary | ICD-10-CM

## 2024-04-09 DIAGNOSIS — E118 Type 2 diabetes mellitus with unspecified complications: Secondary | ICD-10-CM

## 2024-04-09 DIAGNOSIS — C50411 Malignant neoplasm of upper-outer quadrant of right female breast: Secondary | ICD-10-CM | POA: Diagnosis not present

## 2024-04-09 DIAGNOSIS — I5020 Unspecified systolic (congestive) heart failure: Secondary | ICD-10-CM

## 2024-04-09 NOTE — Patient Instructions (Addendum)
 Medication Instructions:  Your physician recommends that you continue on your current medications as directed. Please refer to the Current Medication list given to you today.  *If you need a refill on your cardiac medications before your next appointment, please call your pharmacy*  Lab Work:  We will obtain a copy of your labs from your PCP.  If you have labs (blood work) drawn today and your tests are completely normal, you will receive your results only by: MyChart Message (if you have MyChart) OR A paper copy in the mail If you have any lab test that is abnormal or we need to change your treatment, we will call you to review the results.  Testing/Procedures:  No test ordered today   Follow-Up: At [facility], you and your health needs are our priority.  As part of our continuing mission to provide you with exceptional heart care, our providers are all part of one team.  This team includes your primary Cardiologist (physician) and Advanced Practice Providers or APPs (Physician Assistants and Nurse Practitioners) who all work together to provide you with the care you need, when you need it.  Your next appointment:  6 month(s)  Provider:   You may see Deatrice Cage, MD or one of the following Advanced Practice Providers on your designated Care Team:    Tylene Lunch, NP   We recommend signing up for the patient portal called MyChart.  Sign up information is provided on this After Visit Summary.  MyChart is used to connect with patients for Virtual Visits (Telemedicine).  Patients are able to view lab/test results, encounter notes, upcoming appointments, etc.  Non-urgent messages can be sent to your provider as well.   To learn more about what you can do with MyChart, go to [email or web address].

## 2024-04-09 NOTE — Progress Notes (Unsigned)
 " Cardiology Office Note   Date:  04/09/2024  ID:  [name], DOB [birth year], MRN [id number] PCP: Vernadine [name], MD  [facility] Providers Cardiologist:  Deatrice Cage, MD { Click to update primary MD,subspecialty MD or APP then REFRESH:1}    History of Present Illness [name] is a 69 y.o. 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   She was hospitalized in March 2020 with atypical chest pain.  Echocardiogram showed normal LV systolic function and no regional wall motion abnormalities.  CT of the chest was negative for pulmonary embolism but did show coronary and aortic calcifications as well as small pulmonary nodules.  She underwent stress testing which was negative for ischemia. Pulmonary nodules have been stable on repeat CT imaging.  She was found to have thyroid  nodules and underwent thyroidectomy on March 2021.  Pathology was benign and she had been continued on thyroid  replacement therapy.  She had screening mammogram completed in July 2024 which showed a 1.7 cm group of indeterminate calcification in the upper outer right breast.  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  And PR was negative.  She underwent lumpectomy without nodal removal.  She subsequently had 10 nodes removed in October when she had a Chemo-Port placed.  She went underwent chemotherapy for breast cancer and completed 6 treatments with 6 treatments for ago.  It was recommended she would be off of treatments for proxy 1 month and undergo a month of radiation.  She had previously been seen in clinic 03/14/2023 doing fairly well from a cardiac perspective.  She had an echocardiogram which revealed an LVEF of 50-55%, no RWMA, G1 DD, mild MR.  She was advised at that time she would need to undergo repeat echocardiograms while  she was undergoing treatment.  She was last seen in clinic 09/16/2023 after finish chemotherapy, radiation, now is on immunotherapy.  She had also recently been treated for UTI and was on Cipro  and then Macrobid .  She also had follow-up appointment with [facility] prior to her shot to have the labs completed at that time.  She was scheduled for an updated limited study to reassess her LVEF.  She had been referred to advanced heart failure clinic from her oncologist due to cardiotoxicity.  She was last seen by advanced heart failure 01/19/2024.  She was feeling much better since last visit steroids been tapered off and she felt like her breathing was back to normal.  Ramipril  was increased to 10 mg daily.  She had no plans on restarting trastruzumab given reactions.  She was also to continue to follow-up with pulmonary for prior pneumonitis.  Repeat echocardiogram was already scheduled for 05/07/2024.   She returns to clinic today  ROS: 10 point review of system has been reviewed and considered negative the exception was been listed in the HPI  Studies Reviewed EKG Interpretation Date/Time:  Monday April 09 2024 14:31:29 EST Ventricular Rate:  81 PR Interval:  116 QRS Duration:  84 QT Interval:  364 QTC Calculation: 422 R Axis:   4  Text Interpretation: Normal sinus rhythm with sinus arrhythmia Low voltage QRS Nonspecific ST abnormality When compared with ECG of 12-Oct-2023 11:09, Confirmed by Gerard Frederick ([id number]) on 04/09/2024 2:35:38 PM    Limited 2D echo 07/07/2023 1. Left ventricular ejection fraction, by estimation, is 45 to 50%. The  left  ventricle has mildly decreased function. The left ventricle has no  regional wall motion abnormalities. Left ventricular diastolic parameters  are consistent with Grade I  diastolic dysfunction (impaired relaxation).   2. Right ventricular systolic function is normal. The right ventricular  size is normal. Tricuspid regurgitation signal is  inadequate for assessing  PA pressure.   3. The mitral valve is normal in structure. No evidence of mitral valve  regurgitation. No evidence of mitral stenosis.   4. The aortic valve is normal in structure. Aortic valve regurgitation is  not visualized. No aortic stenosis is present.   5. The inferior vena cava is normal in size with greater than 50%  respiratory variability, suggesting right atrial pressure of 3 mmHg.    2D echo 04/06/2023 1. Left ventricular ejection fraction, by estimation, is 45 to 50%. Left  ventricular ejection fraction by PLAX is 49 %. The left ventricle has  moderately decreased function. The left ventricle demonstrates global  hypokinesis. Left ventricular diastolic  parameters are consistent with Grade I diastolic dysfunction (impaired  relaxation).   2. Right ventricular systolic function is normal. The right ventricular  size is normal. Tricuspid regurgitation signal is inadequate for assessing  PA pressure.   3. The mitral valve is normal in structure. Mild mitral valve  regurgitation. No evidence of mitral stenosis.   4. The aortic valve is normal in structure. There is mild calcification  of the aortic valve. Aortic valve regurgitation is not visualized. Aortic  valve sclerosis is present, with no evidence of aortic valve stenosis.   5. There is mild dilatation of the ascending aorta, measuring 40 mm.   6. The inferior vena cava is normal in size with greater than 50%  respiratory variability, suggesting right atrial pressure of 3 mmHg.    2D echo 12/23/2022 1. Left ventricular ejection fraction, by estimation, is 50 to 55%. The  left ventricle has low normal function. The left ventricle has no regional  wall motion abnormalities. The left ventricular internal cavity size was  mildly dilated. Left ventricular  diastolic parameters are consistent with Grade I diastolic dysfunction  (impaired relaxation). The average left ventricular global longitudinal   strain is -13.9 %.   2. Right ventricular systolic function is normal. The right ventricular  size is normal. Tricuspid regurgitation signal is inadequate for assessing  PA pressure.   3. The mitral valve is normal in structure. Mild mitral valve  regurgitation. No evidence of mitral stenosis.   4. The aortic valve is tricuspid. There is mild calcification of the  aortic valve. Aortic valve regurgitation is not visualized. Aortic valve  sclerosis is present, with no evidence of aortic valve stenosis.   5. There is mild dilatation of the ascending aorta, measuring 40 mm.   6. The inferior vena cava is normal in size with greater than 50%  respiratory variability, suggesting right atrial pressure of 3 mmHg.    Lexiscan  MPI 06/16/2018 Normal pharmacologic myocardial perfusion stress test without significant ischemia or scar. The left ventricular ejection fraction is normal (65%). Attenuation correction CT shows coronary artery and aortic atherosclerotic calcification. This is a low risk study.   2D echo 06/15/2018 1. The left ventricle has normal systolic function with an ejection  fraction of 60-65%. The cavity size was normal. Left ventricular diastolic  Doppler parameters are consistent with impaired relaxation.   2. The right ventricle has normal systolic function. The cavity was  normal. There is no increase in right ventricular wall thickness.  Normal  RVSP   Risk Assessment/Calculations           Physical Exam VS:  BP 116/70 (BP Location: Left Arm, Patient Position: Sitting, Cuff Size: Normal)   Pulse 81   Ht 5' 6 (1.676 m)   Wt 168 lb (76.2 kg)   SpO2 100%   BMI 27.12 kg/m        Wt Readings from Last 3 Encounters:  04/09/24 168 lb (76.2 kg)  01/26/24 165 lb (74.8 kg)  01/25/24 166 lb (75.3 kg)    GEN: Well nourished, well developed in no acute distress NECK: No JVD; No carotid bruits CARDIAC: ***RRR, no murmurs, rubs, gallops RESPIRATORY:  Clear to auscultation  without rales, wheezing or rhonchi  ABDOMEN: Soft, non-tender, non-distended EXTREMITIES:  No edema; No deformity   ASSESSMENT AND PLAN Coronary atherosclerosis noted on CT Primary hypertension Mixed hyperlipidemia  Cardiotoxicity to chemotherapy Breast cancer Sinus tachycardia Type 2 diabetes    {Are you ordering a CV Procedure (e.g. stress test, cath, DCCV, TEE, etc)?   Press F2        :[id number]}  Dispo: ***  Signed, Tamecka Milham, NP   "

## 2024-04-11 ENCOUNTER — Inpatient Hospital Stay: Attending: Hematology | Admitting: Nurse Practitioner

## 2024-05-07 ENCOUNTER — Ambulatory Visit

## 2024-05-11 ENCOUNTER — Ambulatory Visit: Admitting: Family

## 2024-06-01 ENCOUNTER — Ambulatory Visit: Admitting: Pulmonary Disease

## 2024-07-04 ENCOUNTER — Other Ambulatory Visit

## 2024-07-04 ENCOUNTER — Ambulatory Visit: Admitting: Nurse Practitioner

## 2024-08-29 ENCOUNTER — Ambulatory Visit: Admitting: Radiation Oncology
